# Patient Record
Sex: Female | Born: 1945 | Race: White | Hispanic: No | Smoking: Former smoker
Health system: Southern US, Community
[De-identification: ages and names within clinical notes are randomized; demographics above are authoritative.]

## PROBLEM LIST (undated history)

## (undated) DIAGNOSIS — J449 Chronic obstructive pulmonary disease, unspecified: Secondary | ICD-10-CM

## (undated) DIAGNOSIS — C189 Malignant neoplasm of colon, unspecified: Secondary | ICD-10-CM

## (undated) DIAGNOSIS — Q2112 Patent foramen ovale: Secondary | ICD-10-CM

## (undated) HISTORY — PX: OTHER SURGICAL HISTORY: SHX169

## (undated) HISTORY — PX: HERNIA REPAIR: SHX51

## (undated) HISTORY — DX: Patent foramen ovale: Q21.12

## (undated) HISTORY — PX: TONSILLECTOMY: SUR1361

---

## 2010-06-02 HISTORY — PX: ABDOMINAL HYSTERECTOMY: SHX81

## 2010-06-02 HISTORY — PX: DILATION AND CURETTAGE OF UTERUS: SHX78

## 2010-06-06 ENCOUNTER — Encounter (INDEPENDENT_AMBULATORY_CARE_PROVIDER_SITE_OTHER): Payer: Self-pay | Admitting: Obstetrics and Gynecology

## 2010-06-06 ENCOUNTER — Inpatient Hospital Stay (HOSPITAL_COMMUNITY)
Admission: RE | Admit: 2010-06-06 | Discharge: 2010-06-09 | Payer: Self-pay | Source: Home / Self Care | Attending: Obstetrics and Gynecology | Admitting: Obstetrics and Gynecology

## 2010-06-07 LAB — CBC
HCT: 35.6 % — ABNORMAL LOW (ref 36.0–46.0)
Hemoglobin: 11.8 g/dL — ABNORMAL LOW (ref 12.0–15.0)
MCH: 30 pg (ref 26.0–34.0)
MCHC: 33.1 g/dL (ref 30.0–36.0)
MCV: 90.6 fL (ref 78.0–100.0)
Platelets: 219 10*3/uL (ref 150–400)
RBC: 3.93 MIL/uL (ref 3.87–5.11)
RDW: 12.1 % (ref 11.5–15.5)
WBC: 15 10*3/uL — ABNORMAL HIGH (ref 4.0–10.5)

## 2010-06-08 IMAGING — CT CT ANGIO CHEST
1 series · 19 of 34 positions shown · IV contrast (OMNIPAQUE)
Comparison: Chest radiographs obtained earlier today.

CLINICAL DATA: Shortness of breath and hypoxia.  Status post
surgery for uterine prolapse on [DATE].  Ex-smoker.

CT ANGIOGRAPHY CHEST WITH CONTRAST
TECHNIQUE: Multidetector CT imaging of the chest was performed
using the standard protocol during bolus administration of
intravenous contrast.  Multiplanar CT image reconstructions
including MIPs were obtained to evaluate the vascular anatomy.
Contrast:  200 ml [UB].  100 ml was given twice due to
scanner failure during the first injection.

[Series 6: pe chest · axial · 0.65mm/px · z∈[-200,+100]mm · 19 of 162 slices shown]
[im 6/162  lung]
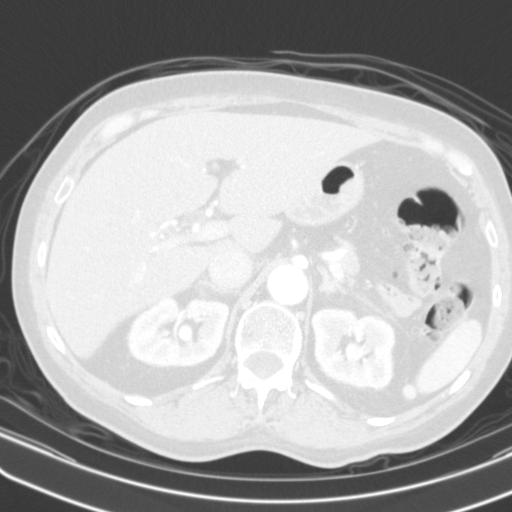
[im 18/162  mediastinal]
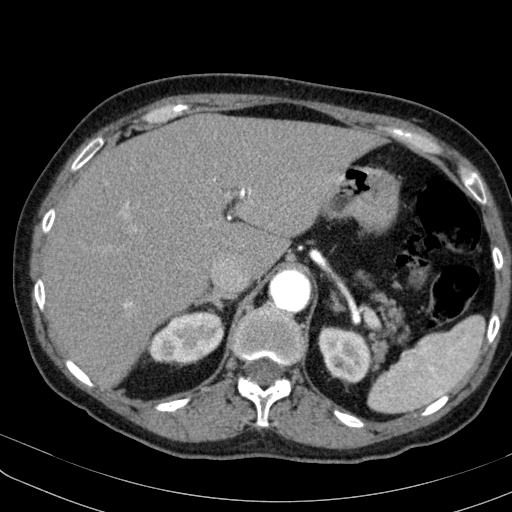
[im 30/162  lung]
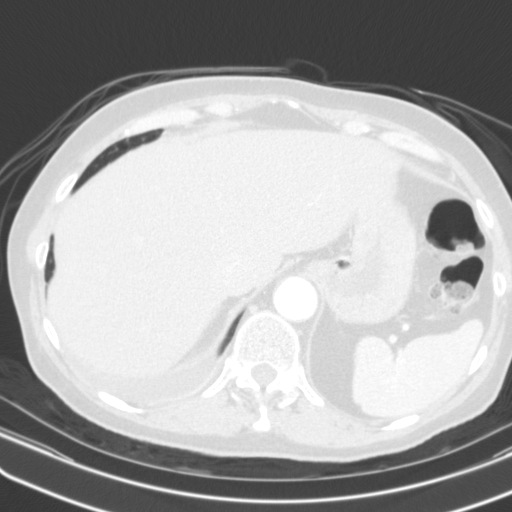
[im 33/162  mediastinal]
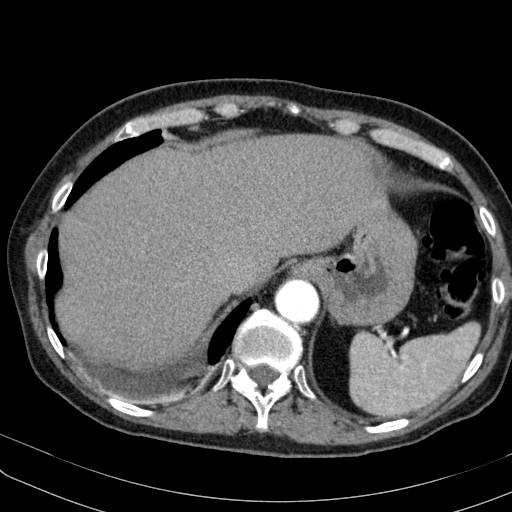
[im 42/162  lung]
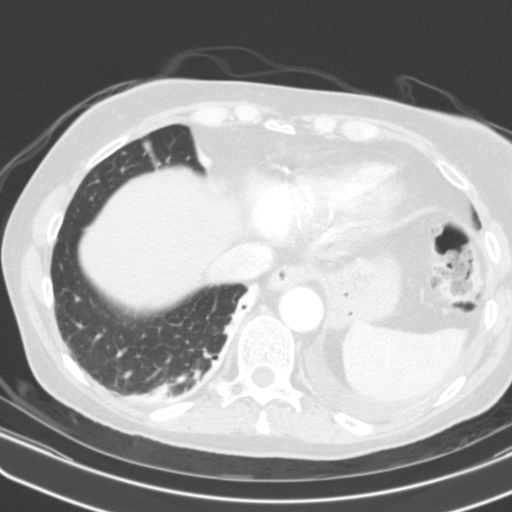
[im 54/162  mediastinal]
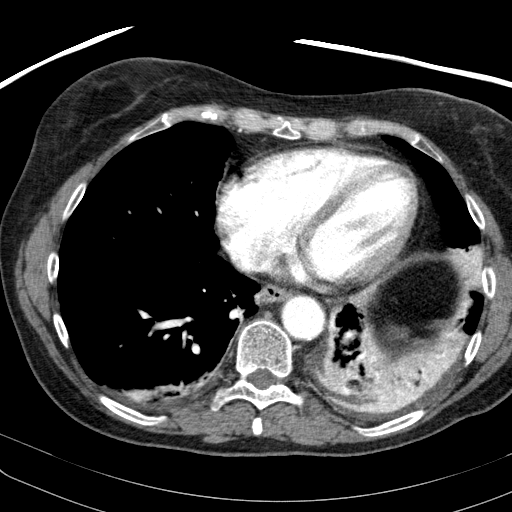
[im 60/162  lung]
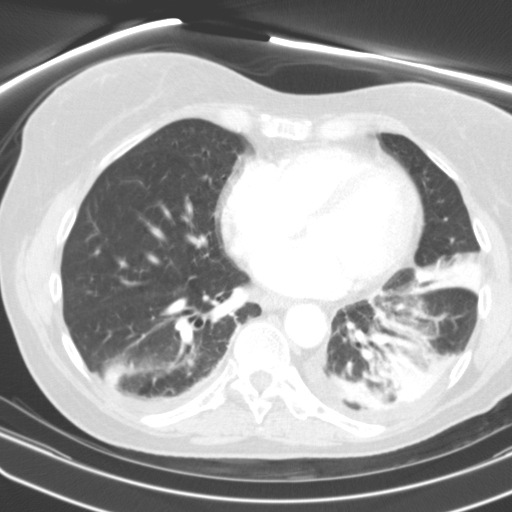
[im 66/162  mediastinal]
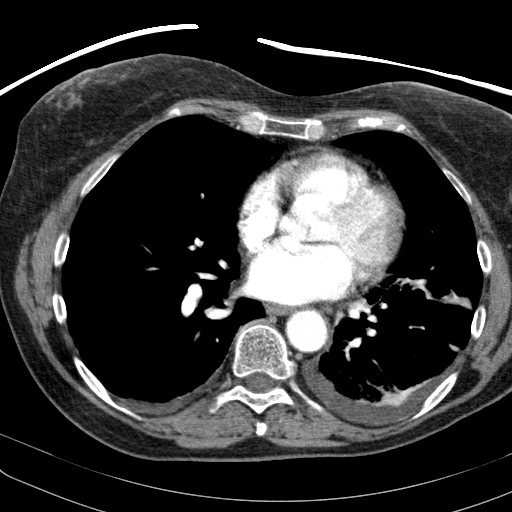
[im 77/162  lung]
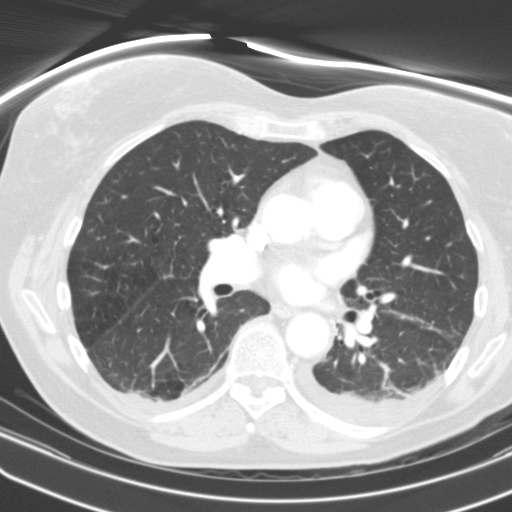
[im 84/162  mediastinal]
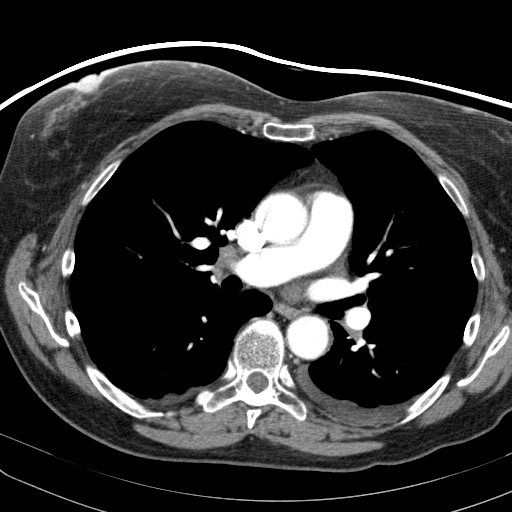
[im 86/162  lung]
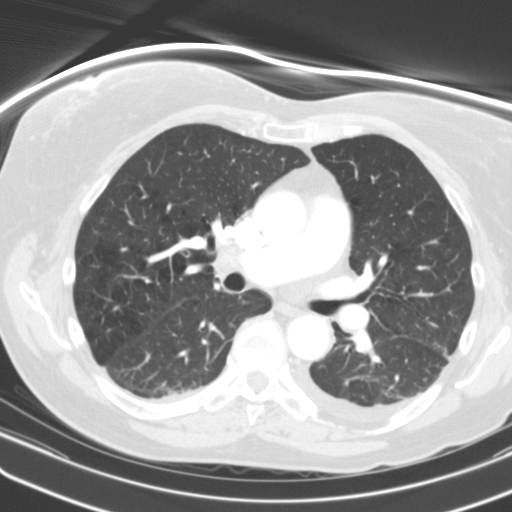
[im 96/162  mediastinal]
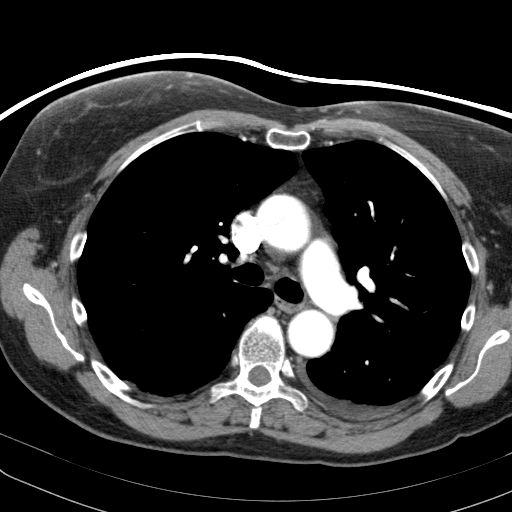
[im 102/162  lung]
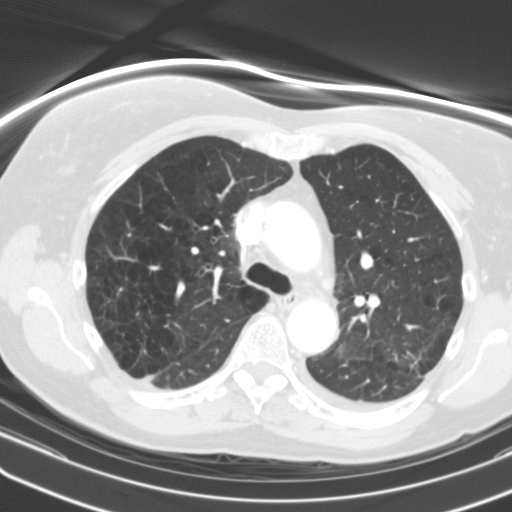
[im 108/162  mediastinal]
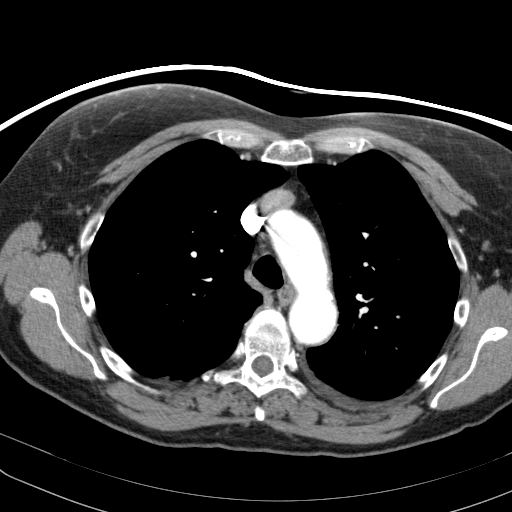
[im 120/162  lung]
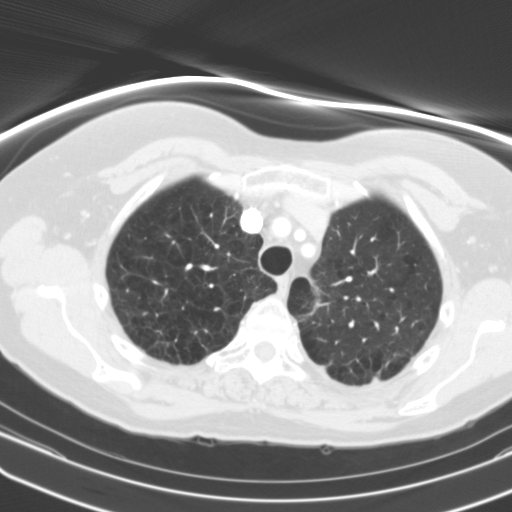
[im 129/162  mediastinal]
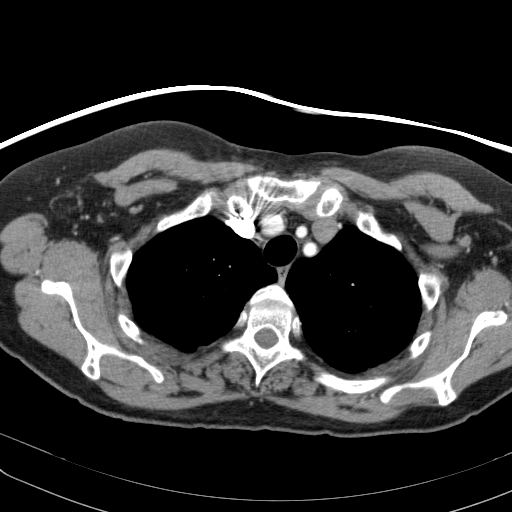
[im 132/162  lung]
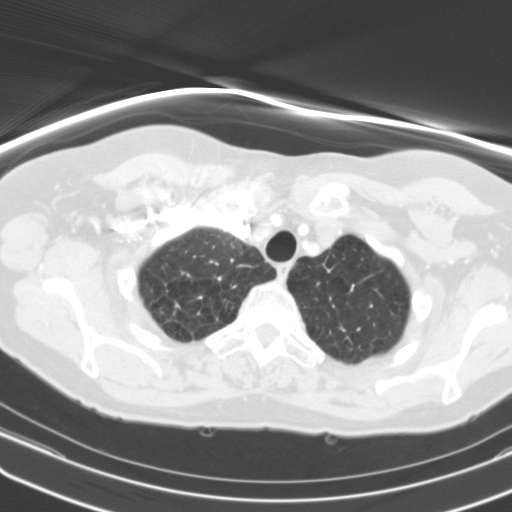
[im 144/162  mediastinal]
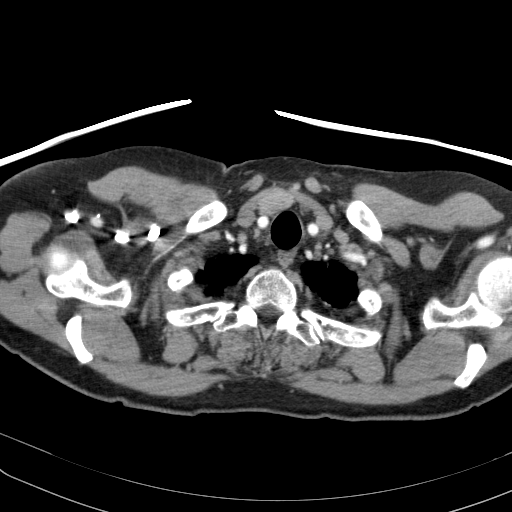
[im 156/162  lung]
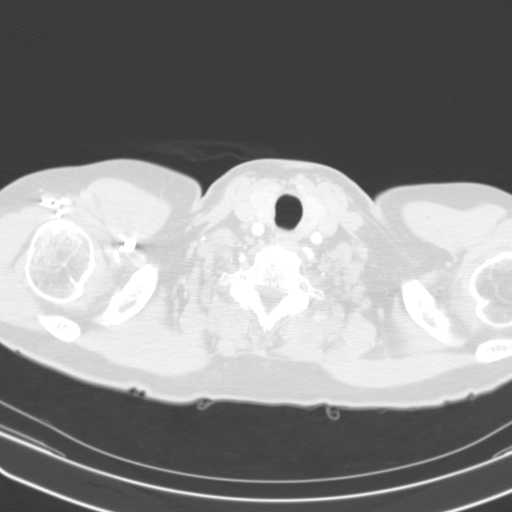

[19 of 34 positions shown; findings below may reference images not displayed]

FINDINGS: Normally opacified pulmonary arteries with no pulmonary
arterial filling defects seen.  Small bilateral pleural effusions,
larger on the left.  Bibasilar atelectasis, greater on the left.
Extensive bullous changes bilaterally, most pronounced in the upper
lobes.  No lung masses or enlarged lymph nodes seen.  Inhomogeneous
thyroid gland containing multiple poorly defined and better defined
nodules.  The largest individual nodule is poorly defined in the
right lobe near the isthmus, measuring 1.6 cm in maximum diameter
on image number 13.  Mild diffuse low density of the liver relative
to the spleen.  Mild thoracic spine degenerative changes.

Review of the MIP images confirms the above findings.
IMPRESSION: 1.  No pulmonary emboli.
2.  Small bilateral pleural effusions, larger on the left.
3.  Bibasilar atelectasis, greater on the left.
4.  COPD.
5.  Multiple thyroid nodules.  These could be better defined with
elective thyroid ultrasound.
6.  Mild hepatic steatosis.

## 2010-06-08 IMAGING — CR DG CHEST 2V
2 series · 2 of 2 positions shown · non-contrast
Comparison: None.

CLINICAL DATA: Uterine prolapse, shallow breathing, previous
tobacco use

CHEST - 2 VIEW

[view not recorded (1 of 2)]
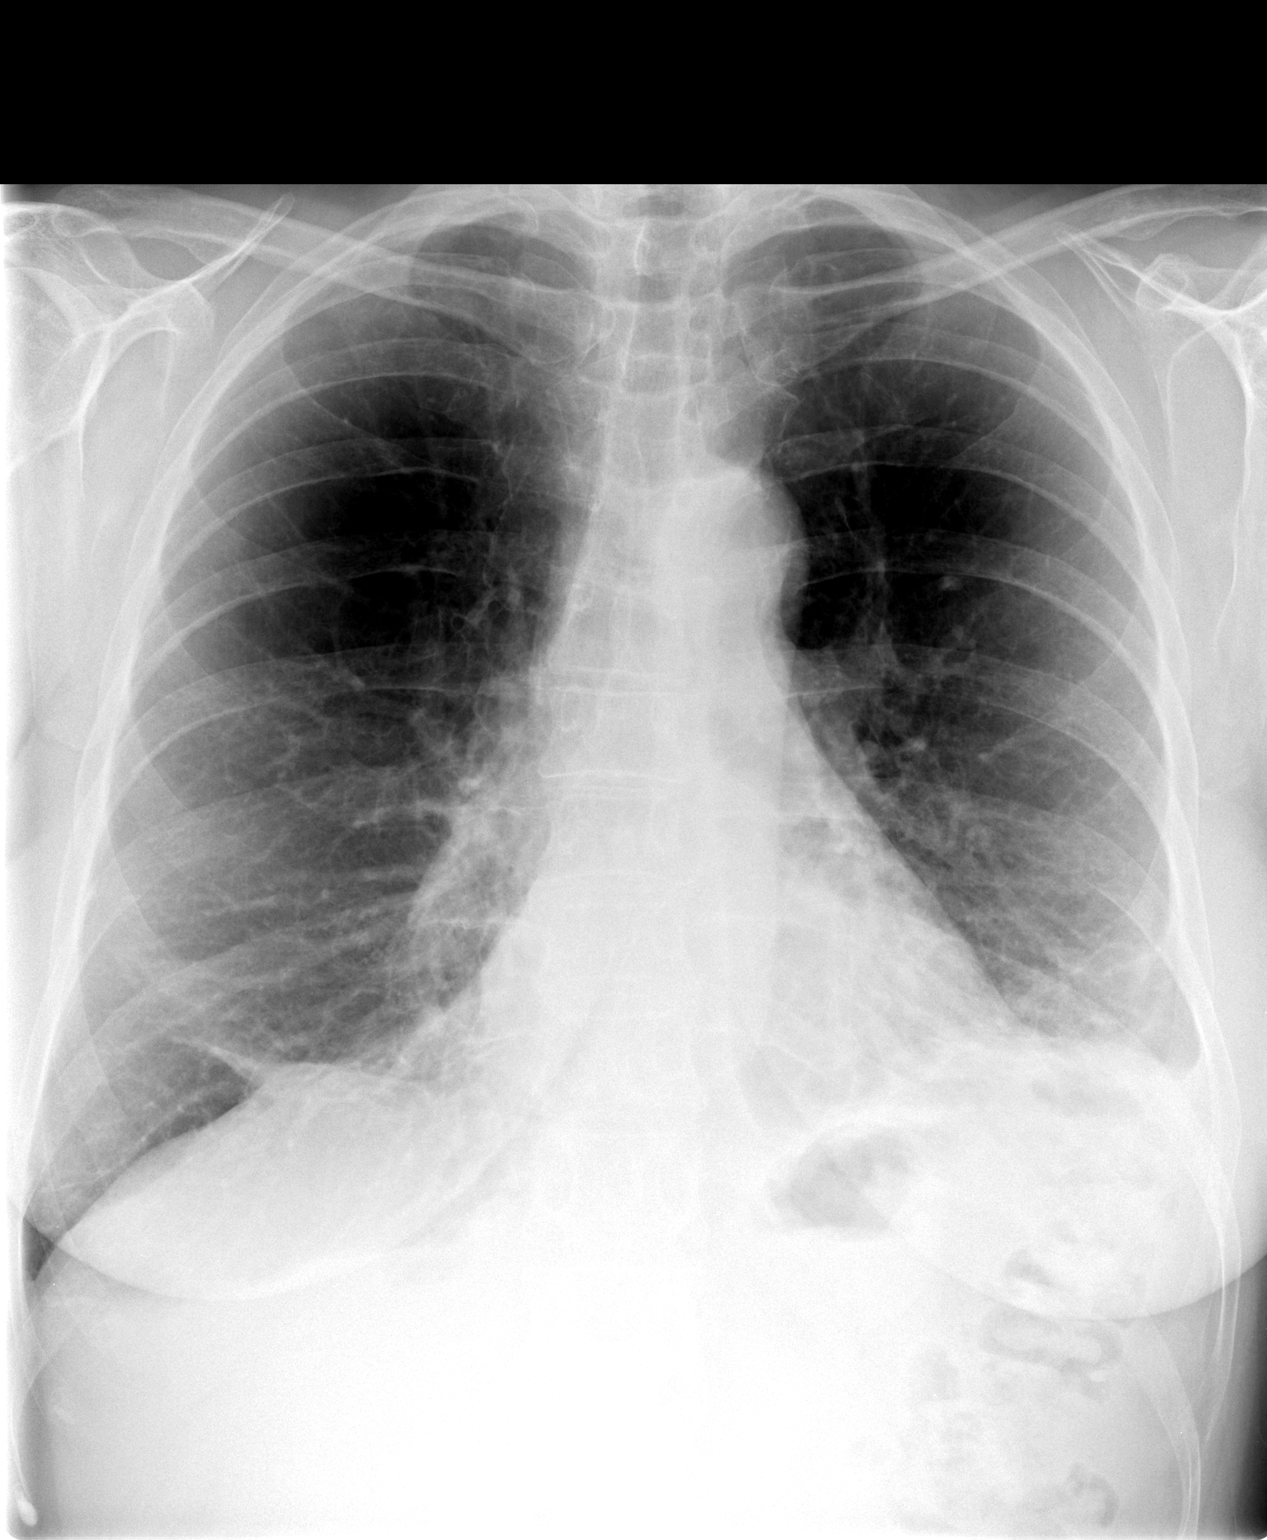

[view not recorded (2 of 2)]
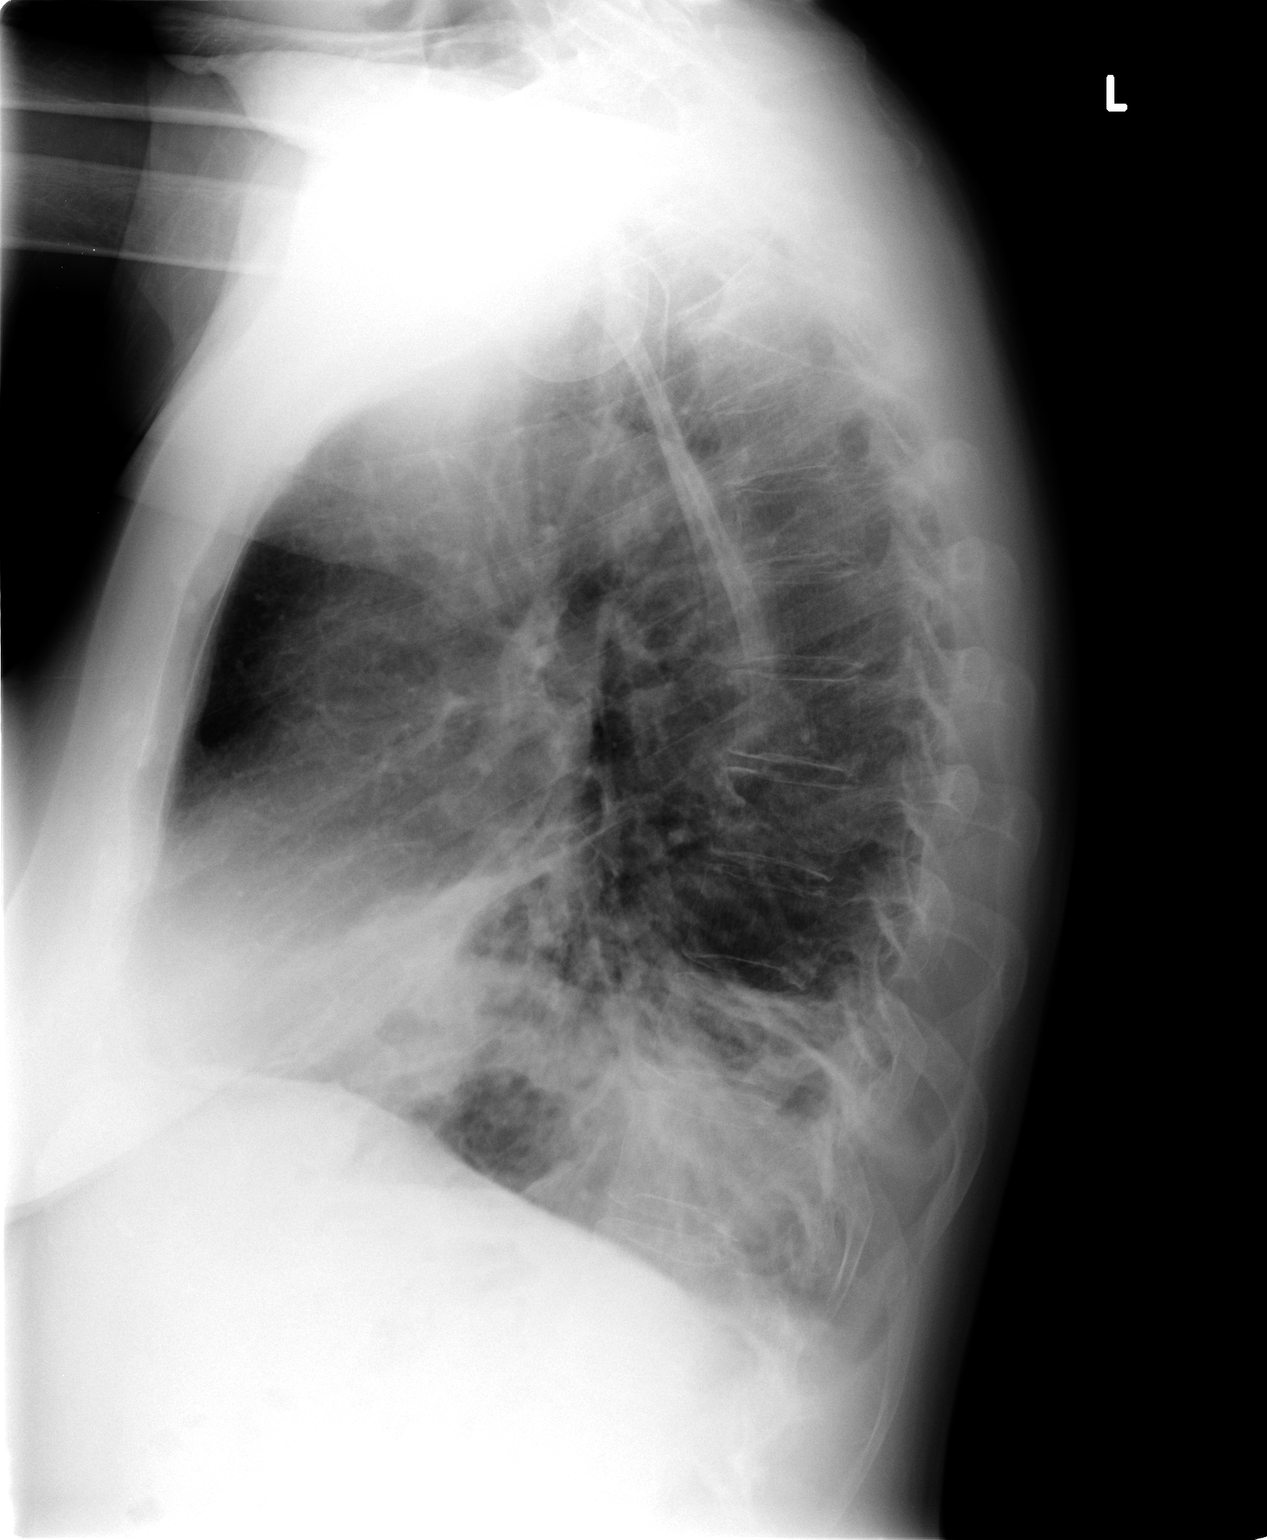

[2 of 2 positions shown; findings below may reference images not displayed]

FINDINGS: Small left pleural effusion.  Patchy consolidation /
atelectasis at the left lung in the left lower lobe.  Linear
subsegmental atelectasis or scarring in the posteromedial right
lower lobe.  Heart size normal.
IMPRESSION: 1.  Small left effusion with adjacent atelectasis or infiltrate in
the left lower lobe basilar segments.
2.  Linear scarring/atelectasis at the right lung base.

## 2010-06-09 IMAGING — CR DG CHEST 2V
2 series · 2 of 2 positions shown · non-contrast
Comparison: [DATE]

CLINICAL DATA: Evaluate pleural effusion

CHEST - 2 VIEW

[view not recorded (1 of 2)]
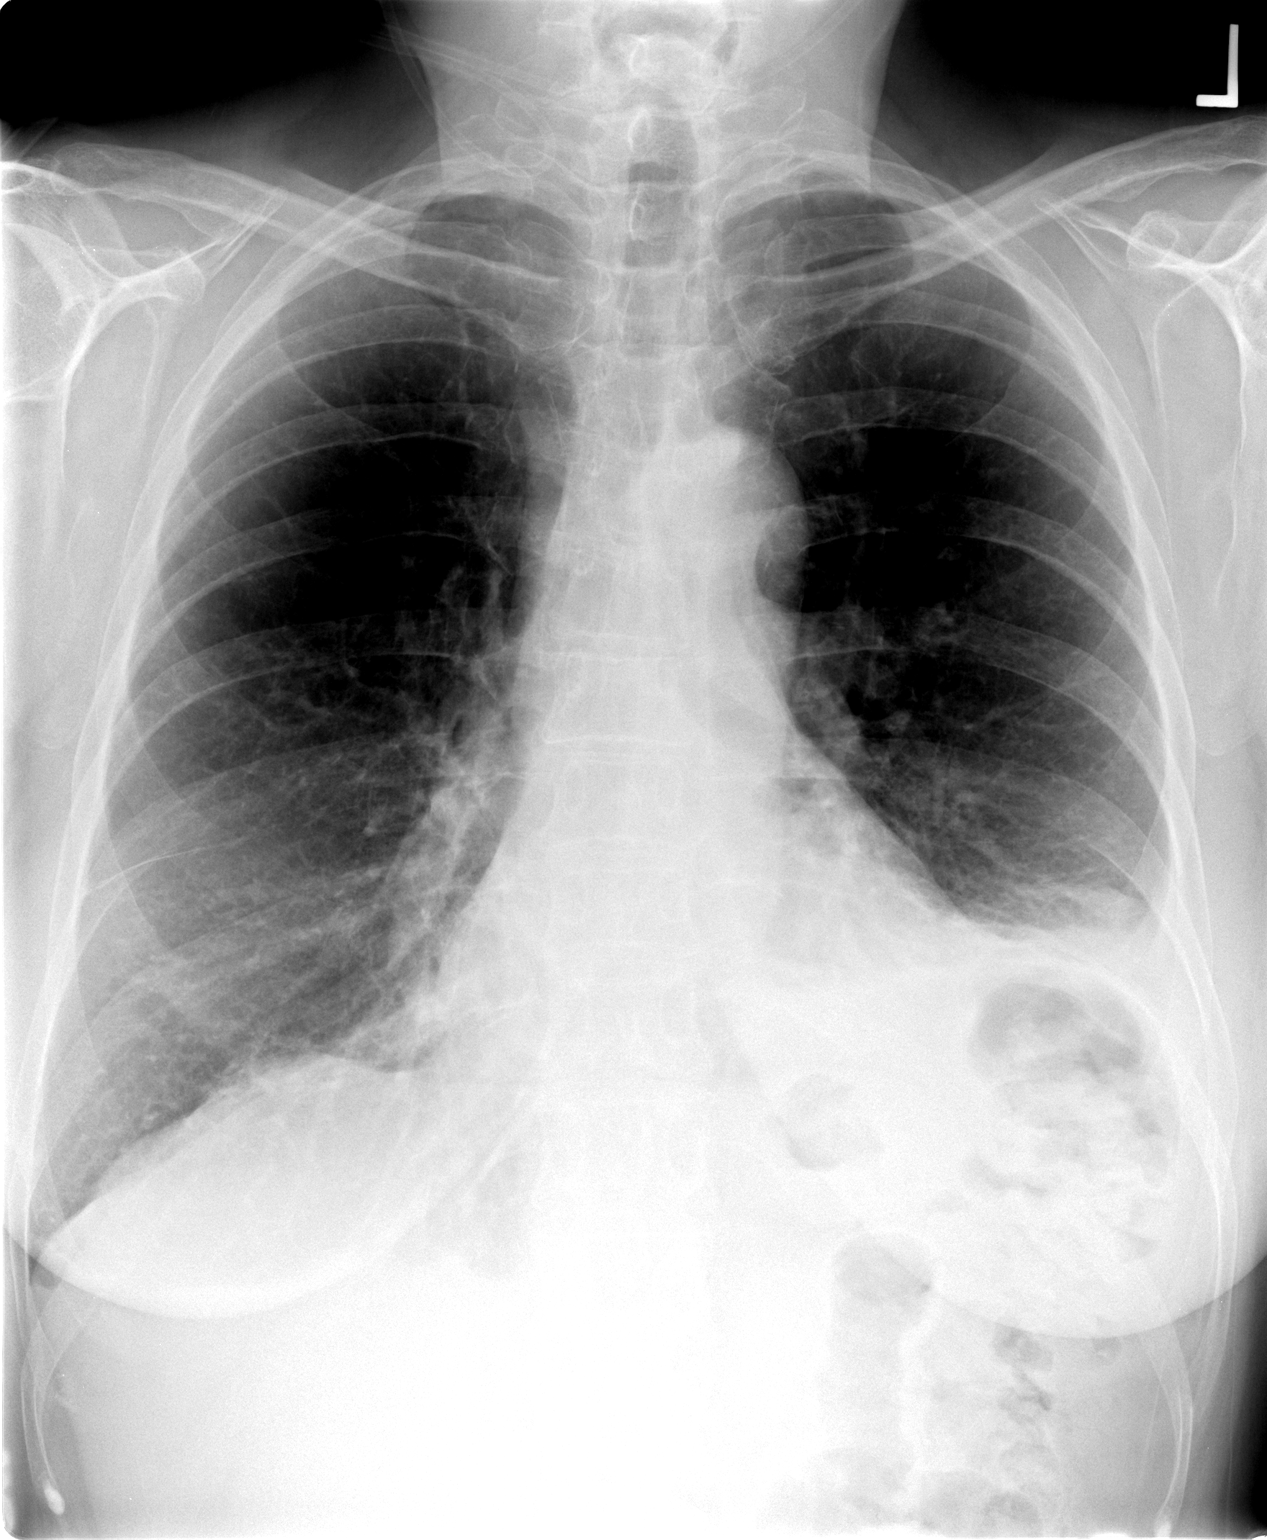

[view not recorded (2 of 2)]
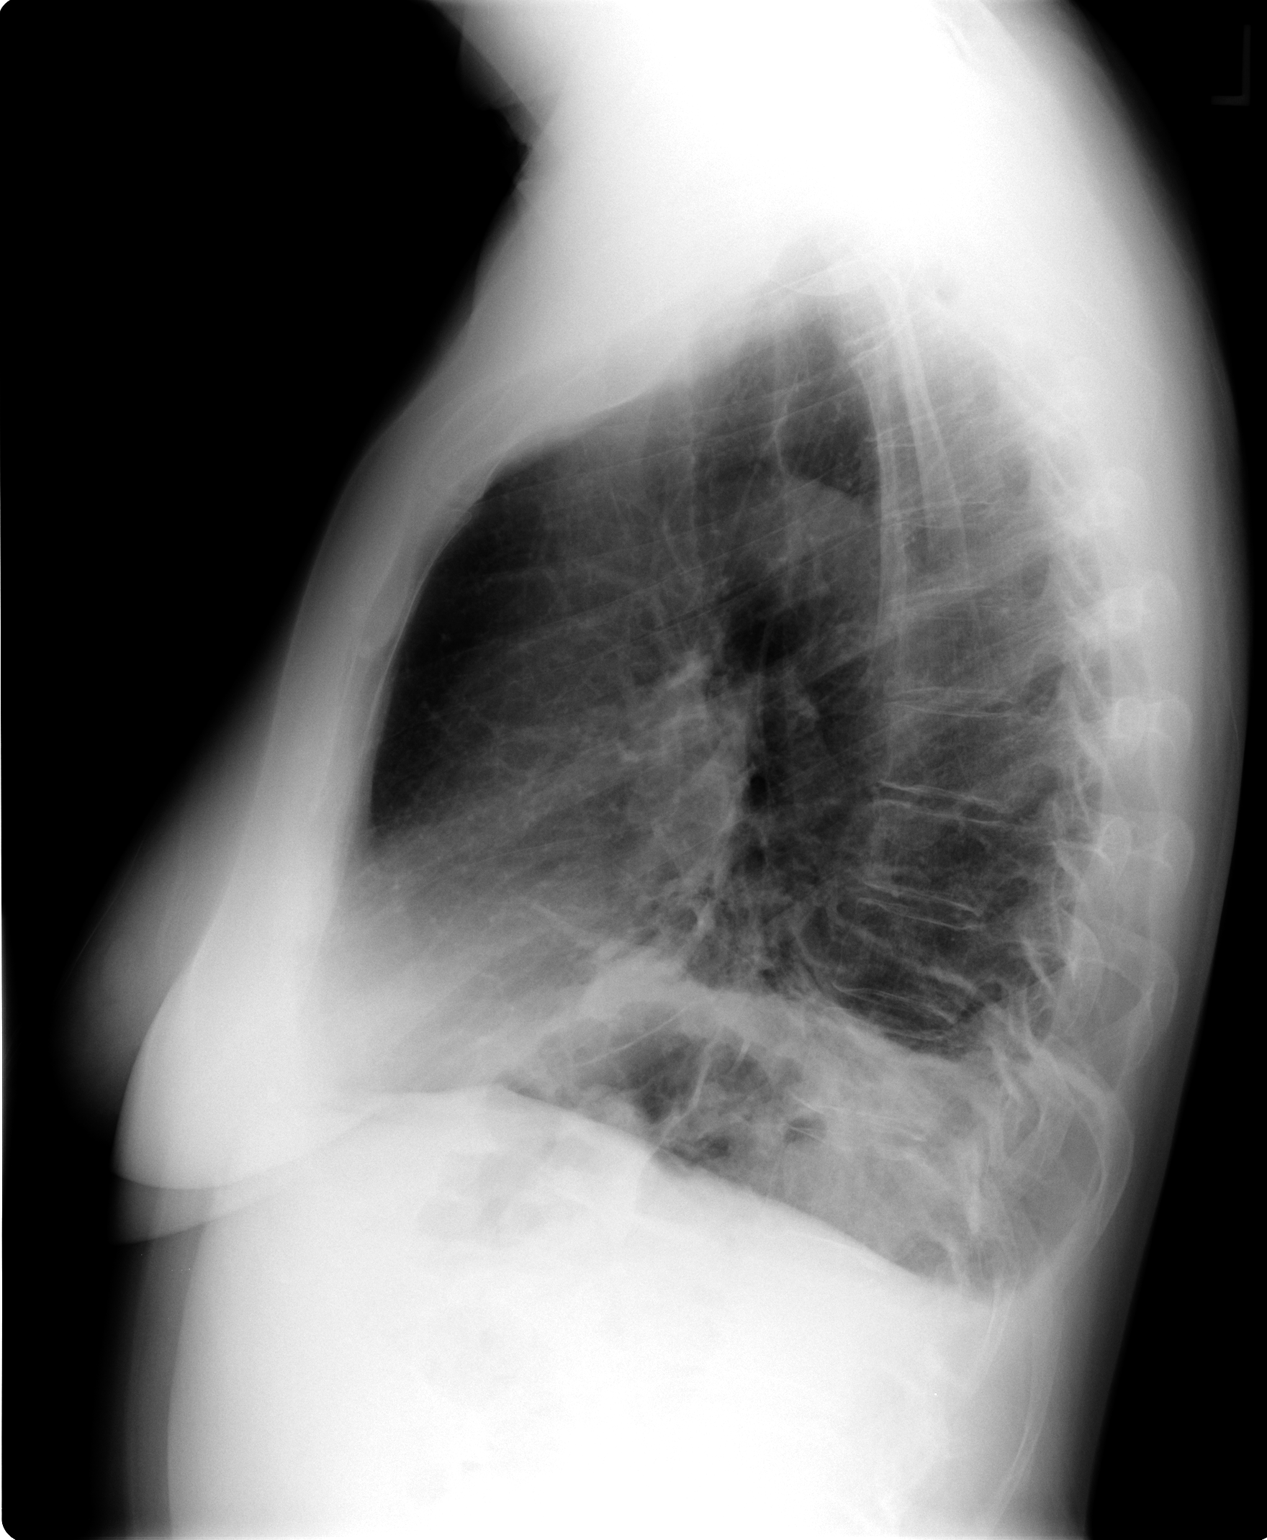

[2 of 2 positions shown; findings below may reference images not displayed]

FINDINGS: Heart size is normal.  Bilateral pleural effusions, left greater
than right, are again noted and appear unchanged from previous
exam.

Airspace consolidation and atelectasis within the left base is
unchanged.  There is mild atelectasis in the right base.  No new
findings identified.
IMPRESSION: 1.  No change in small effusions
2.  Aeration to the lung base stable.

## 2010-06-17 LAB — CREATININE, SERUM
Creatinine, Ser: 0.71 mg/dL (ref 0.4–1.2)
GFR calc Af Amer: >60 mL/min (ref >60)
GFR calc non Af Amer: >60 mL/min (ref >60)

## 2010-06-17 LAB — BUN: BUN: 10 mg/dL (ref 6–23)

## 2010-06-21 NOTE — Consult Note (Signed)
Kelly [name], [name]              ACCOUNT NO.:  [id number]  MEDICAL RECORD NO.:  [id number]          PATIENT TYPE:  INP  LOCATION:  9316                          FACILITY:  [facility]  PHYSICIAN:  Kelly Balm. Sung Amabile, MD   DATE OF BIRTH:  03/27/1946  DATE OF CONSULTATION:  06/09/2010 DATE OF DISCHARGE:  06/09/2010                                CONSULTATION   REQUESTING PHYSICIAN:  Dineen Kid. Kelly Snare, MD  REASON FOR CONSULTATION:  Postoperative hypoxemia.  HISTORY OF PRESENT ILLNESS:  Ms. Kelly [name] is a 65 year old woman who was admitted electively on June 06, 2010, for surgical repair of prolapsed uterus.  She underwent laparoscopic-assisted vaginal hysterectomy, bilateral salpingo-oophorectomy, anterior and posterior colporrhaphy, and sacrospinous vaginal vault suspension.  This was performed by Dr. Rana [name], and the procedure was uncomplicated.  On postoperative day #0, she was ambulating.  On the first day postoperatively, she noted no significant symptoms, but she was noted to require oxygen to maintain saturations greater than 90%.  In particular, while ambulating her oxygen saturations were noted to drop.  On postoperative day #2, she developed some left-sided chest discomfort.  She underwent a CT scan of the chest to rule out pulmonary embolism and the results are discussed below.  She has remained oxygen-dependent since her surgery and consequently Pulmonary Medicine is asked to evaluate her.  At the present time, she has no significant complaints of respiratory distress. She denies further chest pain, pressure tightness, or heaviness.  She has had no productive cough and no significant fevers.  She denies lower extremity edema and calf tenderness.  She does have the expected postoperative pelvic and abdominal pain.  Otherwise, a detailed review of systems is negative.  PAST MEDICAL HISTORY:  She has no chronic medical problems and takes no medications on a regular basis.  She has  undergone inguinal hernia repair and tonsillectomy.  Specifically, there is no history of respiratory disease noted.  She has had no hospitalizations for any pulmonary problems.  SOCIAL HISTORY:  She is a former Engineer, site.  Presently works at [employer]  She lives independently.  She smoked a pack of cigarettes per day for 40 years and quit approximately 2 years ago.  She has no history of significant occupational or environmental exposures.  FAMILY HISTORY:  This has been reviewed as noncontributory.  REVIEW OF SYSTEMS:  As per the history present illness.  PHYSICAL EXAMINATION:  VITAL SIGNS:  Afebrile with normal vital signs, oxygen saturations are 97% on 3 liters by nasal cannula.  When oxygen is removed, her oxygen saturations vary between 85% and 94%.  She can be coached to take several deep breaths which markedly improved her saturations by pulse oximetry. GENERAL:  She is well developed, well nourished in no acute distress. HEENT:  Head and neck exam reveal no acute abnormalities.  There is no jugular venous distention and no lymphadenopathy noted.  Thyroid gland is normal. CHEST:  Reveals a normal percussion note throughout.  Breath sounds are full with a faint, left greater than right, bibasilar crackles.  No wheezes are heard. CARDIAC:  Reveals a regular rate and rhythm with no  murmurs. ABDOMEN:  Soft with lower abdominal tenderness to palpation that is mild.  There is no rebound or tenderness.  Bowel sounds present. EXTREMITIES:  Reveal no clubbing, cyanosis, or edema.  There is no calf tenderness. NEUROLOGIC:  Fully intact.  DATA:  Chest x-ray from June 08, 2010, reveals faint bilateral basilar atelectasis.  Otherwise no acute cardiac or pulmonary disease.  CT scan of the chest performed on June 08, 2010, reveals very small bilateral pleural effusions with bilateral atelectasis and moderate bullous emphysema.  No pulmonary emboli are noted.  IMPRESSION:   Persistent postoperative hypoxemia - this is multifactorial and mostly related to her previously unrecognized emphysema. Superimposed on that is the problem of atelectasis with very, very small pleural effusions.  I also think there is a component of depressed ventilatory drive related to opioid analgesics being used for postoperative pain.  Although, her oxygen saturations are in the mid 80s at times and we would not want her to live with saturations for prolonged period of time, in the short-term that should be of no consequence to her and I think her oxygen saturations will normalize over the next couple of days.  PLAN/RECOMMENDATIONS: 1. She is okay for discharge to home and I do not believe that she     needs oxygen therapy. 2. The best therapy for her is to be ambulating as much as possible     and I discussed this with her. 3. Encourage that she continues to use the incentive spirometer as she     has been previously taught.  She should take this device home and     use it several times throughout the day. 4. I have encouraged that she minimize the use of Percocet or other     opioid analgesics to avoid respiratory depression related induced-     medications. 5. She should follow up her with her primary physician in [facility],     Dr. Alwyn Ren, and upon followup she should receive a repeat chest     x-ray with Dr. Alwyn Ren.  Upon followup, she should receive a     repeat chest x-ray to ensure resolution of the bilateral basilar     atelectasis.  Also recommend a recheck of her oxygen saturations to     ensure that these normalized and to be certain that she does not     require long-term oxygen therapy.  Lastly, given the finding of     emphysema, I recommend full pulmonary function tests.  If she were     to require further pulmonary evaluation or management, my group     would be happy to see her at any time.  If this is desired, please     call [place] [facility] at [phone].     Kelly Balm Sung Amabile, MD     DBS/MEDQ  D:  06/09/2010  T:  06/09/2010  Job:  [id number]  cc:   Doreatha Martin, M.D.  Electronically Signed by Billy Fischer MD on 06/21/2010 11:33:18 AM

## 2010-08-12 LAB — CBC
HCT: 42.6 % (ref 36.0–46.0)
Hemoglobin: 13.7 g/dL (ref 12.0–15.0)
MCH: 29.1 pg (ref 26.0–34.0)
MCHC: 32.2 g/dL (ref 30.0–36.0)
MCV: 90.4 fL (ref 78.0–100.0)
Platelets: 240 10*3/uL (ref 150–400)
RBC: 4.71 MIL/uL (ref 3.87–5.11)
RDW: 12.1 % (ref 11.5–15.5)
WBC: 8.9 10*3/uL (ref 4.0–10.5)

## 2010-08-12 LAB — SURGICAL PCR SCREEN
MRSA, PCR: NEGATIVE
Staphylococcus aureus: POSITIVE — AB

## 2015-10-24 ENCOUNTER — Emergency Department (HOSPITAL_COMMUNITY): Payer: Medicare Other

## 2015-10-24 ENCOUNTER — Encounter (HOSPITAL_COMMUNITY): Payer: Self-pay | Admitting: Emergency Medicine

## 2015-10-24 ENCOUNTER — Inpatient Hospital Stay (HOSPITAL_COMMUNITY)
Admission: EM | Admit: 2015-10-24 | Discharge: 2015-10-27 | DRG: 418 | Disposition: A | Discharge status: Home / Self Care | Payer: Medicare Other | Attending: Internal Medicine | Admitting: Internal Medicine

## 2015-10-24 DIAGNOSIS — J439 Emphysema, unspecified: Secondary | ICD-10-CM

## 2015-10-24 DIAGNOSIS — K838 Other specified diseases of biliary tract: Secondary | ICD-10-CM

## 2015-10-24 DIAGNOSIS — Z419 Encounter for procedure for purposes other than remedying health state, unspecified: Secondary | ICD-10-CM

## 2015-10-24 DIAGNOSIS — Z87891 Personal history of nicotine dependence: Secondary | ICD-10-CM | POA: Diagnosis not present

## 2015-10-24 DIAGNOSIS — K851 Biliary acute pancreatitis without necrosis or infection: Secondary | ICD-10-CM | POA: Diagnosis not present

## 2015-10-24 DIAGNOSIS — Z791 Long term (current) use of non-steroidal anti-inflammatories (NSAID): Secondary | ICD-10-CM | POA: Diagnosis not present

## 2015-10-24 DIAGNOSIS — K8050 Calculus of bile duct without cholangitis or cholecystitis without obstruction: Secondary | ICD-10-CM | POA: Diagnosis not present

## 2015-10-24 DIAGNOSIS — Z882 Allergy status to sulfonamides status: Secondary | ICD-10-CM

## 2015-10-24 DIAGNOSIS — K859 Acute pancreatitis without necrosis or infection, unspecified: Secondary | ICD-10-CM

## 2015-10-24 DIAGNOSIS — Z90710 Acquired absence of both cervix and uterus: Secondary | ICD-10-CM | POA: Diagnosis not present

## 2015-10-24 DIAGNOSIS — J449 Chronic obstructive pulmonary disease, unspecified: Secondary | ICD-10-CM | POA: Diagnosis not present

## 2015-10-24 DIAGNOSIS — K8066 Calculus of gallbladder and bile duct with acute and chronic cholecystitis without obstruction: Secondary | ICD-10-CM | POA: Diagnosis not present

## 2015-10-24 DIAGNOSIS — R1011 Right upper quadrant pain: Secondary | ICD-10-CM | POA: Diagnosis present

## 2015-10-24 DIAGNOSIS — K8020 Calculus of gallbladder without cholecystitis without obstruction: Secondary | ICD-10-CM

## 2015-10-24 DIAGNOSIS — K8051 Calculus of bile duct without cholangitis or cholecystitis with obstruction: Secondary | ICD-10-CM | POA: Diagnosis not present

## 2015-10-24 HISTORY — DX: Calculus of bile duct without cholangitis or cholecystitis without obstruction: K80.50

## 2015-10-24 HISTORY — DX: Biliary acute pancreatitis without necrosis or infection: K85.10

## 2015-10-24 HISTORY — DX: Chronic obstructive pulmonary disease, unspecified: J44.9

## 2015-10-24 LAB — CBC
HCT: 38.7 % (ref 36.0–46.0)
Hemoglobin: 12.9 g/dL (ref 12.0–15.0)
MCH: 29.3 pg (ref 26.0–34.0)
MCHC: 33.3 g/dL (ref 30.0–36.0)
MCV: 87.8 fL (ref 78.0–100.0)
Platelets: 274 10*3/uL (ref 150–400)
RBC: 4.41 MIL/uL (ref 3.87–5.11)
RDW: 12.5 % (ref 11.5–15.5)
WBC: 14.3 10*3/uL — ABNORMAL HIGH (ref 4.0–10.5)

## 2015-10-24 LAB — LIPASE, BLOOD: Lipase: 747 U/L — ABNORMAL HIGH (ref 11–51)

## 2015-10-24 LAB — COMPREHENSIVE METABOLIC PANEL
ALT: 53 U/L (ref 14–54)
AST: 78 U/L — ABNORMAL HIGH (ref 15–41)
Albumin: 3.9 g/dL (ref 3.5–5.0)
Alkaline Phosphatase: 76 U/L (ref 38–126)
Anion gap: 6 (ref 5–15)
BUN: 16 mg/dL (ref 6–20)
CO2: 26 mmol/L (ref 22–32)
Calcium: 8.8 mg/dL — ABNORMAL LOW (ref 8.9–10.3)
Chloride: 108 mmol/L (ref 101–111)
Creatinine, Ser: 1.07 mg/dL — ABNORMAL HIGH (ref 0.44–1.00)
GFR calc Af Amer: >60 mL/min (ref >60)
GFR calc non Af Amer: 52 mL/min — ABNORMAL LOW (ref >60)
Glucose, Bld: 151 mg/dL — ABNORMAL HIGH (ref 65–99)
Potassium: 4 mmol/L (ref 3.5–5.1)
Sodium: 140 mmol/L (ref 135–145)
Total Bilirubin: 1.4 mg/dL — ABNORMAL HIGH (ref 0.3–1.2)
Total Protein: 6.7 g/dL (ref 6.5–8.1)

## 2015-10-24 LAB — URINALYSIS, ROUTINE W REFLEX MICROSCOPIC
Bilirubin Urine: NEGATIVE
Glucose, UA: NEGATIVE mg/dL
Hgb urine dipstick: NEGATIVE
Ketones, ur: NEGATIVE mg/dL
Nitrite: POSITIVE — AB
Protein, ur: NEGATIVE mg/dL
Specific Gravity, Urine: 1.018 (ref 1.005–1.030)
pH: 6.5 (ref 5.0–8.0)

## 2015-10-24 LAB — URINE MICROSCOPIC-ADD ON: RBC / HPF: NONE SEEN RBC/hpf (ref 0–5)

## 2015-10-24 MED ORDER — MORPHINE SULFATE (PF) 4 MG/ML IV SOLN
4.0000 mg | Freq: Once | INTRAVENOUS | Status: AC
  Administered 2015-10-24: 4 mg via INTRAVENOUS
  Filled 2015-10-24: qty 1

## 2015-10-24 MED ORDER — ONDANSETRON HCL 4 MG/2ML IJ SOLN: 4.0000 mg | Freq: Four times a day (QID) | INTRAMUSCULAR | Status: DC | PRN

## 2015-10-24 MED ORDER — PIPERACILLIN-TAZOBACTAM 3.375 G IVPB 30 MIN
3.3750 g | Freq: Once | INTRAVENOUS | Status: AC
  Administered 2015-10-24: 3.375 g via INTRAVENOUS
  Filled 2015-10-24: qty 50

## 2015-10-24 MED ORDER — MORPHINE SULFATE (PF) 2 MG/ML IV SOLN
2.0000 mg | INTRAVENOUS | Status: DC | PRN
Start: 2015-10-24 — End: 2015-10-26

## 2015-10-24 MED ORDER — FENTANYL CITRATE (PF) 100 MCG/2ML IJ SOLN
50.0000 ug | INTRAMUSCULAR | Status: DC | PRN
  Administered 2015-10-24: 50 ug via INTRAVENOUS
  Filled 2015-10-24: qty 2

## 2015-10-24 MED ORDER — ALBUTEROL SULFATE (2.5 MG/3ML) 0.083% IN NEBU: 2.5000 mg | INHALATION_SOLUTION | RESPIRATORY_TRACT | Status: DC | PRN

## 2015-10-24 MED ORDER — SODIUM CHLORIDE 0.9 % IV SOLN
INTRAVENOUS | Status: DC
  Administered 2015-10-24 – 2015-10-26 (×5): via INTRAVENOUS

## 2015-10-24 MED ORDER — OXYCODONE HCL 5 MG PO TABS
5.0000 mg | ORAL_TABLET | ORAL | Status: DC | PRN
  Filled 2015-10-24: qty 1

## 2015-10-24 MED ORDER — ONDANSETRON HCL 4 MG PO TABS: 4.0000 mg | ORAL_TABLET | Freq: Four times a day (QID) | ORAL | Status: DC | PRN

## 2015-10-24 MED ORDER — PIPERACILLIN-TAZOBACTAM 3.375 G IVPB
3.3750 g | Freq: Three times a day (TID) | INTRAVENOUS | Status: DC
  Administered 2015-10-25 – 2015-10-26 (×6): 3.375 g via INTRAVENOUS
  Filled 2015-10-24 (×8): qty 50

## 2015-10-24 NOTE — Consult Note (Signed)
Referring Provider: No ref. provider found Primary Care Physician:  Suzanna Obey, MD Primary Gastroenterologist:  Althia Forts  Reason for Consultation:  Gallstone pancreatitis  HPI: Kelly [name] is a 70 y.o. female with medical history significant of COPD, who presented to the ED today with complaints of persistent but worsening right upper quadrant abdominal pain since she woke up this morning. Pain is described as colicky, [id number] at its worst without any radiation. No particular aggravating or relieving factors. Pain is associated with 2- 3 episodes of nonbilious and nonbloody vomiting. Further workup in the ED revealed choledocholithiasis and gallstone pancreatitis.  Interestingly, for the past 1 year or so, patient has had occasional right upper quadrant abdominal pain that lasts for 10-15 minutes and resolves on its own.  She says that she actually has episodes of this almost weekly.  ED Course: Found to have lipase of 747, leukocytosis of 14,000, abdominal ultrasound showed a dilated CBD of 16.4 mm with evidence of choledocholithiasis.  She is being admitted to the hospitalist service and started on antibiotics.   History reviewed. No pertinent past medical history.  History reviewed. No pertinent past surgical history.  Prior to Admission medications   Medication Sig Start Date End Date Taking? Authorizing Provider  Biotin 5 MG TABS Take 5 mg by mouth daily.   Yes Historical Provider, MD    Current Facility-Administered Medications  Medication Dose Route Frequency Provider Last Rate Last Dose  . fentaNYL (SUBLIMAZE) injection 50 mcg  50 mcg Intravenous Q20 Min PRN Davonna Belling, MD   50 mcg at 10/24/15 1356   Current Outpatient Prescriptions  Medication Sig Dispense Refill  . Biotin 5 MG TABS Take 5 mg by mouth daily.      Allergies as of 10/24/2015 - Review Complete 10/24/2015  Allergen Reaction Noted  . Sulfa antibiotics Rash 10/24/2015    No family history on  file.  Social History   Social History  . Marital Status: Married    Spouse Name: N/A  . Number of Children: N/A  . Years of Education: N/A   Occupational History  . Not on file.   Social History Main Topics  . Smoking status: Former Smoker    Quit date: 06/02/2009  . Smokeless tobacco: Not on file  . Alcohol Use: Not on file  . Drug Use: Not on file  . Sexual Activity: Not on file   Other Topics Concern  . Not on file   Social History Narrative  . No narrative on file    Review of Systems: Ten point ROS is O/W negative except as mentioned in HPI.  Physical Exam: Vital signs in last 24 hours: Temp:  [97.7 F (36.5 C)] 97.7 F (36.5 C) (05/24 1348) Pulse Rate:  [68] 68 (05/24 1348) Resp:  [18] 18 (05/24 1348) BP: (142)/(72) 142/72 mmHg (05/24 1348) SpO2:  [94 %] 94 % (05/24 1348)   General:  Alert, Well-developed, well-nourished, pleasant and cooperative in NAD Head:  Normocephalic and atraumatic. Eyes:  Sclera clear, no icterus.  Conjunctiva pink. Ears:  Normal auditory acuity. Mouth:  No deformity or lesions.   Lungs:  Clear throughout to auscultation.  No wheezes, crackles, or rhonchi.  Heart:  Regular rate and rhythm; no murmurs, clicks, rubs, or gallops. Abdomen:  Soft, non-distended.  BS present.  Mild epigastric TTP.   Rectal:  Deferred.  Msk:  Symmetrical without gross deformities. Pulses:  Normal pulses noted. Extremities:  Without clubbing or edema. Neurologic:  Alert and oriented  x 4;  grossly normal neurologically. Skin:  Intact without significant lesions or rashes. Psych:  Alert and cooperative. Normal mood and affect.  Lab Results:  Recent Labs  10/24/15 1359  WBC 14.3*  HGB 12.9  HCT 38.7  PLT 274   BMET  Recent Labs  10/24/15 1359  NA 140  K 4.0  CL 108  CO2 26  GLUCOSE 151*  BUN 16  CREATININE 1.07*  CALCIUM 8.8*   LFT  Recent Labs  10/24/15 1359  PROT 6.7  ALBUMIN 3.9  AST 78*  ALT 53  ALKPHOS 76  BILITOT  1.4*   Studies/Results: US Abdomen Complete  10/24/2015  CLINICAL DATA:  Right upper quadrant pain for 1 day EXAM: ABDOMEN ULTRASOUND COMPLETE COMPARISON:  None. FINDINGS: Gallbladder: No cholelithiasis. Distended gallbladder. Trace amount of pericholecystic fluid. Normal gallbladder wall thickness measuring 1.5 mm. Common bile duct: Diameter: Dilated common bile duct measuring 16.4 mm with an 8 mm echogenic focus within the common bile duct concerning for choledocholithiasis. Liver: No focal lesion identified. Increased hepatic parenchymal echogenicity. IVC: No abnormality visualized. Pancreas: Visualized portion unremarkable. Spleen: Size and appearance within normal limits. Right Kidney: Length: 10.4 cm. Mild renal cortical thinning. Echogenicity within normal limits. No mass or hydronephrosis visualized. Left Kidney: Length: 10.9 cm. Echogenicity within normal limits. 1.9 x 1.1 x 2.4 cm and anechoic left renal mass most consistent with a cyst. No hydronephrosis visualized. Abdominal aorta: No aneurysm visualized. Other findings: None. IMPRESSION: 1. Dilated common bile duct measuring 16.4 mm with an 8 mm echogenic focus within the common bile duct concerning for choledocholithiasis. 2. Distended gallbladder with a trace amount of pericholecystic fluid. No cholelithiasis. Electronically Signed   By: Kathreen Devoid   On: 10/24/2015 15:53   IMPRESSION:  -70 year old female with what appears to be gallstone pancreatitis with a lipase of 747 and ultrasound showing common bile duct measuring 16.4 mm with suspected choledocholithiasis. -Leukocytosis:  Secondary to above.  PLAN: -Patient being admitted to the hospitalist service to receive IV fluids, bowel rest, pain control, antiemetics. She is being started on antibiotics. Would trend LFTs. She will undergo ERCP with stone removal on 5/25.  ZEHR, JESSICA D.  10/24/2015, 4:18 PM  Pager number  [phone]     ________________________________________________________________________  Velora Heckler GI MD note:  I personally examined the patient, reviewed the data and agree with the assessment and plan described above.  Pretty clear CBD stones, dilated duct on today's [place].  Planning for ERCP tomorrow. She will need surgical consult for eventual GB resection as well.   Owens Loffler, MD [facility] Pager [phone]

## 2015-10-24 NOTE — ED Notes (Signed)
Patient here via EMS with complaints of right upper quadrant abd pain after eating. 1 episode of vomiting. Pain 10/10.

## 2015-10-24 NOTE — ED Provider Notes (Signed)
CSN: [id number]     Arrival date & time 10/24/15  1339 History   First MD Initiated Contact with Patient 10/24/15 1358     Chief Complaint  Patient presents with  . Abdominal Pain     (Consider location/radiation/quality/duration/timing/severity/associated sxs/prior Treatment) HPI   Patient is a 70 year old female with COPD who presents to the ED with right upper quadrant abdominal pain since roughly 10 AM this morning. Patient said she woke this morning with mild RUQ pain that gradually got worse today. She ate a [employer] burger and the pain elevated to 10/10, constant, squeezing sensation, nonradiating with associated 1 episode of vomiting. Patient states she's had intermittent right upper quadrant pain for the past year. Pain occurs roughly once a week and vomiting makes it feel better. She states it is never gotten this bad. She denies history of gastric ulcers, NSAID use, or alcohol abuse. She denies fever, chills, back pain, chest pain, shortness of breath, hematuria, dysuria, changes in bowel habits.  History reviewed. No pertinent past medical history. History reviewed. No pertinent past surgical history. No family history on file. Social History  Substance Use Topics  . Smoking status: Former Smoker    Quit date: 06/02/2009  . Smokeless tobacco: None  . Alcohol Use: None   OB History    No data available     Review of Systems  Constitutional: Negative for fever and chills.  HENT: Negative for trouble swallowing.   Eyes: Negative for visual disturbance.  Respiratory: Negative for chest tightness and shortness of breath.   Gastrointestinal: Positive for vomiting and abdominal pain. Negative for diarrhea, constipation, blood in stool and abdominal distention.  Genitourinary: Negative for dysuria and hematuria.  Musculoskeletal: Negative for back pain and neck pain.  Skin: Negative for rash.  Allergic/Immunologic: Negative for immunocompromised state.  Neurological: Negative  for dizziness, syncope, weakness, light-headedness, numbness and headaches.      Allergies  Sulfa antibiotics  Home Medications   Prior to Admission medications   Medication Sig Start Date End Date Taking? Authorizing Provider  Biotin 5 MG TABS Take 5 mg by mouth daily.   Yes Historical Provider, MD   BP 142/72 mmHg  Pulse 68  Temp(Src) 97.7 F (36.5 C) (Oral)  Resp 18  SpO2 94% Physical Exam  Constitutional: She appears well-developed and well-nourished. No distress.  Patient is restless but in no acute distress.  HENT:  Head: Normocephalic and atraumatic.  Eyes: Conjunctivae are normal.  Cardiovascular: Normal rate and normal heart sounds.  An irregular rhythm present.  Pulses:      Dorsalis pedis pulses are 2+ on the right side, and 2+ on the left side.  Pulmonary/Chest: Effort normal and breath sounds normal. No accessory muscle usage. No respiratory distress. She has no wheezes. She has no rales.  Abdominal: Soft. Normal appearance. She exhibits no distension. Bowel sounds are decreased. There is tenderness in the right upper quadrant. There is guarding and positive Murphy's sign. There is no rigidity.  Musculoskeletal: Normal range of motion. She exhibits no edema.  Neurological: She is alert. Coordination normal.  Skin: Skin is warm and dry. She is not diaphoretic.  Psychiatric: She has a normal mood and affect. Her behavior is normal.    ED Course  Procedures (including critical care time) Labs Review Labs Reviewed  LIPASE, BLOOD - Abnormal; Notable for the following:    Lipase 747 (*)    All other components within normal limits  COMPREHENSIVE METABOLIC PANEL - Abnormal; Notable for  the following:    Glucose, Bld 151 (*)    Creatinine, Ser 1.07 (*)    Calcium 8.8 (*)    AST 78 (*)    Total Bilirubin 1.4 (*)    GFR calc non Af Amer 52 (*)    All other components within normal limits  CBC - Abnormal; Notable for the following:    WBC 14.3 (*)    All other  components within normal limits  URINALYSIS, ROUTINE W REFLEX MICROSCOPIC (NOT AT [facility])    Imaging Review US Abdomen Complete  10/24/2015  CLINICAL DATA:  Right upper quadrant pain for 1 day EXAM: ABDOMEN ULTRASOUND COMPLETE COMPARISON:  None. FINDINGS: Gallbladder: No cholelithiasis. Distended gallbladder. Trace amount of pericholecystic fluid. Normal gallbladder wall thickness measuring 1.5 mm. Common bile duct: Diameter: Dilated common bile duct measuring 16.4 mm with an 8 mm echogenic focus within the common bile duct concerning for choledocholithiasis. Liver: No focal lesion identified. Increased hepatic parenchymal echogenicity. IVC: No abnormality visualized. Pancreas: Visualized portion unremarkable. Spleen: Size and appearance within normal limits. Right Kidney: Length: 10.4 cm. Mild renal cortical thinning. Echogenicity within normal limits. No mass or hydronephrosis visualized. Left Kidney: Length: 10.9 cm. Echogenicity within normal limits. 1.9 x 1.1 x 2.4 cm and anechoic left renal mass most consistent with a cyst. No hydronephrosis visualized. Abdominal aorta: No aneurysm visualized. Other findings: None. IMPRESSION: 1. Dilated common bile duct measuring 16.4 mm with an 8 mm echogenic focus within the common bile duct concerning for choledocholithiasis. 2. Distended gallbladder with a trace amount of pericholecystic fluid. No cholelithiasis. Electronically Signed   By: Kathreen Devoid   On: 10/24/2015 15:53   I have personally reviewed and evaluated these images and lab results as part of my medical decision-making.   EKG Interpretation None      MDM   Final diagnoses:  None    Patient with right upper quadrant abdominal pain and vomiting concerning for cholecystitis. Ultrasound revealed dilated common bile duct concerning for choledocholithiasis without cholelithiasis. Labs reveal elevated lipase suggesting gallstone pancreatitis and mild leukocytosis and elevated total bilirubin.    Will consult GI and hospitalist team for admission.  Patient's oxygen saturations were in the upper 80s upon arrival to the ED. Placed patient on 2 L of O2 nasal cannula. Likely 2/2 patient's history of COPD. She states she does not take any medication for her COPD.  Dr. Alvino Chapel consulted the hospitalist team and GI who will admit the pt for further evaluation and treatment.    Kalman Drape, PA 10/24/15 [place], MD 10/24/15 [phone]

## 2015-10-24 NOTE — Progress Notes (Signed)
Kelly [name] is a 70 y.o. female patient admitted from ED awake, alert - oriented  X 4 - no acute distress noted.  VSS - Blood pressure 123/60, pulse 67, temperature 97.9 F (36.6 C), temperature source Oral, resp. rate 20, height 5\' 9"  (1.753 m), weight 77.111 kg (170 lb), SpO2 91 %.    IV in place, occlusive dsg intact without redness.  Orientation to room, and floor completed with information packet given to patient/family. Admission INP armband ID verified with patient/family, and in place.   SR up x 2, fall assessment complete, with patient able to verbalize understanding of risk associated with falls, and verbalized understanding to call nsg.  Call light within reach, patient able to voice, and demonstrate understanding.  Skin, clean-dry- intact without evidence of bruising, or skin tears.   No evidence of skin break down noted on exam.     Will cont to eval and treat per MD orders.  Marcy Salvo, RN 10/24/2015 6:44 PM

## 2015-10-24 NOTE — Anesthesia Preprocedure Evaluation (Addendum)
Anesthesia Evaluation  Patient identified by MRN, date of birth, ID band Patient awake    Reviewed: Allergy & Precautions, NPO status , Patient's Chart, lab work & pertinent test results  Airway Mallampati: II   Neck ROM: Full    Dental  (+) Teeth Intact   Pulmonary neg pulmonary ROS, COPD, former smoker (quit 2011),  COPD not treated   breath sounds clear to auscultation       Cardiovascular negative cardio ROS   Rhythm:Regular     Neuro/Psych negative neurological ROS  negative psych ROS   GI/Hepatic Neg liver ROS, Common duct stone, biliary hepatitis Nausea and vomiting     Endo/Other  negative endocrine ROS  Renal/GU negative Renal ROS  negative genitourinary   Musculoskeletal negative musculoskeletal ROS (+)   Abdominal (+) + obese,   Peds negative pediatric ROS (+)  Hematology negative hematology ROS (+) 13/38   Anesthesia Other Findings   Reproductive/Obstetrics negative OB ROS                           Anesthesia Physical Anesthesia Plan  ASA: III  Anesthesia Plan: General   Post-op Pain Management:    Induction: Intravenous  Airway Management Planned: Oral ETT  Additional Equipment:   Intra-op Plan:   Post-operative Plan: Extubation in OR  Informed Consent: I have reviewed the patients History and Physical, chart, labs and discussed the procedure including the risks, benefits and alternatives for the proposed anesthesia with the patient or authorized representative who has indicated his/her understanding and acceptance.     Plan Discussed with:   Anesthesia Plan Comments: (Significant COPD)        Anesthesia Quick Evaluation

## 2015-10-24 NOTE — H&P (Signed)
HISTORY AND PHYSICAL       PATIENT DETAILS Name: Kelly [name] Age: 70 y.o. Sex: female Date of Birth: 1945/08/11 Admit Date: 10/24/2015 [id number], KIM, MD Referring MD: Dr. Alvino Chapel  CHIEF COMPLAINT:  Right upper quadrant abdominal pain and vomiting since this morning  HPI: Kelly [name] is a 70 y.o. female with medical history significant of COPD, who presented to the ED today with complaints of persistent but worsening right upper quadrant abdominal pain since he woke up this morning. Pain is described as colicky, [id number] at its worst without any radiation. No particular aggravating or relieving factors. Pain is associated with 2- 3 episodes of nonbilious and nonbloody vomiting. Further workup in the ED revealed choledocholithiasis and gallstone pancreatitis. I was asked to admit this patient for further evaluation and treatment.  Interestingly, for the past 1 year or so, patient has had occasional right upper quadrant abdominal pain that lasts for 10-15 minutes and resolves on its own.  ED Course: Found to have lipase of 747, leukocytosis of 14,000, abdominal ultrasound showed a dilated CBD of 16.4 mm with evidence of choledocholithiasis.  Lives at: Home Mobility:  [place] Chronic Indwelling Foley:no   REVIEW OF SYSTEMS:  Constitutional:   No  weight loss, night sweats,  Fevers, chills, fatigue.  HEENT:    No headaches, Dysphagia,Tooth/dental problems,Sore throat,  No sneezing, itching, ear ache, nasal congestion, post nasal drip  Cardio-vascular: No chest pain,Orthopnea, PND,lower extremity edema, anasarca, palpitations  GI:  No heartburn, indigestion,  diarrhea, melena or hematochezia  Resp: No shortness of breath, cough, hemoptysis,plueritic chest pain.   Skin:  No rash or lesions.  GU:  No dysuria, change in color of urine, no urgency or frequency.  No flank pain.  Musculoskeletal: No joint pain or swelling.  No decreased range of  motion.  No back pain.  Endocrine: No heat intolerance, no cold intolerance, no polyuria, no polydipsia  Psych: No change in mood or affect. No depression or anxiety.  No memory loss.   ALLERGIES:   Allergies  Allergen Reactions  . Sulfa Antibiotics Rash    PAST MEDICAL HISTORY: History reviewed. No pertinent past medical history.  PAST SURGICAL HISTORY: Past Surgical History  Procedure Laterality Date  . Abdominal hysterectomy    . Tonsillectomy    . Bladder tack    . Dilation and curettage of uterus      MEDICATIONS AT HOME: Prior to Admission medications   Medication Sig Start Date End Date Taking? Authorizing Provider  Biotin 5 MG TABS Take 5 mg by mouth daily.   Yes Historical Provider, MD    FAMILY HISTORY: No family history of CAD  SOCIAL HISTORY:  reports that she quit smoking about 6 years ago. She has never used smokeless tobacco. Her alcohol and drug histories are not on file.  PHYSICAL EXAM: Blood pressure 118/74, pulse 62, temperature 97.7 F (36.5 C), temperature source Oral, resp. rate 18, SpO2 94 %.  General appearance :Awake, alert, not in any distress. Speech Clear. Not toxic Looking HEENT: Atraumatic and Normocephalic, pupils equally reactive to light and accomodation Neck: supple, no JVD. No cervical lymphadenopathy.  Chest:Good air entry bilaterally, no added sounds  CVS: S1 S2 regular, no murmurs.  Abdomen: Bowel sounds present, mildly tender right upper quadrant area without significant guarding or rigidity. No rebound. Rest of the abdomen is soft and nontender.  Extremities: B/L Lower Ext shows no edema, both legs are warm to touch  Neurology:  Non focal Psychiatric: Normal judgment and insight. Alert and oriented x 3. Normal mood. Skin:No Rash Wounds:N/A  LABS ON ADMISSION:  I have personally reviewed following labs and imaging studies  CBC:  Recent Labs Lab 10/24/15 1359  WBC 14.3*  HGB 12.9  HCT 38.7  MCV 87.8  PLT 274     Basic Metabolic Panel:  Recent Labs Lab 10/24/15 1359  NA 140  K 4.0  CL 108  CO2 26  GLUCOSE 151*  BUN 16  CREATININE 1.07*  CALCIUM 8.8*    GFR: CrCl cannot be calculated (Unknown ideal weight.).  Liver Function Tests:  Recent Labs Lab 10/24/15 1359  AST 78*  ALT 53  ALKPHOS 76  BILITOT 1.4*  PROT 6.7  ALBUMIN 3.9    Recent Labs Lab 10/24/15 1359  LIPASE 747*   No results for input(s): AMMONIA in the last 168 hours.  Coagulation Profile: No results for input(s): INR, PROTIME in the last 168 hours.  Cardiac Enzymes: No results for input(s): CKTOTAL, CKMB, CKMBINDEX, TROPONINI in the last 168 hours.  BNP (last 3 results) No results for input(s): PROBNP in the last 8760 hours.  HbA1C: No results for input(s): HGBA1C in the last 72 hours.  CBG: No results for input(s): GLUCAP in the last 168 hours.  Lipid Profile: No results for input(s): CHOL, HDL, LDLCALC, TRIG, CHOLHDL, LDLDIRECT in the last 72 hours.  Thyroid Function Tests: No results for input(s): TSH, T4TOTAL, FREET4, T3FREE, THYROIDAB in the last 72 hours.  Anemia Panel: No results for input(s): VITAMINB12, FOLATE, FERRITIN, TIBC, IRON, RETICCTPCT in the last 72 hours.  Urine analysis: No results found for: COLORURINE, APPEARANCEUR, LABSPEC, PHURINE, GLUCOSEU, HGBUR, BILIRUBINUR, KETONESUR, PROTEINUR, UROBILINOGEN, NITRITE, LEUKOCYTESUR  Sepsis Labs: Lactic Acid, Venous No results found for: [place]   Microbiology: No results found for this or any previous visit (from the past 240 hour(s)).    RADIOLOGIC STUDIES ON ADMISSION: US Abdomen Complete  10/24/2015  CLINICAL DATA:  Right upper quadrant pain for 1 day EXAM: ABDOMEN ULTRASOUND COMPLETE COMPARISON:  None. FINDINGS: Gallbladder: No cholelithiasis. Distended gallbladder. Trace amount of pericholecystic fluid. Normal gallbladder wall thickness measuring 1.5 mm. Common bile duct: Diameter: Dilated common bile duct  measuring 16.4 mm with an 8 mm echogenic focus within the common bile duct concerning for choledocholithiasis. Liver: No focal lesion identified. Increased hepatic parenchymal echogenicity. IVC: No abnormality visualized. Pancreas: Visualized portion unremarkable. Spleen: Size and appearance within normal limits. Right Kidney: Length: 10.4 cm. Mild renal cortical thinning. Echogenicity within normal limits. No mass or hydronephrosis visualized. Left Kidney: Length: 10.9 cm. Echogenicity within normal limits. 1.9 x 1.1 x 2.4 cm and anechoic left renal mass most consistent with a cyst. No hydronephrosis visualized. Abdominal aorta: No aneurysm visualized. Other findings: None. IMPRESSION: 1. Dilated common bile duct measuring 16.4 mm with an 8 mm echogenic focus within the common bile duct concerning for choledocholithiasis. 2. Distended gallbladder with a trace amount of pericholecystic fluid. No cholelithiasis. Electronically Signed   By: Kathreen Devoid   On: 10/24/2015 15:53    EKG:  Not done  ASSESSMENT AND PLAN: Present on Admission:  .Acute gallstone pancreatitis with Choledocholithiasis: Admit to medical surgical unit, keep nothing by mouth, IV fluids and other supportive measures. Start intravenous Zosyn. GI consulted, plans are for ERCP tomorrow. Once pancreatitis is better and ERCP is completed, will need general surgical consultation for laparoscopic cholecystectomy prior to discharge.   Marland Kitchen COPD (chronic obstructive pulmonary disease): Lungs clear-appears stable,  as needed bronchodilators.  Further plan will depend as patient's clinical course evolves and further radiologic and laboratory data become available. Patient will be monitored closely.  Above noted plan was discussed with patient/family face to face at bedside, they were in agreement.   CONSULTS: GI  DVT Prophylaxis: SCD's  Code Status: Full Code  Disposition Plan:  Discharge back home in 2-3 days  Admission status:  Inpatient going to medical floor  Total time spent  45 minutes.Greater than 50% of this time was spent in counseling, explanation of diagnosis, planning of further management, and coordination of care.  [facility] Pager [phone]  If 7PM-7AM, please contact night-coverage [email or web address] Password [email or web address] 10/24/2015, 4:39 PM

## 2015-10-24 NOTE — ED Notes (Signed)
Bed: [id number] Expected date:  Expected time:  Means of arrival:  Comments: EMS 69y F abdom. Pain 1 episode of vomiting.

## 2015-10-24 NOTE — Progress Notes (Signed)
Pharmacy Antibiotic Follow-up Note  Kelly [name] is a 70 y.o. year-old female admitted on 10/24/2015.  The patient is currently on day 1 of Zosyn for intra-abd infection.  Assessment/Plan: Zosyn 3.375g IV q8h (4 hour infusion).  Temp (24hrs), Avg:97.7 F (36.5 C), Min:97.7 F (36.5 C), Max:97.7 F (36.5 C)   Recent Labs Lab 10/24/15 1359  WBC 14.3*    Recent Labs Lab 10/24/15 1359  CREATININE 1.07*   CrCl cannot be calculated (Unknown ideal weight.).    Allergies  Allergen Reactions  . Sulfa Antibiotics Rash   Antimicrobials this admission: 5/24 Zosyn >>   Levels/dose changes this admission:  Microbiology results: None ordered at this time  Thank you for allowing pharmacy to be a part of this patient's care.  Minda Ditto PharmD 10/24/2015 4:48 PM

## 2015-10-24 NOTE — ED Notes (Signed)
Aware we need urine, unable at this time.

## 2015-10-25 ENCOUNTER — Inpatient Hospital Stay (HOSPITAL_COMMUNITY): Payer: Medicare Other | Admitting: Anesthesiology

## 2015-10-25 ENCOUNTER — Encounter (HOSPITAL_COMMUNITY): Payer: Self-pay

## 2015-10-25 ENCOUNTER — Inpatient Hospital Stay (HOSPITAL_COMMUNITY): Payer: Medicare Other

## 2015-10-25 ENCOUNTER — Encounter (HOSPITAL_COMMUNITY)
Admission: EM | Disposition: A | Discharge status: Home / Self Care | Payer: Self-pay | Source: Home / Self Care | Attending: Internal Medicine

## 2015-10-25 HISTORY — PX: ERCP: SHX5425

## 2015-10-25 LAB — COMPREHENSIVE METABOLIC PANEL
ALT: 206 U/L — ABNORMAL HIGH (ref 14–54)
AST: 199 U/L — ABNORMAL HIGH (ref 15–41)
Albumin: 3.1 g/dL — ABNORMAL LOW (ref 3.5–5.0)
Alkaline Phosphatase: 85 U/L (ref 38–126)
Anion gap: 5 (ref 5–15)
BUN: 17 mg/dL (ref 6–20)
CO2: 25 mmol/L (ref 22–32)
Calcium: 7.9 mg/dL — ABNORMAL LOW (ref 8.9–10.3)
Chloride: 111 mmol/L (ref 101–111)
Creatinine, Ser: 1.01 mg/dL — ABNORMAL HIGH (ref 0.44–1.00)
GFR calc Af Amer: >60 mL/min (ref >60)
GFR calc non Af Amer: 56 mL/min — ABNORMAL LOW (ref >60)
Glucose, Bld: 129 mg/dL — ABNORMAL HIGH (ref 65–99)
Potassium: 4.1 mmol/L (ref 3.5–5.1)
Sodium: 141 mmol/L (ref 135–145)
Total Bilirubin: 2 mg/dL — ABNORMAL HIGH (ref 0.3–1.2)
Total Protein: 5.6 g/dL — ABNORMAL LOW (ref 6.5–8.1)

## 2015-10-25 LAB — CBC
HCT: 35.9 % — ABNORMAL LOW (ref 36.0–46.0)
Hemoglobin: 11.8 g/dL — ABNORMAL LOW (ref 12.0–15.0)
MCH: 29.9 pg (ref 26.0–34.0)
MCHC: 32.9 g/dL (ref 30.0–36.0)
MCV: 91.1 fL (ref 78.0–100.0)
Platelets: 246 10*3/uL (ref 150–400)
RBC: 3.94 MIL/uL (ref 3.87–5.11)
RDW: 12.9 % (ref 11.5–15.5)
WBC: 11.1 10*3/uL — ABNORMAL HIGH (ref 4.0–10.5)

## 2015-10-25 LAB — PROTIME-INR
INR: 1.14 (ref 0.00–1.49)
Prothrombin Time: 14.8 seconds (ref 11.6–15.2)

## 2015-10-25 IMAGING — RF DG ERCP WO/W SPHINCTEROTOMY
1 series · 15 of 17 positions shown · non-contrast
Comparison: Ultrasound [DATE]

CLINICAL DATA: 69-year-old female with a history of known common
biliary duct stones.

EXAM:
ERCP
TECHNIQUE: Multiple spot images obtained with the fluoroscopic device and
submitted for interpretation post-procedure.
FLUOROSCOPY TIME:  Fluoroscopy Time:  2 minutes 23 seconds
Number of Acquired Images:

[Series 1: run · 5 acquisitions, 15 frames shown]
[im 1/5]
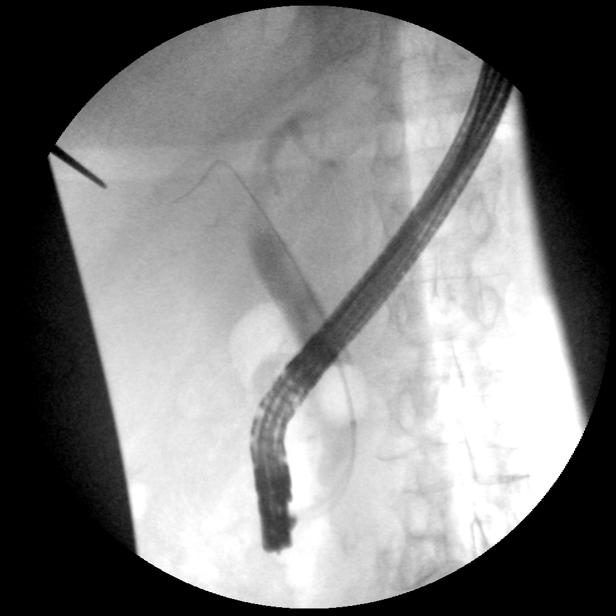
[im 1/5]
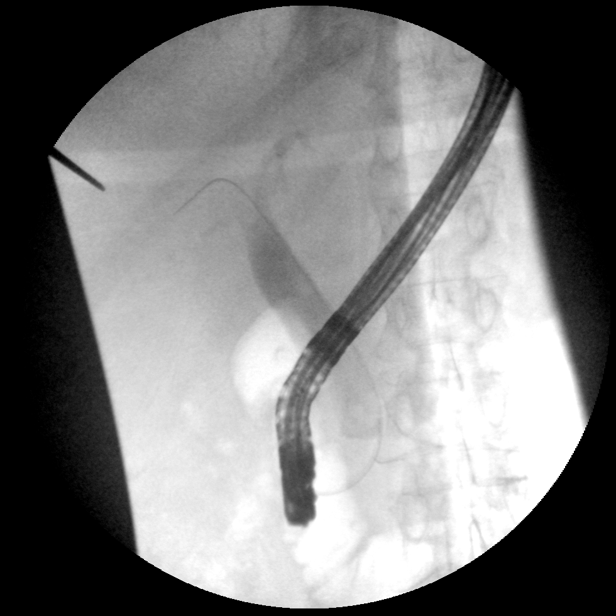
[im 1/5]
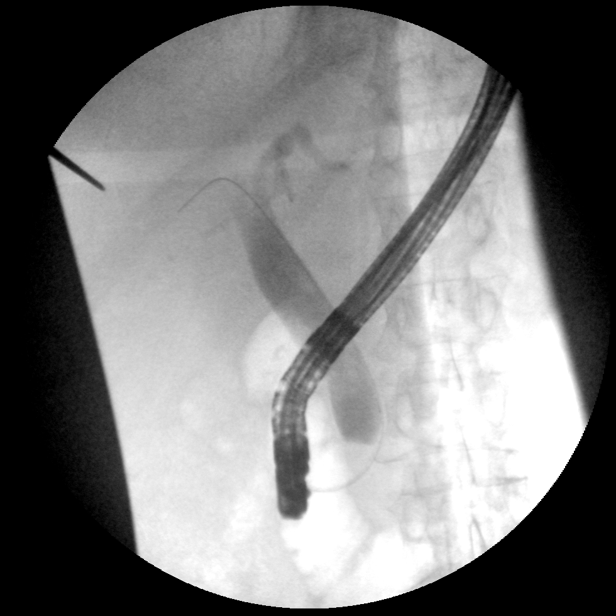
[im 1/5]
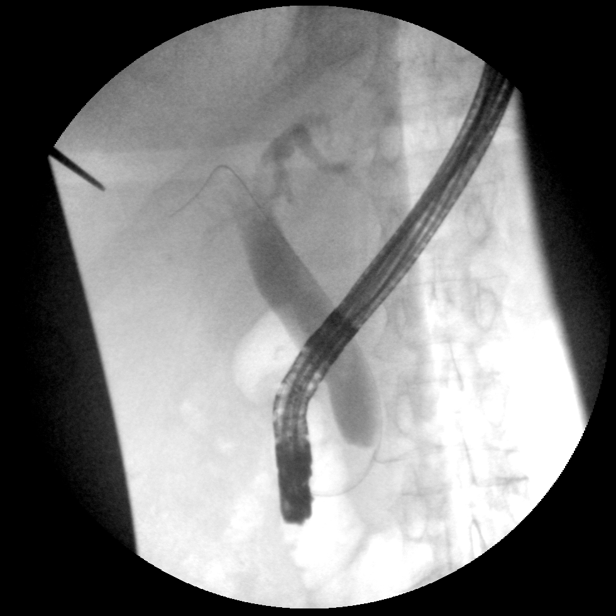
[im 3/5]
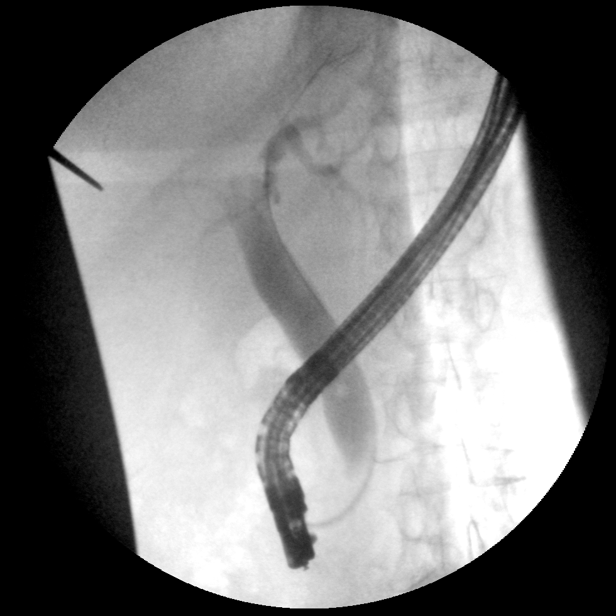
[im 3/5]
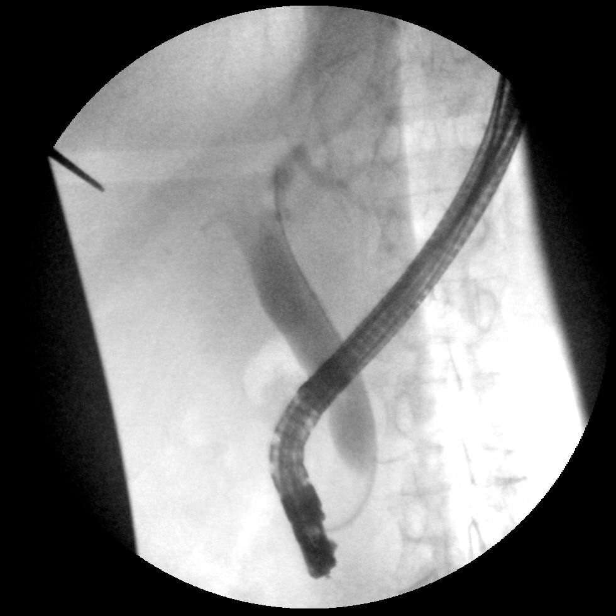
[im 3/5]
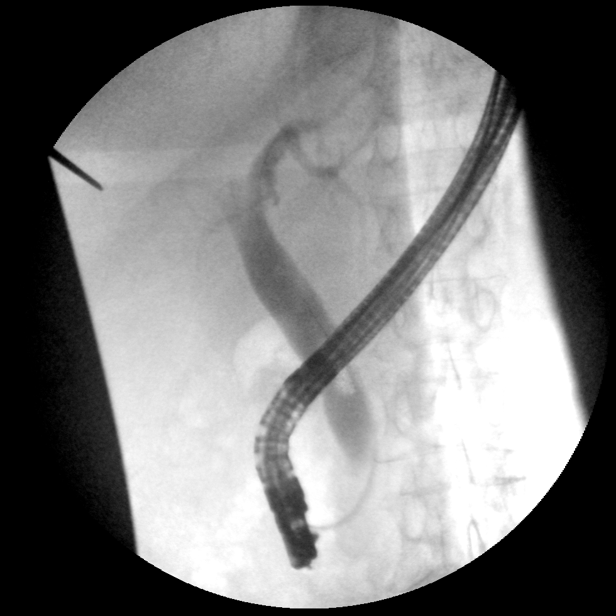
[im 3/5]
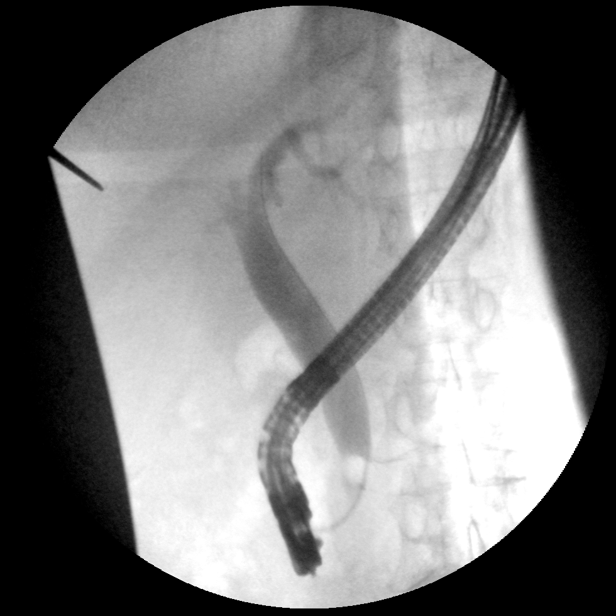
[im 4/5]
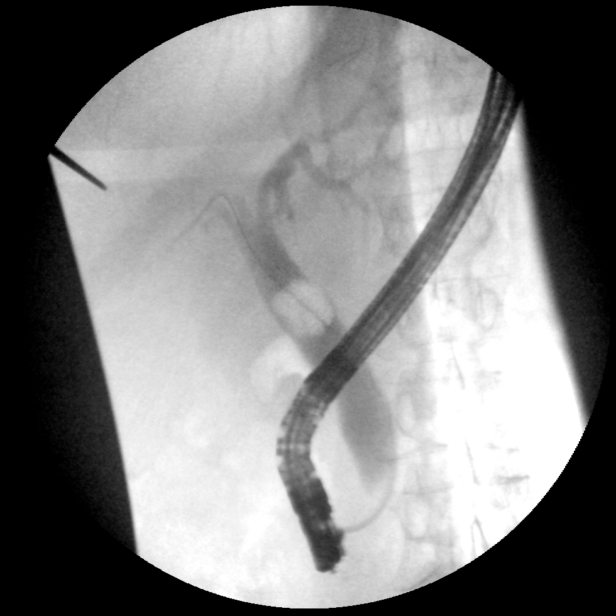
[im 4/5]
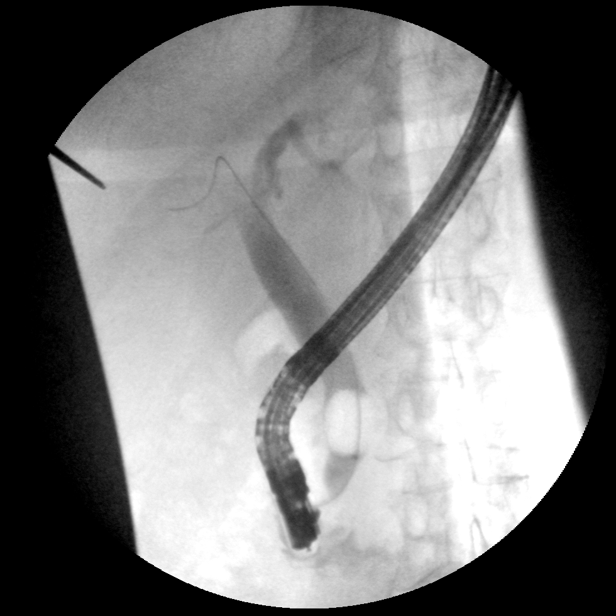
[im 4/5]
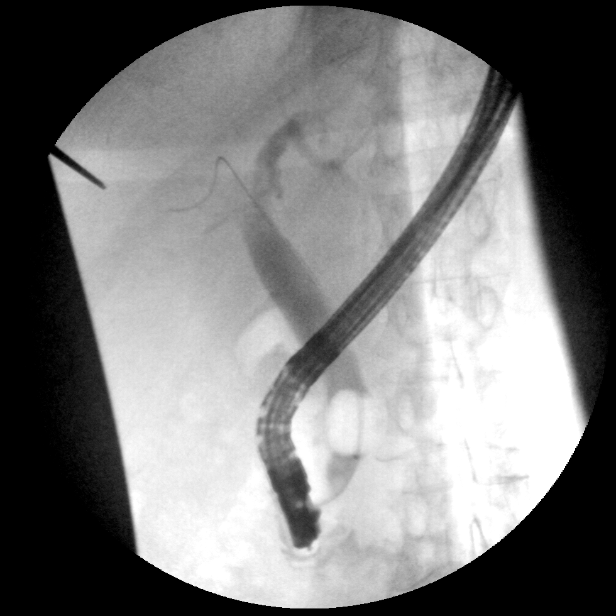
[im 5/5]
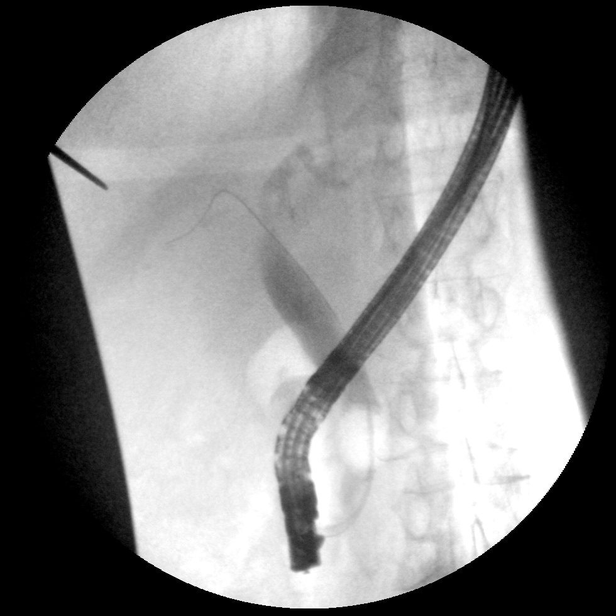
[im 5/5]
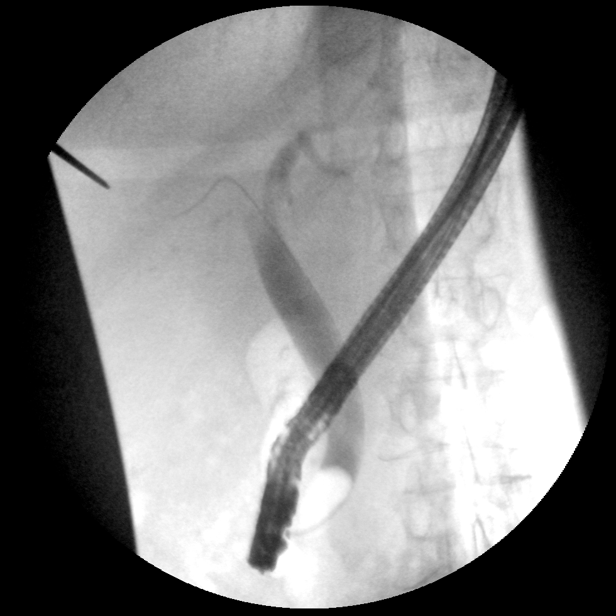
[im 5/5]
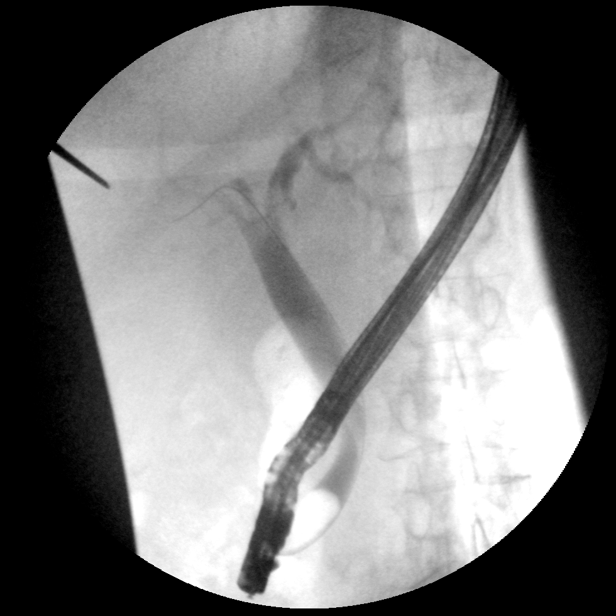
[im 5/5]
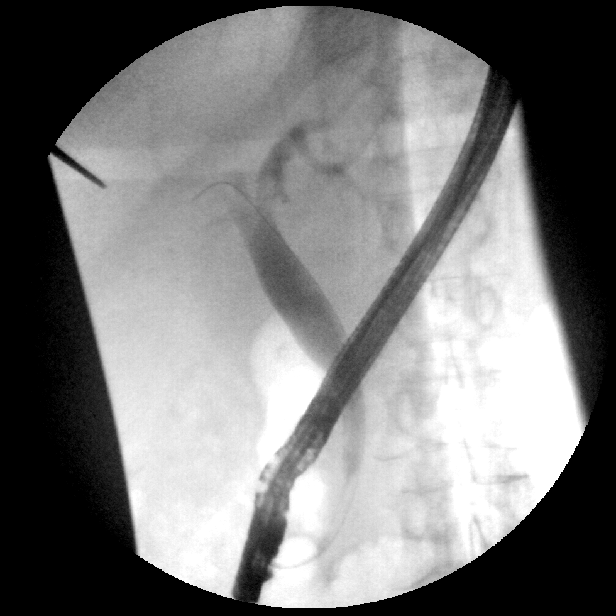

[15 of 17 positions shown; findings below may reference images not displayed]

FINDINGS: Limited intraoperative fluoroscopic spot images during ERCP.

Initial images demonstrate endoscope projecting over the upper
abdomen with cannulation of the ampulla.

Incomplete opacification of the extrahepatic biliary system. Cine
clip demonstrates balloon basket within the distal common bile duct.

Final image demonstrates no large filling defect of the common bile
duct.
IMPRESSION: Limited intraoperative images during ERCP with balloon basket
present within the common bile duct.

Please refer to the dictated operative report for full details of
intraoperative findings and procedure.

## 2015-10-25 SURGERY — ERCP, WITH INTERVENTION IF INDICATED
Anesthesia: General

## 2015-10-25 MED ORDER — LIDOCAINE HCL (CARDIAC) 20 MG/ML IV SOLN
INTRAVENOUS | Status: AC
  Filled 2015-10-25: qty 5

## 2015-10-25 MED ORDER — MEPERIDINE HCL 100 MG/ML IJ SOLN: 6.2500 mg | INTRAMUSCULAR | Status: DC | PRN

## 2015-10-25 MED ORDER — FENTANYL CITRATE (PF) 100 MCG/2ML IJ SOLN
INTRAMUSCULAR | Status: DC | PRN
  Administered 2015-10-25: 25 ug via INTRAVENOUS
  Administered 2015-10-25: 50 ug via INTRAVENOUS

## 2015-10-25 MED ORDER — ONDANSETRON HCL 4 MG/2ML IJ SOLN
INTRAMUSCULAR | Status: DC | PRN
  Administered 2015-10-25: 4 mg via INTRAVENOUS

## 2015-10-25 MED ORDER — GLUCAGON HCL RDNA (DIAGNOSTIC) 1 MG IJ SOLR
INTRAMUSCULAR | Status: DC | PRN
  Administered 2015-10-25: .5 mg via INTRAVENOUS

## 2015-10-25 MED ORDER — SODIUM CHLORIDE 0.9 % IV SOLN
INTRAVENOUS | Status: DC | PRN
  Administered 2015-10-25: 40 mL

## 2015-10-25 MED ORDER — INDOMETHACIN 50 MG RE SUPP
100.0000 mg | Freq: Once | RECTAL | Status: AC
  Administered 2015-10-25: 100 mg via RECTAL

## 2015-10-25 MED ORDER — SUCCINYLCHOLINE CHLORIDE 20 MG/ML IJ SOLN
INTRAMUSCULAR | Status: DC | PRN
  Administered 2015-10-25: 100 mg via INTRAVENOUS

## 2015-10-25 MED ORDER — INDOMETHACIN 50 MG RE SUPP
RECTAL | Status: AC
  Filled 2015-10-25: qty 2

## 2015-10-25 MED ORDER — GLUCAGON HCL RDNA (DIAGNOSTIC) 1 MG IJ SOLR
INTRAMUSCULAR | Status: AC
  Filled 2015-10-25: qty 1

## 2015-10-25 MED ORDER — ONDANSETRON HCL 4 MG/2ML IJ SOLN: 4.0000 mg | Freq: Once | INTRAMUSCULAR | Status: DC

## 2015-10-25 MED ORDER — FENTANYL CITRATE (PF) 100 MCG/2ML IJ SOLN
INTRAMUSCULAR | Status: AC
  Filled 2015-10-25: qty 2

## 2015-10-25 MED ORDER — EPHEDRINE SULFATE 50 MG/ML IJ SOLN
INTRAMUSCULAR | Status: DC | PRN
  Administered 2015-10-25: 5 mg via INTRAVENOUS

## 2015-10-25 MED ORDER — FENTANYL CITRATE (PF) 100 MCG/2ML IJ SOLN: 25.0000 ug | INTRAMUSCULAR | Status: DC | PRN

## 2015-10-25 MED ORDER — LIDOCAINE HCL (CARDIAC) 20 MG/ML IV SOLN
INTRAVENOUS | Status: DC | PRN
  Administered 2015-10-25: 50 mg via INTRAVENOUS

## 2015-10-25 MED ORDER — PROPOFOL 10 MG/ML IV BOLUS
INTRAVENOUS | Status: AC
  Filled 2015-10-25: qty 20

## 2015-10-25 MED ORDER — PROPOFOL 10 MG/ML IV BOLUS
INTRAVENOUS | Status: DC | PRN
  Administered 2015-10-25: 120 mg via INTRAVENOUS

## 2015-10-25 MED ORDER — SODIUM CHLORIDE 0.9 % IV SOLN
INTRAVENOUS | Status: DC
  Administered 2015-10-25: 11:00:00 via INTRAVENOUS

## 2015-10-25 NOTE — Anesthesia Procedure Notes (Signed)
Procedure Name: Intubation Date/Time: 10/25/2015 11:01 AM Performed by: Glory Buff Pre-anesthesia Checklist: Patient identified, Emergency Drugs available, Suction available and Patient being monitored Patient Re-evaluated:Patient Re-evaluated prior to inductionOxygen Delivery Method: Circle System Utilized Preoxygenation: Pre-oxygenation with 100% oxygen Intubation Type: IV induction Ventilation: Mask ventilation without difficulty Laryngoscope Size: Miller and 3 Grade View: Grade II Tube type: Oral Tube size: 7.0 mm Number of attempts: 1 Airway Equipment and Method: Stylet and Oral airway Placement Confirmation: ETT inserted through vocal cords under direct vision,  positive ETCO2 and breath sounds checked- equal and bilateral Secured at: 21 cm Tube secured with: Tape Dental Injury: Teeth and Oropharynx as per pre-operative assessment

## 2015-10-25 NOTE — Progress Notes (Addendum)
PROGRESS NOTE        PATIENT DETAILS Name: Kelly [name] Age: 70 y.o. Sex: female Date of Birth: [birth year] Admit Date: 10/24/2015 Admitting Physician Evalee Mutton Kristeen Mans, MD [id number], Maudie Mercury, MD  Brief Narrative: Patient is a 70 y.o. female presented with right upper quadrant abdominal pain, found to have gallstone pancreatitis with choledocholithiasis. Underwent ERCP on 5/25  Subjective: Minimal right upper quadrant pain. Normal vomiting.  Assessment/Plan: Acute gallstone pancreatitis with Choledocholithiasis: Improved with significantly less abdominal pain today, underwent ERCP with sphincterotomy and stone extraction. Gen. surgery consulted for potential cholecystectomy prior to discharge. Continue supportive care.Given any significant cardiac comorbidities and good functional METs, should be ok to proceed with lap cholecystectomy.  COPD: Stable-lungs clear with no rhonchi.  DVT Prophylaxis: SCD's  Code Status: Full code   Family Communication: None at bedside  Disposition Plan: Remain inpatient-home in 2 days  Antimicrobial agents: IV Zosyn 5/24>>  Procedures: ERCP 5/25  CONSULTS:  GI and general surgery  Time spent: 25 minutes-Greater than 50% of this time was spent in counseling, explanation of diagnosis, planning of further management, and coordination of care.  MEDICATIONS: Anti-infectives    Start     Dose/Rate Route Frequency Ordered Stop   10/25/15 0100  piperacillin-tazobactam (ZOSYN) IVPB 3.375 g     3.375 g 12.5 mL/hr over 240 Minutes Intravenous Every 8 hours 10/24/15 1659     10/24/15 1645  piperacillin-tazobactam (ZOSYN) IVPB 3.375 g     3.375 g 100 mL/hr over 30 Minutes Intravenous  Once 10/24/15 1640 10/24/15 1725      Scheduled Meds: . piperacillin-tazobactam (ZOSYN)  IV  3.375 g Intravenous Q8H   Continuous Infusions: . sodium chloride 150 mL/hr at 10/25/15 0855   PRN Meds:.albuterol, morphine injection,  ondansetron **OR** ondansetron (ZOFRAN) IV, oxyCODONE   PHYSICAL EXAM: Vital signs: Filed Vitals:   10/25/15 1200 10/25/15 1205 10/25/15 1210 10/25/15 1220  BP: 113/58  119/71 125/59  Pulse: 61 58 55 55  Temp:      TempSrc:      Resp: 10 10 11 15   Height:      Weight:      SpO2: 88% 90% 94% 95%   Filed Weights   10/24/15 1802 10/25/15 1017  Weight: 77.111 kg (170 lb) 77.111 kg (170 lb)   Body mass index is 25.09 kg/(m^2).   Gen Exam: Awake and alert with clear speech. Not in any distress Neck: Supple, No JVD.   Chest: B/L Clear.   CVS: S1 S2 Regular, no murmurs.  Abdomen: soft, BS +,minimally tender RUQ, non distended.  Extremities: no edema, lower extremities warm to touch. Neurologic: Non Focal.   Skin: No Rash or lesions   Wounds: N/A.   LABORATORY DATA: CBC:  Recent Labs Lab 10/24/15 1359 10/25/15 0417  WBC 14.3* 11.1*  HGB 12.9 11.8*  HCT 38.7 35.9*  MCV 87.8 91.1  PLT 274 0000000    Basic Metabolic Panel:  Recent Labs Lab 10/24/15 1359 10/25/15 0417  NA 140 141  K 4.0 4.1  CL 108 111  CO2 26 25  GLUCOSE 151* 129*  BUN 16 17  CREATININE 1.07* 1.01*  CALCIUM 8.8* 7.9*    GFR: Estimated Creatinine Clearance: 56.5 mL/min (by C-G formula based on Cr of 1.01).  Liver Function Tests:  Recent Labs Lab 10/24/15 1359 10/25/15 0417  AST 78*  199*  ALT 53 206*  ALKPHOS 76 85  BILITOT 1.4* 2.0*  PROT 6.7 5.6*  ALBUMIN 3.9 3.1*    Recent Labs Lab 10/24/15 1359  LIPASE 747*   No results for input(s): AMMONIA in the last 168 hours.  Coagulation Profile:  Recent Labs Lab 10/25/15 0417  INR 1.14    Cardiac Enzymes: No results for input(s): CKTOTAL, CKMB, CKMBINDEX, TROPONINI in the last 168 hours.  BNP (last 3 results) No results for input(s): PROBNP in the last 8760 hours.  HbA1C: No results for input(s): HGBA1C in the last 72 hours.  CBG: No results for input(s): GLUCAP in the last 168 hours.  Lipid Profile: No results for  input(s): CHOL, HDL, LDLCALC, TRIG, CHOLHDL, LDLDIRECT in the last 72 hours.  Thyroid Function Tests: No results for input(s): TSH, T4TOTAL, FREET4, T3FREE, THYROIDAB in the last 72 hours.  Anemia Panel: No results for input(s): VITAMINB12, FOLATE, FERRITIN, TIBC, IRON, RETICCTPCT in the last 72 hours.  Urine analysis:    Component Value Date/Time   COLORURINE AMBER* 10/24/2015 1615   APPEARANCEUR TURBID* 10/24/2015 1615   LABSPEC 1.018 10/24/2015 1615   PHURINE 6.5 10/24/2015 1615   GLUCOSEU NEGATIVE 10/24/2015 1615   HGBUR NEGATIVE 10/24/2015 1615   [place] 10/24/2015 1615   KETONESUR NEGATIVE 10/24/2015 1615   PROTEINUR NEGATIVE 10/24/2015 1615   NITRITE POSITIVE* 10/24/2015 1615   LEUKOCYTESUR MODERATE* 10/24/2015 1615    Sepsis Labs: Lactic Acid, Venous No results found for: LATICACIDVEN  MICROBIOLOGY: No results found for this or any previous visit (from the past 240 hour(s)).  RADIOLOGY STUDIES/RESULTS: US Abdomen Complete  10/24/2015  CLINICAL DATA:  Right upper quadrant pain for 1 day EXAM: ABDOMEN ULTRASOUND COMPLETE COMPARISON:  None. FINDINGS: Gallbladder: No cholelithiasis. Distended gallbladder. Trace amount of pericholecystic fluid. Normal gallbladder wall thickness measuring 1.5 mm. Common bile duct: Diameter: Dilated common bile duct measuring 16.4 mm with an 8 mm echogenic focus within the common bile duct concerning for choledocholithiasis. Liver: No focal lesion identified. Increased hepatic parenchymal echogenicity. IVC: No abnormality visualized. Pancreas: Visualized portion unremarkable. Spleen: Size and appearance within normal limits. Right Kidney: Length: 10.4 cm. Mild renal cortical thinning. Echogenicity within normal limits. No mass or hydronephrosis visualized. Left Kidney: Length: 10.9 cm. Echogenicity within normal limits. 1.9 x 1.1 x 2.4 cm and anechoic left renal mass most consistent with a cyst. No hydronephrosis visualized. Abdominal  aorta: No aneurysm visualized. Other findings: None. IMPRESSION: 1. Dilated common bile duct measuring 16.4 mm with an 8 mm echogenic focus within the common bile duct concerning for choledocholithiasis. 2. Distended gallbladder with a trace amount of pericholecystic fluid. No cholelithiasis. Electronically Signed   By: Kathreen Devoid   On: 10/24/2015 15:53     LOS: 1 day   Oren Binet, MD  [facility] Pager:[phone]  If 7PM-7AM, please contact night-coverage [email or web address] Password [email or web address] 10/25/2015, 1:26 PM

## 2015-10-25 NOTE — Interval H&P Note (Signed)
History and Physical Interval Note:  10/25/2015 10:45 AM  Kelly [name]  has presented today for surgery, with the diagnosis of CBD stone  The various methods of treatment have been discussed with the patient and family. After consideration of risks, benefits and other options for treatment, the patient has consented to  Procedure(s): ENDOSCOPIC RETROGRADE CHOLANGIOPANCREATOGRAPHY (ERCP) (N/A) as a surgical intervention .  The patient's history has been reviewed, patient examined, no change in status, stable for surgery.  I have reviewed the patient's chart and labs.  Questions were answered to the patient's satisfaction.     Milus Banister

## 2015-10-25 NOTE — Anesthesia Postprocedure Evaluation (Signed)
Anesthesia Post Note  Patient: Kelly [name]  Procedure(s) Performed: Procedure(s) (LRB): ENDOSCOPIC RETROGRADE CHOLANGIOPANCREATOGRAPHY (ERCP) (N/A)  Patient location during evaluation: Endoscopy Anesthesia Type: General Level of consciousness: awake and alert Pain management: pain level controlled Vital Signs Assessment: post-procedure vital signs reviewed and stable Respiratory status: spontaneous breathing, nonlabored ventilation, respiratory function stable and patient connected to nasal cannula oxygen Cardiovascular status: blood pressure returned to baseline and stable Postop Assessment: no signs of nausea or vomiting Anesthetic complications: no    Last Vitals:  Filed Vitals:   10/25/15 1150 10/25/15 1155  BP: 116/54   Pulse: 67 64  Temp:    Resp: 15 21    Last Pain:  Filed Vitals:   10/25/15 1155  PainSc: 0-No pain                 Alexis Frock

## 2015-10-25 NOTE — Consult Note (Signed)
[facility] Surgery Consult Note  Kelly [name] 1945/07/21  [id number].    Requesting MD: Dr. Sloan Leiter Chief Complaint/Reason for Consult: Gallstone pancreatitis and choledocholithiasis  HPI:  70 y/o female former smoker with PMH significant of COPD, who presented to [facility] today with complaints of persistent but worsening RUQ abdominal pain since the morning of 10/24/15.  Pain is described as colicky, 53/29 at its worst without any radiation.  No particular aggravating or relieving factors.  Pain is associated with 2-3 episodes of NB/NB vomiting. Workup in the ED revealed choledocholithiasis and gallstone pancreatitis.  The patient notes the pain has been going on for 1 year or so, patient has had occasional RUQ abdomianl pain that lasts for 10-15 minutes and resolves on its own. She says that she actually has episodes of this almost weekly after eating high fiber, or salads.  It may have been caused by coffee creamer, salad dressing etc.  H/o abdominal hysterectomy, bladder tack in January 2012, and inguinal hernia repair at 70 y/o.  During her hysterectomy/bladder tack surgery she experienced post-operative hypoxemia and Dr. Alva Garnet from [facility] was consulted for post-op respiratory management.  She was found to have choledocholithiasis and gallstone pancreatitis.  She had a successful ERCP with stone extraction today by Dr. Ardis Hughs.  We were consulted regarding removal of her gallbladder.  The pain is now nearly resolved.  No N/V, no CP/SOB, fever/chills, changes in bowel/bladder habits, acholic stools, dark urine, or jaundice.    Of note the patient is trying to get to her mother's 90th birthday party which is at 2pm on Sunday, 1 hr outside of [place].  ROS: All systems reviewed and otherwise negative except for as above  History reviewed. No pertinent family history.  Past Medical History  Diagnosis Date  . COPD (chronic obstructive pulmonary disease) [facility])     Past Surgical History   Procedure Laterality Date  . Abdominal hysterectomy    . Tonsillectomy    . Bladder tack    . Dilation and curettage of uterus    . Hernia repair      inguinal hernia repair    Social History:  reports that she quit smoking about 6 years ago. She has never used smokeless tobacco. She reports that she does not drink alcohol or use illicit drugs.  Allergies:  Allergies  Allergen Reactions  . Sulfa Antibiotics Rash    Medications Prior to Admission  Medication Sig Dispense Refill  . Biotin 5 MG TABS Take 5 mg by mouth daily.      Blood pressure 125/59, pulse 55, temperature 97.5 F (36.4 C), temperature source Oral, resp. rate 15, height _0  (1.753 m), weight 170 lb (77.111 kg), SpO2 95 %. Physical Exam: General: pleasant, WD/WN white female who is laying in bed in NAD HEENT: head is normocephalic, atraumatic.  Sclera are noninjected.  PERRL.  Ears and nose without any masses or lesions.  Mouth is pink and moist Heart: regular, rate, and rhythm.  No obvious murmurs, gallops, or rubs noted.  Palpable pedal pulses bilaterally Lungs: CTAB, no wheezes, rhonchi, or rales noted.  Respiratory effort nonlabored, great effort. Abd: soft, NT/ND, +BS, no masses, hernias, or organomegaly, no scars really visualized  MS: all 4 extremities are symmetrical with no cyanosis, clubbing, or edema. Skin: warm and dry with no masses, lesions, or rashes Psych: A&Ox3 with an appropriate affect.   Results for orders placed or performed during the hospital encounter of 10/24/15 (from the past 48 hour(s))  Lipase, blood     Status: Abnormal   Collection Time: 10/24/15  1:59 PM  Result Value Ref Range   Lipase 747 (H) 11 - 51 U/L    Comment: RESULTS CONFIRMED BY MANUAL DILUTION  Comprehensive metabolic panel     Status: Abnormal   Collection Time: 10/24/15  1:59 PM  Result Value Ref Range   Sodium 140 135 - 145 mmol/L   Potassium 4.0 3.5 - 5.1 mmol/L   Chloride 108 101 - 111 mmol/L   CO2 26 22 -  32 mmol/L   Glucose, Bld 151 (H) 65 - 99 mg/dL   BUN 16 6 - 20 mg/dL   Creatinine, Ser 1.07 (H) 0.44 - 1.00 mg/dL   Calcium 8.8 (L) 8.9 - 10.3 mg/dL   Total Protein 6.7 6.5 - 8.1 g/dL   Albumin 3.9 3.5 - 5.0 g/dL   AST 78 (H) 15 - 41 U/L   ALT 53 14 - 54 U/L   Alkaline Phosphatase 76 38 - 126 U/L   Total Bilirubin 1.4 (H) 0.3 - 1.2 mg/dL   GFR calc non Af Amer 52 (L) >60 mL/min   GFR calc Af Amer >60 >60 mL/min    Comment: (NOTE) The eGFR has been calculated using the CKD EPI equation. This calculation has not been validated in all clinical situations. eGFR's persistently <60 mL/min signify possible Chronic Kidney Disease.    Anion gap 6 5 - 15  CBC     Status: Abnormal   Collection Time: 10/24/15  1:59 PM  Result Value Ref Range   WBC 14.3 (H) 4.0 - 10.5 K/uL   RBC 4.41 3.87 - 5.11 MIL/uL   Hemoglobin 12.9 12.0 - 15.0 g/dL   HCT 38.7 36.0 - 46.0 %   MCV 87.8 78.0 - 100.0 fL   MCH 29.3 26.0 - 34.0 pg   MCHC 33.3 30.0 - 36.0 g/dL   RDW 12.5 11.5 - 15.5 %   Platelets 274 150 - 400 K/uL  Urinalysis, Routine w reflex microscopic     Status: Abnormal   Collection Time: 10/24/15  4:15 PM  Result Value Ref Range   Color, Urine AMBER (A) YELLOW    Comment: BIOCHEMICALS MAY BE AFFECTED BY COLOR   APPearance TURBID (A) CLEAR   Specific Gravity, Urine 1.018 1.005 - 1.030   pH 6.5 5.0 - 8.0   Glucose, UA NEGATIVE NEGATIVE mg/dL   Hgb urine dipstick NEGATIVE NEGATIVE   Bilirubin Urine NEGATIVE NEGATIVE   Ketones, ur NEGATIVE NEGATIVE mg/dL   Protein, ur NEGATIVE NEGATIVE mg/dL   Nitrite POSITIVE (A) NEGATIVE   Leukocytes, UA MODERATE (A) NEGATIVE  Urine microscopic-add on     Status: Abnormal   Collection Time: 10/24/15  4:15 PM  Result Value Ref Range   Squamous Epithelial / LPF 6-30 (A) NONE SEEN   WBC, UA TOO NUMEROUS TO COUNT 0 - 5 WBC/hpf   RBC / HPF NONE SEEN 0 - 5 RBC/hpf   Bacteria, UA MANY (A) NONE SEEN  CBC     Status: Abnormal   Collection Time: 10/25/15  4:17 AM   Result Value Ref Range   WBC 11.1 (H) 4.0 - 10.5 K/uL   RBC 3.94 3.87 - 5.11 MIL/uL   Hemoglobin 11.8 (L) 12.0 - 15.0 g/dL   HCT 35.9 (L) 36.0 - 46.0 %   MCV 91.1 78.0 - 100.0 fL   MCH 29.9 26.0 - 34.0 pg   MCHC 32.9 30.0 - 36.0 g/dL  RDW 12.9 11.5 - 15.5 %   Platelets 246 150 - 400 K/uL  Comprehensive metabolic panel     Status: Abnormal   Collection Time: 10/25/15  4:17 AM  Result Value Ref Range   Sodium 141 135 - 145 mmol/L   Potassium 4.1 3.5 - 5.1 mmol/L   Chloride 111 101 - 111 mmol/L   CO2 25 22 - 32 mmol/L   Glucose, Bld 129 (H) 65 - 99 mg/dL   BUN 17 6 - 20 mg/dL   Creatinine, Ser 1.01 (H) 0.44 - 1.00 mg/dL   Calcium 7.9 (L) 8.9 - 10.3 mg/dL   Total Protein 5.6 (L) 6.5 - 8.1 g/dL   Albumin 3.1 (L) 3.5 - 5.0 g/dL   AST 199 (H) 15 - 41 U/L   ALT 206 (H) 14 - 54 U/L   Alkaline Phosphatase 85 38 - 126 U/L   Total Bilirubin 2.0 (H) 0.3 - 1.2 mg/dL   GFR calc non Af Amer 56 (L) >60 mL/min   GFR calc Af Amer >60 >60 mL/min    Comment: (NOTE) The eGFR has been calculated using the CKD EPI equation. This calculation has not been validated in all clinical situations. eGFR's persistently <60 mL/min signify possible Chronic Kidney Disease.    Anion gap 5 5 - 15  Protime-INR     Status: None   Collection Time: 10/25/15  4:17 AM  Result Value Ref Range   Prothrombin Time 14.8 11.6 - 15.2 seconds   INR 1.14 0.00 - 1.49   US Abdomen Complete  10/24/2015  CLINICAL DATA:  Right upper quadrant pain for 1 day EXAM: ABDOMEN ULTRASOUND COMPLETE COMPARISON:  None. FINDINGS: Gallbladder: No cholelithiasis. Distended gallbladder. Trace amount of pericholecystic fluid. Normal gallbladder wall thickness measuring 1.5 mm. Common bile duct: Diameter: Dilated common bile duct measuring 16.4 mm with an 8 mm echogenic focus within the common bile duct concerning for choledocholithiasis. Liver: No focal lesion identified. Increased hepatic parenchymal echogenicity. IVC: No abnormality  visualized. Pancreas: Visualized portion unremarkable. Spleen: Size and appearance within normal limits. Right Kidney: Length: 10.4 cm. Mild renal cortical thinning. Echogenicity within normal limits. No mass or hydronephrosis visualized. Left Kidney: Length: 10.9 cm. Echogenicity within normal limits. 1.9 x 1.1 x 2.4 cm and anechoic left renal mass most consistent with a cyst. No hydronephrosis visualized. Abdominal aorta: No aneurysm visualized. Other findings: None. IMPRESSION: 1. Dilated common bile duct measuring 16.4 mm with an 8 mm echogenic focus within the common bile duct concerning for choledocholithiasis. 2. Distended gallbladder with a trace amount of pericholecystic fluid. No cholelithiasis. Electronically Signed   By: Kathreen Devoid   On: 10/24/2015 15:53   Dg Ercp Biliary & Pancreatic Ducts  10/25/2015  CLINICAL DATA:  70 year old female with a history of known common biliary duct stones. EXAM: ERCP TECHNIQUE: Multiple spot images obtained with the fluoroscopic device and submitted for interpretation post-procedure. FLUOROSCOPY TIME:  Fluoroscopy Time:  2 minutes 23 seconds Number of Acquired Images: COMPARISON:  Ultrasound 10/24/2015 FINDINGS: Limited intraoperative fluoroscopic spot images during ERCP. Initial images demonstrate endoscope projecting over the upper abdomen with cannulation of the ampulla. Incomplete opacification of the extrahepatic biliary system. Cine clip demonstrates balloon basket within the distal common bile duct. Final image demonstrates no large filling defect of the common bile duct. IMPRESSION: Limited intraoperative images during ERCP with balloon basket present within the common bile duct. Please refer to the dictated operative report for full details of intraoperative findings and procedure. Signed, York Cerise  Pasty Arch, DO [facility] Radiology Electronically Signed   By: Corrie Mckusick D.O.   On: 10/25/2015 14:03      Assessment/Plan Choledocholithiasis s/p successful ERCP - Dr. Dalene Carrow GI Gallstone pancreatitis Leukocytosis - 11.1 -Plan for lap chole in the next 1-2 days depending on clinical improvement and pending no post-ercp complications -NPO after MN, bowel rest, IVF, pain control, antiemetics, antibiotics (Zosyn Day #2) -Ambulate and IS -Repeat labs in AM -Doubt she needs cardiac clearance, medicine team agrees.  She works out at [employer] often and walks on the treadmill or uses the elliptical. -Discussed risks and benefits with the patient, but Dr. Johney Maine will review with her since she was very sleepy during my explanation.  Her daughter was at bedside and asked very good questions.    COPD? - don't see where this is substantiated in her chart H/o blader tack & hysterectomy with post-operative hypoxemia - due to atelectasis per notes, NOT on home o2 or medications DVT Proph - SCD's, no lovenox after MN (not currently ordered) FEN - on regular diet per GI, but if pain returns back off to NPO Disp - OR in the next few days - earliest tomorrow   Nat Christen, [facility] Surgery 10/25/2015, 3:10 PM Pager: [phone] (7am - 4:30pm M-F; 7am - 11:30am Sa/Su)

## 2015-10-25 NOTE — Transfer of Care (Signed)
Immediate Anesthesia Transfer of Care Note  Patient: Kelly [name]  Procedure(s) Performed: Procedure(s): ENDOSCOPIC RETROGRADE CHOLANGIOPANCREATOGRAPHY (ERCP) (N/A)  Patient Location: PACU  Anesthesia Type:General  Level of Consciousness: awake, alert  and oriented  Airway & Oxygen Therapy: Patient Spontanous Breathing and Patient connected to face mask oxygen  Post-op Assessment: Report given to RN and Post -op Vital signs reviewed and stable  Post vital signs: Reviewed and stable  Last Vitals:  Filed Vitals:   10/25/15 0623 10/25/15 1017  BP: 101/62 118/55  Pulse: 89 64  Temp: 37.1 C 37.1 C  Resp: 20 17    Last Pain:  Filed Vitals:   10/25/15 1034  PainSc: 0-No pain         Complications: No apparent anesthesia complications

## 2015-10-25 NOTE — H&P (View-Only) (Signed)
Referring Provider: No ref. provider found Primary Care Physician:  Suzanna Obey, MD Primary Gastroenterologist:  Althia Forts  Reason for Consultation:  Gallstone pancreatitis  HPI: Kelly [name] is a 70 y.o. female with medical history significant of COPD, who presented to the ED today with complaints of persistent but worsening right upper quadrant abdominal pain since she woke up this morning. Pain is described as colicky, [id number] at its worst without any radiation. No particular aggravating or relieving factors. Pain is associated with 2- 3 episodes of nonbilious and nonbloody vomiting. Further workup in the ED revealed choledocholithiasis and gallstone pancreatitis.  Interestingly, for the past 1 year or so, patient has had occasional right upper quadrant abdominal pain that lasts for 10-15 minutes and resolves on its own.  She says that she actually has episodes of this almost weekly.  ED Course: Found to have lipase of 747, leukocytosis of 14,000, abdominal ultrasound showed a dilated CBD of 16.4 mm with evidence of choledocholithiasis.  She is being admitted to the hospitalist service and started on antibiotics.   History reviewed. No pertinent past medical history.  History reviewed. No pertinent past surgical history.  Prior to Admission medications   Medication Sig Start Date End Date Taking? Authorizing Provider  Biotin 5 MG TABS Take 5 mg by mouth daily.   Yes Historical Provider, MD    Current Facility-Administered Medications  Medication Dose Route Frequency Provider Last Rate Last Dose  . fentaNYL (SUBLIMAZE) injection 50 mcg  50 mcg Intravenous Q20 Min PRN Davonna Belling, MD   50 mcg at 10/24/15 1356   Current Outpatient Prescriptions  Medication Sig Dispense Refill  . Biotin 5 MG TABS Take 5 mg by mouth daily.      Allergies as of 10/24/2015 - Review Complete 10/24/2015  Allergen Reaction Noted  . Sulfa antibiotics Rash 10/24/2015    No family history on  file.  Social History   Social History  . Marital Status: Married    Spouse Name: N/A  . Number of Children: N/A  . Years of Education: N/A   Occupational History  . Not on file.   Social History Main Topics  . Smoking status: Former Smoker    Quit date: 06/02/2009  . Smokeless tobacco: Not on file  . Alcohol Use: Not on file  . Drug Use: Not on file  . Sexual Activity: Not on file   Other Topics Concern  . Not on file   Social History Narrative  . No narrative on file    Review of Systems: Ten point ROS is O/W negative except as mentioned in HPI.  Physical Exam: Vital signs in last 24 hours: Temp:  [97.7 F (36.5 C)] 97.7 F (36.5 C) (05/24 1348) Pulse Rate:  [68] 68 (05/24 1348) Resp:  [18] 18 (05/24 1348) BP: (142)/(72) 142/72 mmHg (05/24 1348) SpO2:  [94 %] 94 % (05/24 1348)   General:  Alert, Well-developed, well-nourished, pleasant and cooperative in NAD Head:  Normocephalic and atraumatic. Eyes:  Sclera clear, no icterus.  Conjunctiva pink. Ears:  Normal auditory acuity. Mouth:  No deformity or lesions.   Lungs:  Clear throughout to auscultation.  No wheezes, crackles, or rhonchi.  Heart:  Regular rate and rhythm; no murmurs, clicks, rubs, or gallops. Abdomen:  Soft, non-distended.  BS present.  Mild epigastric TTP.   Rectal:  Deferred.  Msk:  Symmetrical without gross deformities. Pulses:  Normal pulses noted. Extremities:  Without clubbing or edema. Neurologic:  Alert and oriented  x 4;  grossly normal neurologically. Skin:  Intact without significant lesions or rashes. Psych:  Alert and cooperative. Normal mood and affect.  Lab Results:  Recent Labs  10/24/15 1359  WBC 14.3*  HGB 12.9  HCT 38.7  PLT 274   BMET  Recent Labs  10/24/15 1359  NA 140  K 4.0  CL 108  CO2 26  GLUCOSE 151*  BUN 16  CREATININE 1.07*  CALCIUM 8.8*   LFT  Recent Labs  10/24/15 1359  PROT 6.7  ALBUMIN 3.9  AST 78*  ALT 53  ALKPHOS 76  BILITOT  1.4*   Studies/Results: US Abdomen Complete  10/24/2015  CLINICAL DATA:  Right upper quadrant pain for 1 day EXAM: ABDOMEN ULTRASOUND COMPLETE COMPARISON:  None. FINDINGS: Gallbladder: No cholelithiasis. Distended gallbladder. Trace amount of pericholecystic fluid. Normal gallbladder wall thickness measuring 1.5 mm. Common bile duct: Diameter: Dilated common bile duct measuring 16.4 mm with an 8 mm echogenic focus within the common bile duct concerning for choledocholithiasis. Liver: No focal lesion identified. Increased hepatic parenchymal echogenicity. IVC: No abnormality visualized. Pancreas: Visualized portion unremarkable. Spleen: Size and appearance within normal limits. Right Kidney: Length: 10.4 cm. Mild renal cortical thinning. Echogenicity within normal limits. No mass or hydronephrosis visualized. Left Kidney: Length: 10.9 cm. Echogenicity within normal limits. 1.9 x 1.1 x 2.4 cm and anechoic left renal mass most consistent with a cyst. No hydronephrosis visualized. Abdominal aorta: No aneurysm visualized. Other findings: None. IMPRESSION: 1. Dilated common bile duct measuring 16.4 mm with an 8 mm echogenic focus within the common bile duct concerning for choledocholithiasis. 2. Distended gallbladder with a trace amount of pericholecystic fluid. No cholelithiasis. Electronically Signed   By: Kathreen Devoid   On: 10/24/2015 15:53   IMPRESSION:  -70 year old female with what appears to be gallstone pancreatitis with a lipase of 747 and ultrasound showing common bile duct measuring 16.4 mm with suspected choledocholithiasis. -Leukocytosis:  Secondary to above.  PLAN: -Patient being admitted to the hospitalist service to receive IV fluids, bowel rest, pain control, antiemetics. She is being started on antibiotics. Would trend LFTs. She will undergo ERCP with stone removal on 5/25.  ZEHR, JESSICA D.  10/24/2015, 4:18 PM  Pager number  [phone]     ________________________________________________________________________  Velora Heckler GI MD note:  I personally examined the patient, reviewed the data and agree with the assessment and plan described above.  Pretty clear CBD stones, dilated duct on today's [place].  Planning for ERCP tomorrow. She will need surgical consult for eventual GB resection as well.   Owens Loffler, MD [facility] Gastroenterology Pager [phone]

## 2015-10-25 NOTE — Op Note (Signed)
[facility] Patient Name: Kelly [name] Procedure Date: 10/25/2015 MRN: [id number] Attending MD: Milus Banister , MD Date of Birth: 1946-05-23 CSN: [id number] Age: 70 Admit Type: Inpatient Procedure:                ERCP Indications:              Bile duct stone on Ultrasound Providers:                Milus Banister, MD, Tory Emerald, RN, Vista Lawman, RN, Elspeth Cho, Technician Referring MD:              Medicines:                General Anesthesia Complications:            No immediate complications. Estimated blood loss:                            None Estimated Blood Loss:     Estimated blood loss: none. Procedure:                Pre-Anesthesia Assessment:                           - Prior to the procedure, a History and Physical                            was performed, and patient medications and                            allergies were reviewed. The patient's tolerance of                            previous anesthesia was also reviewed. The risks                            and benefits of the procedure and the sedation                            options and risks were discussed with the patient.                            All questions were answered, and informed consent                            was obtained. Prior Anticoagulants: The patient has                            taken no previous anticoagulant or antiplatelet                            agents. ASA Grade Assessment: II - A patient with  mild systemic disease. After reviewing the risks                            and benefits, the patient was deemed in                            satisfactory condition to undergo the procedure.                           After obtaining informed consent, the scope was                            passed under direct vision. Throughout the                            procedure, the patient's blood pressure, pulse,  and                            oxygen saturations were monitored continuously. The                            [id number]) scope was introduced through                            the mouth, and used to inject contrast into and                            used to inject contrast into the bile duct. The                            ERCP was accomplished without difficulty. The                            patient tolerated the procedure well. Scope In: Scope Out: Findings:      The scout film was normal. The esophagus was successfully intubated       under direct vision. The scope was advanced to a normal major papilla in       the descending duodenum without detailed examination of the pharynx,       larynx and associated structures, and upper GI tract. The upper GI tract       was grossly normal. The bile duct was deeply cannulated with 44 [employer]       over .035 hydrawire and contrast was injected. The main bile duct was       diffusely dilated, uncertain significance. The largest diameter was 15       mm. There were no obvious mobile filling defects but the distal bile       duct had a smooth stenosis, about 1cm in length. I felt this may       represent mild pancreatic edema and perhaps stones in the distal duct.       Given clear CBD stones on [place] a biliary sphincterotomy was made with a       monofilament traction (standard) sphincterotome. There was no       post-sphincterotomy bleeding. The biliary tree was swept  with first a       9-93mm balloon and then a 15-18 mm balloon starting at the bifurcation.       With the larger balloon 3 brown, multifacets round (4-16mm) stones were       delivered into the duodenum. The balloon did not have to be deflated to       pass through the sphincterotomy. Completion, occlusion cholangiogram       showed persisent biliary dilation, the contrast was slow to empty but       balloon pulled through the sphincterotomy easily. I do not think there        is underlying fixed stricture but rather that there is edematous       swelling around the distal bile duct from her mild gallstone related       pancreatitis. Impression:               Choledocholithiasis, treated with biliary                            sphincterotomy and balloon sweeping.                           Slow to empty bile duct, mild stenosis at distal                            duct which I feel is related to mild surrounding                            pancreatic edema (the 15-42mm balloon rather easily                            pulled through the stenosis, sphincterotomy). Moderate Sedation:      N/A- Per Anesthesia Care, zosyn IV, indomethacin 100mg  PR Recommendation:           - Return patient to hospital ward for ongoing care.                           - Resume previous diet.                           - Continue present medications.                           - Will consult general surgery to discuss time of                            cholecystectomy. Procedure Code(s):        --- Professional ---                           [phone], Endoscopic retrograde                            cholangiopancreatography (ERCP); with removal of                            calculi/debris from biliary/pancreatic duct(s)  43262, Endoscopic retrograde                            cholangiopancreatography (ERCP); with                            sphincterotomy/papillotomy Diagnosis Code(s):        --- Professional ---                           K80.51, Calculus of bile duct without cholangitis                            or cholecystitis with obstruction                           K83.8, Other specified diseases of biliary tract CPT copyright 2016 [employer]. All rights reserved. The codes documented in this report are preliminary and upon coder review may  be revised to meet current compliance requirements. Milus Banister, MD 10/25/2015 11:50:07  AM This report has been signed electronically. Number of Addenda: 0

## 2015-10-26 ENCOUNTER — Inpatient Hospital Stay (HOSPITAL_COMMUNITY): Payer: Medicare Other | Admitting: Anesthesiology

## 2015-10-26 ENCOUNTER — Encounter (HOSPITAL_COMMUNITY)
Admission: EM | Disposition: A | Discharge status: Home / Self Care | Payer: Self-pay | Source: Home / Self Care | Attending: Internal Medicine

## 2015-10-26 ENCOUNTER — Inpatient Hospital Stay (HOSPITAL_COMMUNITY): Payer: Medicare Other

## 2015-10-26 HISTORY — PX: CHOLECYSTECTOMY: SHX55

## 2015-10-26 LAB — COMPREHENSIVE METABOLIC PANEL
ALT: 138 U/L — ABNORMAL HIGH (ref 14–54)
AST: 76 U/L — ABNORMAL HIGH (ref 15–41)
Albumin: 2.8 g/dL — ABNORMAL LOW (ref 3.5–5.0)
Alkaline Phosphatase: 77 U/L (ref 38–126)
Anion gap: 4 — ABNORMAL LOW (ref 5–15)
BUN: 19 mg/dL (ref 6–20)
CO2: 24 mmol/L (ref 22–32)
Calcium: 7.7 mg/dL — ABNORMAL LOW (ref 8.9–10.3)
Chloride: 111 mmol/L (ref 101–111)
Creatinine, Ser: 0.83 mg/dL (ref 0.44–1.00)
GFR calc Af Amer: >60 mL/min (ref >60)
GFR calc non Af Amer: >60 mL/min (ref >60)
Glucose, Bld: 119 mg/dL — ABNORMAL HIGH (ref 65–99)
Potassium: 3.8 mmol/L (ref 3.5–5.1)
Sodium: 139 mmol/L (ref 135–145)
Total Bilirubin: 1.3 mg/dL — ABNORMAL HIGH (ref 0.3–1.2)
Total Protein: 5.4 g/dL — ABNORMAL LOW (ref 6.5–8.1)

## 2015-10-26 LAB — LIPASE, BLOOD: Lipase: 20 U/L (ref 11–51)

## 2015-10-26 LAB — CBC
HCT: 33.2 % — ABNORMAL LOW (ref 36.0–46.0)
Hemoglobin: 10.9 g/dL — ABNORMAL LOW (ref 12.0–15.0)
MCH: 30.1 pg (ref 26.0–34.0)
MCHC: 32.8 g/dL (ref 30.0–36.0)
MCV: 91.7 fL (ref 78.0–100.0)
Platelets: 190 10*3/uL (ref 150–400)
RBC: 3.62 MIL/uL — ABNORMAL LOW (ref 3.87–5.11)
RDW: 12.8 % (ref 11.5–15.5)
WBC: 8 10*3/uL (ref 4.0–10.5)

## 2015-10-26 LAB — MRSA PCR SCREENING: MRSA by PCR: NEGATIVE

## 2015-10-26 IMAGING — RF DG CHOLANGIOGRAM OPERATIVE
1 series · 12 of 12 positions shown · non-contrast
Comparison: [DATE]

CLINICAL DATA: Intraoperative cholangiogram

EXAM:
INTRAOPERATIVE CHOLANGIOGRAM
TECHNIQUE: Cholangiographic images from the C-arm fluoroscopic device were
submitted for interpretation post-operatively. Please see the
procedural report for the amount of contrast and the fluoroscopy
time utilized.

[Series 1: run · 4 acquisitions, 12 frames shown]
[im 1/4]
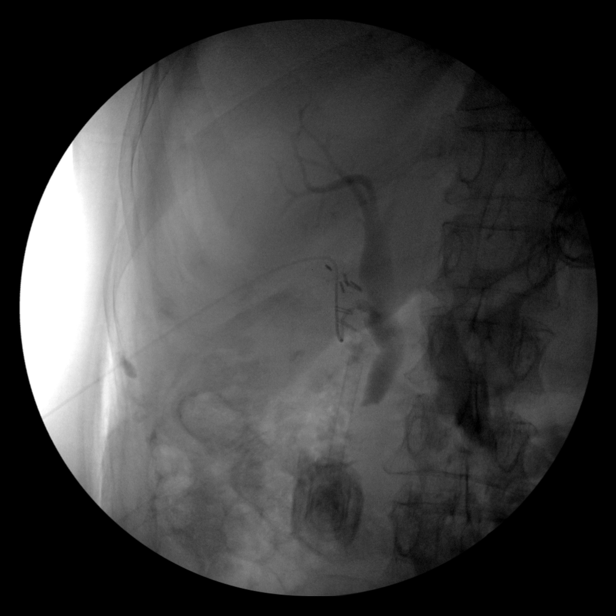
[im 1/4]
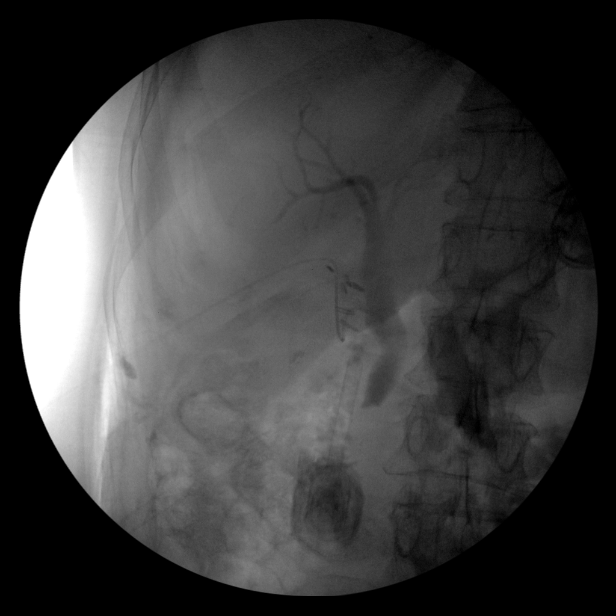
[im 1/4]
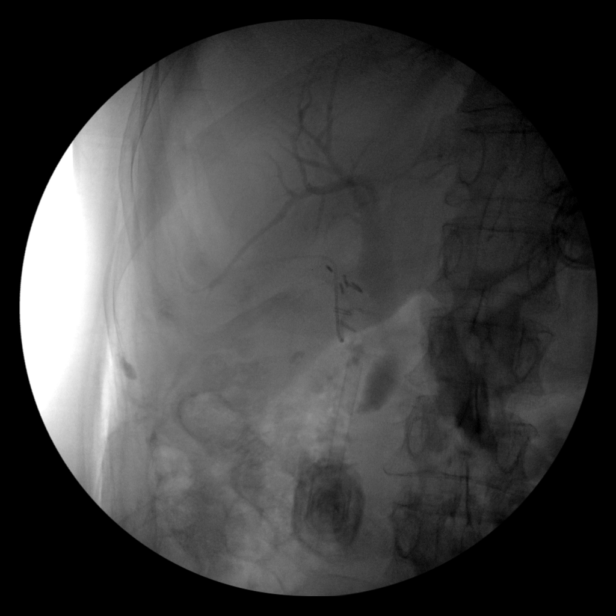
[im 1/4]
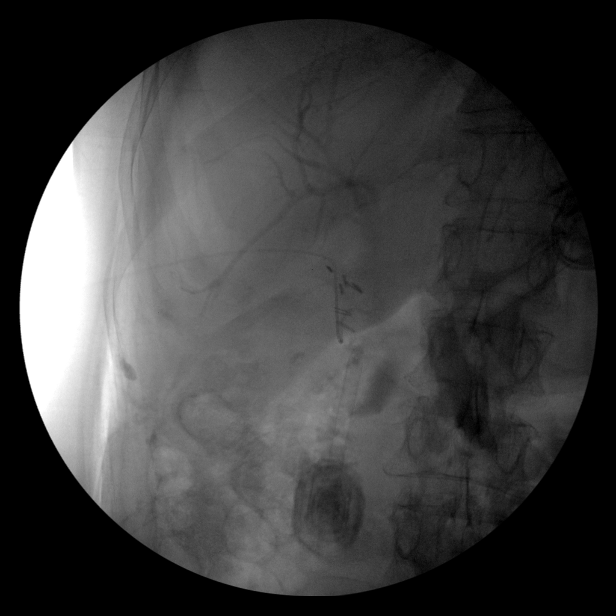
[im 2/4]
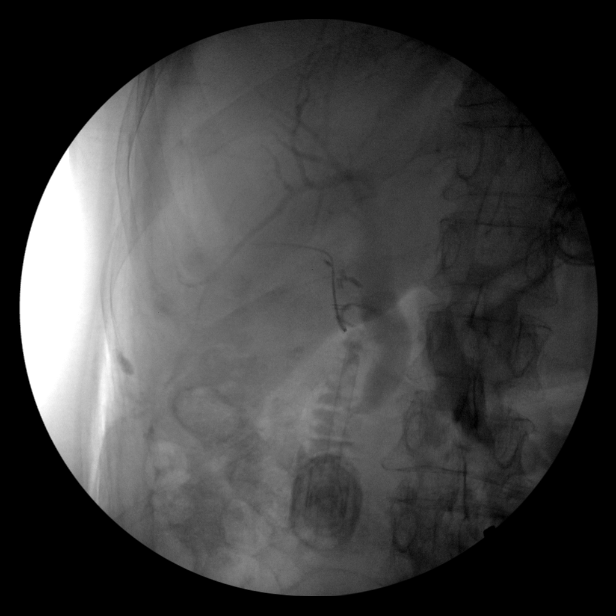
[im 2/4]
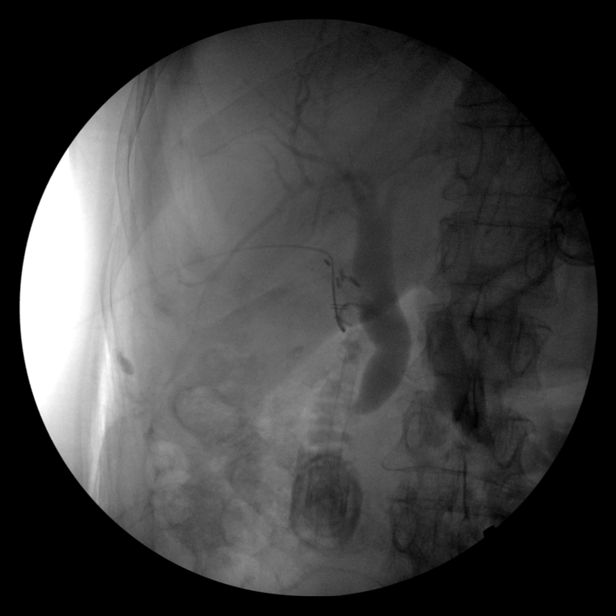
[im 2/4]
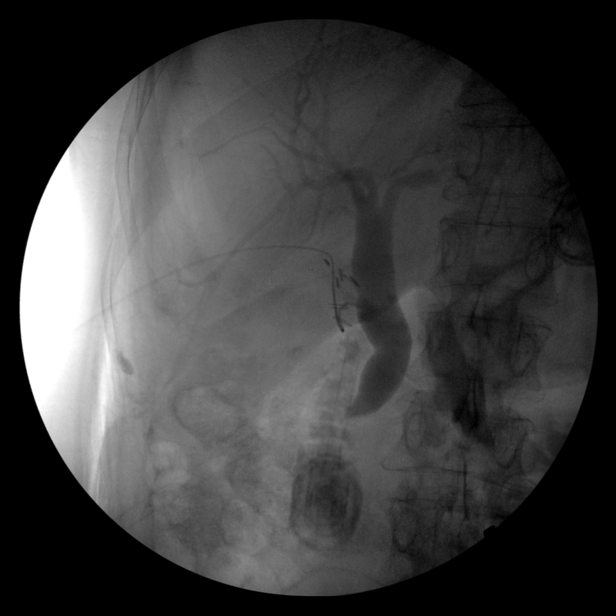
[im 3/4]
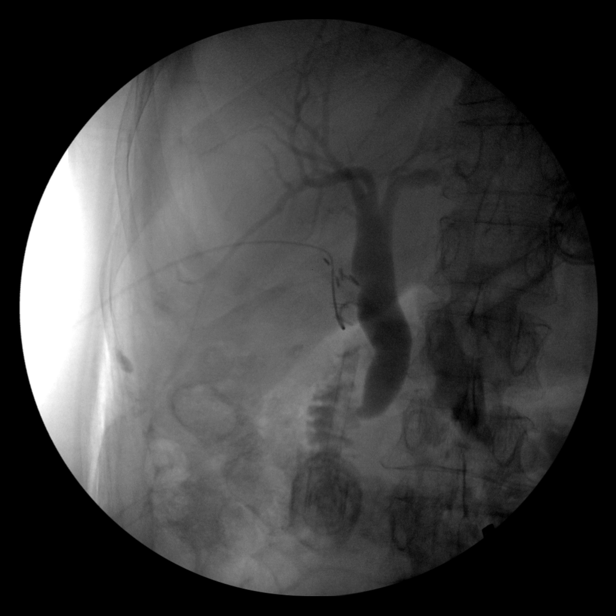
[im 3/4]
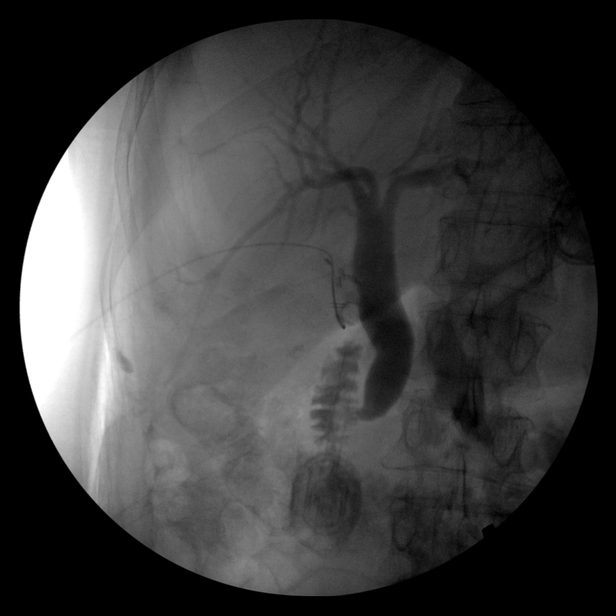
[im 3/4]
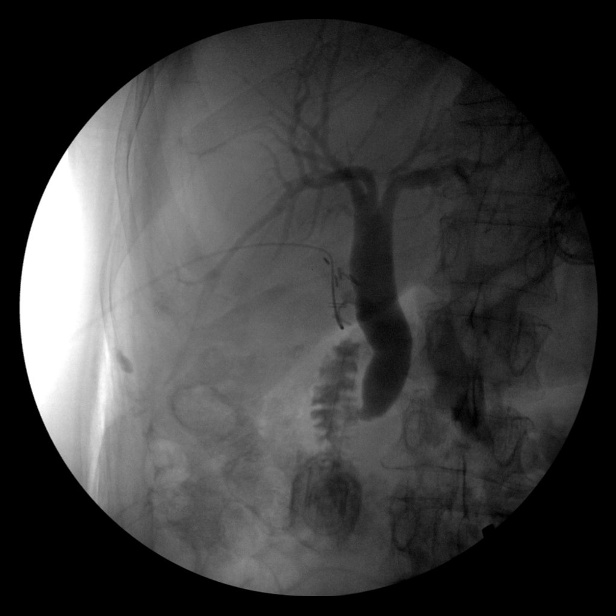
[im 3/4]
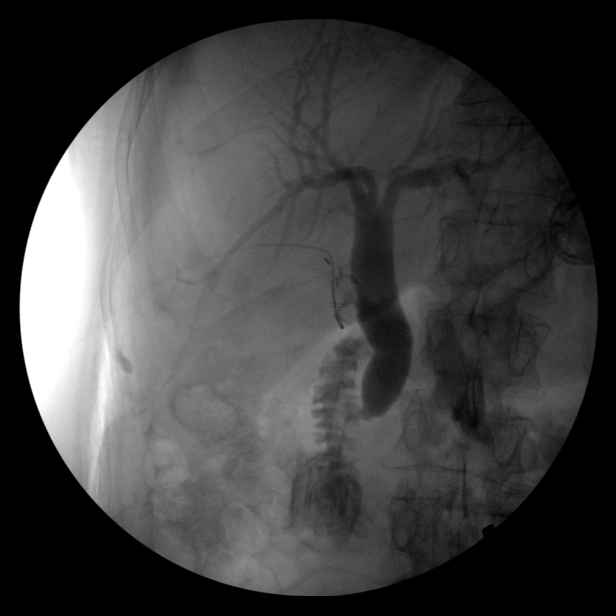
[im 4/4]
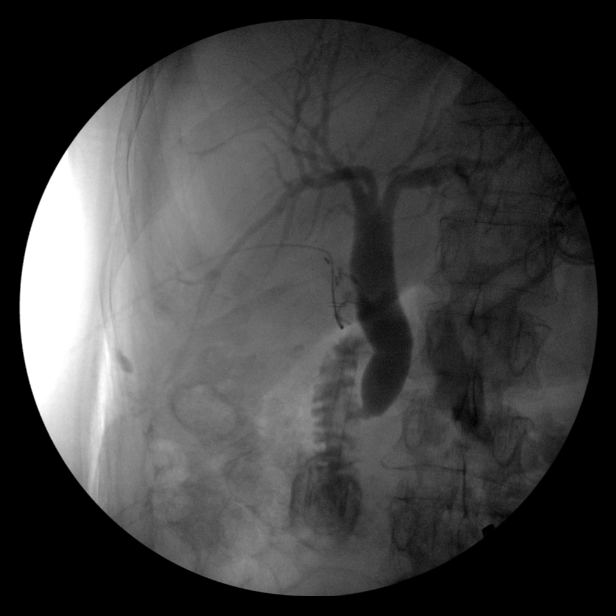

[12 of 12 positions shown; findings below may reference images not displayed]

FINDINGS: Common bile duct is well visualized as are the intrahepatic
radicles. No filling defects are seen. Free flow contrast into the
duodenum is seen.
IMPRESSION: No retained filling defects identified.

## 2015-10-26 SURGERY — LAPAROSCOPIC CHOLECYSTECTOMY WITH INTRAOPERATIVE CHOLANGIOGRAM
Anesthesia: General | Site: Abdomen

## 2015-10-26 MED ORDER — ONDANSETRON HCL 4 MG/2ML IJ SOLN
INTRAMUSCULAR | Status: AC
  Filled 2015-10-26: qty 2

## 2015-10-26 MED ORDER — 0.9 % SODIUM CHLORIDE (POUR BTL) OPTIME
TOPICAL | Status: DC | PRN
  Administered 2015-10-26: 1000 mL

## 2015-10-26 MED ORDER — PHENOL 1.4 % MT LIQD
2.0000 | OROMUCOSAL | Status: DC | PRN
  Filled 2015-10-26: qty 177

## 2015-10-26 MED ORDER — MEPERIDINE HCL 50 MG/ML IJ SOLN: 6.2500 mg | INTRAMUSCULAR | Status: DC | PRN

## 2015-10-26 MED ORDER — FENTANYL CITRATE (PF) 100 MCG/2ML IJ SOLN
INTRAMUSCULAR | Status: AC
  Filled 2015-10-26: qty 2

## 2015-10-26 MED ORDER — BUPIVACAINE-EPINEPHRINE 0.25% -1:200000 IJ SOLN
INTRAMUSCULAR | Status: AC
  Filled 2015-10-26: qty 1

## 2015-10-26 MED ORDER — PROPOFOL 10 MG/ML IV BOLUS
INTRAVENOUS | Status: AC
  Filled 2015-10-26: qty 20

## 2015-10-26 MED ORDER — FENTANYL CITRATE (PF) 100 MCG/2ML IJ SOLN
25.0000 ug | INTRAMUSCULAR | Status: DC | PRN
  Administered 2015-10-26: 50 ug via INTRAVENOUS

## 2015-10-26 MED ORDER — PROPOFOL 10 MG/ML IV BOLUS
INTRAVENOUS | Status: DC | PRN
  Administered 2015-10-26: 160 mg via INTRAVENOUS

## 2015-10-26 MED ORDER — NAPROXEN 500 MG PO TABS
500.0000 mg | ORAL_TABLET | Freq: Two times a day (BID) | ORAL | Status: DC | PRN
  Filled 2015-10-26: qty 1

## 2015-10-26 MED ORDER — SUGAMMADEX SODIUM 200 MG/2ML IV SOLN
INTRAVENOUS | Status: AC
  Filled 2015-10-26: qty 2

## 2015-10-26 MED ORDER — BISMUTH SUBSALICYLATE 262 MG/15ML PO SUSP
30.0000 mL | Freq: Three times a day (TID) | ORAL | Status: DC | PRN
  Filled 2015-10-26: qty 118

## 2015-10-26 MED ORDER — LACTATED RINGERS IV SOLN
INTRAVENOUS | Status: DC | PRN
  Administered 2015-10-26: 20:00:00 via INTRAVENOUS

## 2015-10-26 MED ORDER — PSYLLIUM 95 % PO PACK
1.0000 | PACK | Freq: Every day | ORAL | Status: DC
  Administered 2015-10-27: 1 via ORAL
  Filled 2015-10-26: qty 1

## 2015-10-26 MED ORDER — OXYCODONE HCL 5 MG PO TABS
5.0000 mg | ORAL_TABLET | ORAL | Status: DC | PRN
  Administered 2015-10-27 (×2): 10 mg via ORAL
  Filled 2015-10-26 (×2): qty 2

## 2015-10-26 MED ORDER — BUPIVACAINE LIPOSOME 1.3 % IJ SUSP
20.0000 mL | INTRAMUSCULAR | Status: AC
  Administered 2015-10-26: 20 mL
  Filled 2015-10-26: qty 20

## 2015-10-26 MED ORDER — ONDANSETRON HCL 4 MG/2ML IJ SOLN
INTRAMUSCULAR | Status: DC | PRN
  Administered 2015-10-26 (×2): 4 mg via INTRAVENOUS

## 2015-10-26 MED ORDER — LIDOCAINE HCL (CARDIAC) 20 MG/ML IV SOLN
INTRAVENOUS | Status: AC
  Filled 2015-10-26: qty 5

## 2015-10-26 MED ORDER — MIDAZOLAM HCL 2 MG/2ML IJ SOLN
INTRAMUSCULAR | Status: AC
  Filled 2015-10-26: qty 2

## 2015-10-26 MED ORDER — ROCURONIUM BROMIDE 100 MG/10ML IV SOLN
INTRAVENOUS | Status: AC
  Filled 2015-10-26: qty 2

## 2015-10-26 MED ORDER — ONDANSETRON HCL 4 MG/2ML IJ SOLN: 4.0000 mg | Freq: Once | INTRAMUSCULAR | Status: DC

## 2015-10-26 MED ORDER — PHENYLEPHRINE HCL 10 MG/ML IJ SOLN
INTRAMUSCULAR | Status: DC | PRN
  Administered 2015-10-26: 80 ug via INTRAVENOUS

## 2015-10-26 MED ORDER — MAGIC MOUTHWASH
15.0000 mL | Freq: Four times a day (QID) | ORAL | Status: DC | PRN
  Filled 2015-10-26: qty 15

## 2015-10-26 MED ORDER — BUPIVACAINE-EPINEPHRINE 0.25% -1:200000 IJ SOLN
INTRAMUSCULAR | Status: DC | PRN
  Administered 2015-10-26: 25 mL

## 2015-10-26 MED ORDER — SODIUM CHLORIDE 0.9 % IV SOLN
INTRAVENOUS | Status: DC
  Administered 2015-10-26: 02:00:00 via INTRAVENOUS

## 2015-10-26 MED ORDER — LACTATED RINGERS IV BOLUS (SEPSIS): 1000.0000 mL | Freq: Three times a day (TID) | INTRAVENOUS | Status: DC | PRN

## 2015-10-26 MED ORDER — VITAMIN C 500 MG PO TABS
500.0000 mg | ORAL_TABLET | Freq: Two times a day (BID) | ORAL | Status: DC
  Administered 2015-10-27: 500 mg via ORAL
  Filled 2015-10-26 (×3): qty 1

## 2015-10-26 MED ORDER — STERILE WATER FOR IRRIGATION IR SOLN
Status: DC | PRN
  Administered 2015-10-26: 3000 mL

## 2015-10-26 MED ORDER — DIPHENHYDRAMINE HCL 50 MG/ML IJ SOLN: 12.5000 mg | Freq: Four times a day (QID) | INTRAMUSCULAR | Status: DC | PRN

## 2015-10-26 MED ORDER — IOPAMIDOL (ISOVUE-300) INJECTION 61%
INTRAVENOUS | Status: DC | PRN
  Administered 2015-10-26: 20 mL

## 2015-10-26 MED ORDER — FENTANYL CITRATE (PF) 100 MCG/2ML IJ SOLN
INTRAMUSCULAR | Status: DC | PRN
  Administered 2015-10-26 (×2): 50 ug via INTRAVENOUS

## 2015-10-26 MED ORDER — ROCURONIUM BROMIDE 100 MG/10ML IV SOLN
INTRAVENOUS | Status: DC | PRN
  Administered 2015-10-26: 50 mg via INTRAVENOUS

## 2015-10-26 MED ORDER — DEXAMETHASONE SODIUM PHOSPHATE 10 MG/ML IJ SOLN
INTRAMUSCULAR | Status: DC | PRN
  Administered 2015-10-26: 5 mg via INTRAVENOUS

## 2015-10-26 MED ORDER — DEXAMETHASONE SODIUM PHOSPHATE 10 MG/ML IJ SOLN
INTRAMUSCULAR | Status: AC
  Filled 2015-10-26: qty 1

## 2015-10-26 MED ORDER — IOPAMIDOL (ISOVUE-300) INJECTION 61%
INTRAVENOUS | Status: AC
  Filled 2015-10-26: qty 50

## 2015-10-26 MED ORDER — SODIUM CHLORIDE 0.9% FLUSH: 3.0000 mL | Freq: Two times a day (BID) | INTRAVENOUS | Status: DC

## 2015-10-26 MED ORDER — METOPROLOL TARTRATE 12.5 MG HALF TABLET
12.5000 mg | ORAL_TABLET | Freq: Two times a day (BID) | ORAL | Status: DC | PRN
  Filled 2015-10-26: qty 1

## 2015-10-26 MED ORDER — SACCHAROMYCES BOULARDII 250 MG PO CAPS
250.0000 mg | ORAL_CAPSULE | Freq: Two times a day (BID) | ORAL | Status: DC
  Administered 2015-10-27: 250 mg via ORAL
  Filled 2015-10-26 (×2): qty 1

## 2015-10-26 MED ORDER — LIP MEDEX EX OINT
1.0000 "application " | TOPICAL_OINTMENT | Freq: Two times a day (BID) | CUTANEOUS | Status: DC
  Administered 2015-10-27: 1 via TOPICAL
  Filled 2015-10-26: qty 7

## 2015-10-26 MED ORDER — SUGAMMADEX SODIUM 200 MG/2ML IV SOLN
INTRAVENOUS | Status: DC | PRN
  Administered 2015-10-26: 200 mg via INTRAVENOUS

## 2015-10-26 MED ORDER — PHENYLEPHRINE 40 MCG/ML (10ML) SYRINGE FOR IV PUSH (FOR BLOOD PRESSURE SUPPORT)
PREFILLED_SYRINGE | INTRAVENOUS | Status: AC
  Filled 2015-10-26: qty 10

## 2015-10-26 MED ORDER — LACTATED RINGERS IR SOLN
Status: DC | PRN
  Administered 2015-10-26: 1000 mL

## 2015-10-26 MED ORDER — LIDOCAINE HCL (CARDIAC) 20 MG/ML IV SOLN
INTRAVENOUS | Status: DC | PRN
  Administered 2015-10-26: 80 mg via INTRAVENOUS

## 2015-10-26 MED ORDER — HYDROMORPHONE HCL 1 MG/ML IJ SOLN
0.5000 mg | INTRAMUSCULAR | Status: DC | PRN
  Administered 2015-10-26: 1 mg via INTRAVENOUS
  Filled 2015-10-26: qty 1

## 2015-10-26 MED ORDER — BIOTIN 5 MG PO TABS: 5.0000 mg | ORAL_TABLET | Freq: Every day | ORAL | Status: DC

## 2015-10-26 MED ORDER — METHOCARBAMOL 1000 MG/10ML IJ SOLN
1000.0000 mg | Freq: Four times a day (QID) | INTRAVENOUS | Status: DC | PRN
  Filled 2015-10-26: qty 10

## 2015-10-26 MED ORDER — BISACODYL 10 MG RE SUPP: 10.0000 mg | Freq: Two times a day (BID) | RECTAL | Status: DC | PRN

## 2015-10-26 MED ORDER — SODIUM CHLORIDE 0.9 % IV SOLN: 250.0000 mL | INTRAVENOUS | Status: DC | PRN

## 2015-10-26 MED ORDER — ALUM & MAG HYDROXIDE-SIMETH 200-200-20 MG/5ML PO SUSP: 30.0000 mL | Freq: Four times a day (QID) | ORAL | Status: DC | PRN

## 2015-10-26 MED ORDER — OXYCODONE HCL 5 MG PO TABS: 5.0000 mg | ORAL_TABLET | Freq: Four times a day (QID) | ORAL | Status: DC | PRN

## 2015-10-26 MED ORDER — SODIUM CHLORIDE 0.9% FLUSH: 3.0000 mL | INTRAVENOUS | Status: DC | PRN

## 2015-10-26 MED ORDER — SODIUM CHLORIDE 0.9 % IV SOLN
25.0000 mg | Freq: Four times a day (QID) | INTRAVENOUS | Status: DC | PRN
  Filled 2015-10-26: qty 1

## 2015-10-26 MED ORDER — MENTHOL 3 MG MT LOZG: 1.0000 | LOZENGE | OROMUCOSAL | Status: DC | PRN

## 2015-10-26 SURGICAL SUPPLY — 37 items
APPLIER CLIP 5 13 M/L LIGAMAX5 (MISCELLANEOUS)
APPLIER CLIP ROT 10 11.4 M/L (STAPLE)
CABLE HIGH FREQUENCY MONO STRZ (ELECTRODE) IMPLANT
CLIP APPLIE 5 13 M/L LIGAMAX5 (MISCELLANEOUS) IMPLANT
CLIP APPLIE ROT 10 11.4 M/L (STAPLE) IMPLANT
COVER MAYO STAND STRL (DRAPES) ×2 IMPLANT
COVER SURGICAL LIGHT HANDLE (MISCELLANEOUS) ×2 IMPLANT
DECANTER SPIKE VIAL GLASS SM (MISCELLANEOUS) ×2 IMPLANT
DEVICE TROCAR PUNCTURE CLOSURE (ENDOMECHANICALS) ×2 IMPLANT
DRAPE C-ARM 42X120 X-RAY (DRAPES) ×2 IMPLANT
DRAPE LAPAROSCOPIC ABDOMINAL (DRAPES) ×2 IMPLANT
DRAPE UTILITY XL STRL (DRAPES) ×2 IMPLANT
DRAPE WARM FLUID 44X44 (DRAPE) ×2 IMPLANT
DRSG TEGADERM 2-3/8X2-3/4 SM (GAUZE/BANDAGES/DRESSINGS) ×6 IMPLANT
DRSG TEGADERM 4X4.75 (GAUZE/BANDAGES/DRESSINGS) ×2 IMPLANT
ELECT REM PT RETURN 9FT ADLT (ELECTROSURGICAL) ×2
ELECTRODE REM PT RTRN 9FT ADLT (ELECTROSURGICAL) ×1 IMPLANT
ENDOLOOP SUT PDS II  0 18 (SUTURE) ×1
ENDOLOOP SUT PDS II 0 18 (SUTURE) ×1 IMPLANT
GLOVE ECLIPSE 8.0 STRL XLNG CF (GLOVE) ×2 IMPLANT
GLOVE INDICATOR 8.0 STRL GRN (GLOVE) ×2 IMPLANT
GOWN STRL REUS W/TWL XL LVL3 (GOWN DISPOSABLE) ×4 IMPLANT
KIT BASIN OR (CUSTOM PROCEDURE TRAY) ×2 IMPLANT
POSITIONER SURGICAL ARM (MISCELLANEOUS) IMPLANT
POUCH SPECIMEN RETRIEVAL 10MM (ENDOMECHANICALS) IMPLANT
SCISSORS LAP 5X35 DISP (ENDOMECHANICALS) ×2 IMPLANT
SET CHOLANGIOGRAPH MIX (MISCELLANEOUS) ×2 IMPLANT
SET IRRIG TUBING LAPAROSCOPIC (IRRIGATION / IRRIGATOR) ×2 IMPLANT
SLEEVE XCEL OPT CAN 5 100 (ENDOMECHANICALS) IMPLANT
SUT MNCRL AB 4-0 PS2 18 (SUTURE) ×2 IMPLANT
SUT PDS AB 1 CTX 36 (SUTURE) ×4 IMPLANT
SYR 20CC LL (SYRINGE) ×2 IMPLANT
TOWEL OR 17X26 10 PK STRL BLUE (TOWEL DISPOSABLE) ×2 IMPLANT
TOWEL OR NON WOVEN STRL DISP B (DISPOSABLE) ×2 IMPLANT
TRAY LAPAROSCOPIC (CUSTOM PROCEDURE TRAY) ×2 IMPLANT
TROCAR BLADELESS OPT 5 100 (ENDOMECHANICALS) ×2 IMPLANT
TROCAR XCEL NON-BLD 11X100MML (ENDOMECHANICALS) ×2 IMPLANT

## 2015-10-26 NOTE — Progress Notes (Signed)
[facility] Progress Note  1 Day Post-Op   Subjective: Pt in bed POD#1 from successful ERCP with stone removal from CBD by Dr. Ardis Hughs. Scheduled for lap chole today. Denies pain, N/V, fevers, chills. Urinating without hesitancy. Passing gas. No BM today. Attempted to discuss risks and benefits of surgery with patient again this morning - patient understand risks of bleeding, infection, conversion to open. She did not let me explain the rest - CVA, respiratory compromise, death, but she did talk with Jinny Blossom baird about risks and benefits yesterday and she states she is ready to proceed with the surgery.  Pt again stating "I WILL be leaving tomorrow, and I WILL be at my mother's 90[age] birthday".   Objective: Vital signs in last 24 hours: Temp:  [97.5 F (36.4 C)-98.8 F (37.1 C)] 98.6 F (37 C) (05/26 [id number]) Pulse Rate:  [50-69] 65 (05/26 0613) Resp:  [10-21] 16 (05/26 0613) BP: (108-125)/(50-71) 108/68 mmHg (05/26 0613) SpO2:  [88 %-95 %] 93 % (05/26 [id number]) Weight:  [77.111 kg (170 lb)] 77.111 kg (170 lb) (05/25 1017) Last BM Date: 10/25/15  Intake/Output from previous day: 05/25 0701 - 05/26 0700 In: 2181 [P.O.:480; I.V.:1588.5; IV Piggyback:112.5] Out: 1300 [Urine:1300] Intake/Output this shift: Total I/O In: 0  Out: 250 [Urine:250]  PE: Gen:  Alert, NAD, pleasant Card:  RRR, no M/G/R heard Pulm:  CTA, no W/R/R Abd: Soft, mild TTP of RUQ, ND, +BS Ext:  No erythema, edema, or tenderness  Lab Results:   Recent Labs  10/25/15 0417 10/26/15 0421  WBC 11.1* 8.0  HGB 11.8* 10.9*  HCT 35.9* 33.2*  PLT 246 190   BMET  Recent Labs  10/25/15 0417 10/26/15 0421  NA 141 139  K 4.1 3.8  CL 111 111  CO2 25 24  GLUCOSE 129* 119*  BUN 17 19  CREATININE 1.01* 0.83  CALCIUM 7.9* 7.7*   PT/INR  Recent Labs  10/25/15 0417  LABPROT 14.8  INR 1.14   CMP     Component Value Date/Time   NA 139 10/26/2015 0421   K 3.8 10/26/2015 0421   CL 111 10/26/2015  0421   CO2 24 10/26/2015 0421   GLUCOSE 119* 10/26/2015 0421   BUN 19 10/26/2015 0421   CREATININE 0.83 10/26/2015 0421   CALCIUM 7.7* 10/26/2015 0421   PROT 5.4* 10/26/2015 0421   ALBUMIN 2.8* 10/26/2015 0421   AST 76* 10/26/2015 0421   ALT 138* 10/26/2015 0421   ALKPHOS 77 10/26/2015 0421   BILITOT 1.3* 10/26/2015 0421   GFRNONAA >60 10/26/2015 0421   GFRAA >60 10/26/2015 0421   Lipase     Component Value Date/Time   LIPASE 20 10/26/2015 0421    Studies/Results: US Abdomen Complete  10/24/2015  CLINICAL DATA:  Right upper quadrant pain for 1 day EXAM: ABDOMEN ULTRASOUND COMPLETE COMPARISON:  None. FINDINGS: Gallbladder: No cholelithiasis. Distended gallbladder. Trace amount of pericholecystic fluid. Normal gallbladder wall thickness measuring 1.5 mm. Common bile duct: Diameter: Dilated common bile duct measuring 16.4 mm with an 8 mm echogenic focus within the common bile duct concerning for choledocholithiasis. Liver: No focal lesion identified. Increased hepatic parenchymal echogenicity. IVC: No abnormality visualized. Pancreas: Visualized portion unremarkable. Spleen: Size and appearance within normal limits. Right Kidney: Length: 10.4 cm. Mild renal cortical thinning. Echogenicity within normal limits. No mass or hydronephrosis visualized. Left Kidney: Length: 10.9 cm. Echogenicity within normal limits. 1.9 x 1.1 x 2.4 cm and anechoic left renal mass most consistent with a cyst. No  hydronephrosis visualized. Abdominal aorta: No aneurysm visualized. Other findings: None. IMPRESSION: 1. Dilated common bile duct measuring 16.4 mm with an 8 mm echogenic focus within the common bile duct concerning for choledocholithiasis. 2. Distended gallbladder with a trace amount of pericholecystic fluid. No cholelithiasis. Electronically Signed   By: Kathreen Devoid   On: 10/24/2015 15:53   Dg Ercp Biliary & Pancreatic Ducts  10/25/2015  CLINICAL DATA:  70 year old female with a history of known common  biliary duct stones. EXAM: ERCP TECHNIQUE: Multiple spot images obtained with the fluoroscopic device and submitted for interpretation post-procedure. FLUOROSCOPY TIME:  Fluoroscopy Time:  2 minutes 23 seconds Number of Acquired Images: COMPARISON:  Ultrasound 10/24/2015 FINDINGS: Limited intraoperative fluoroscopic spot images during ERCP. Initial images demonstrate endoscope projecting over the upper abdomen with cannulation of the ampulla. Incomplete opacification of the extrahepatic biliary system. Cine clip demonstrates balloon basket within the distal common bile duct. Final image demonstrates no large filling defect of the common bile duct. IMPRESSION: Limited intraoperative images during ERCP with balloon basket present within the common bile duct. Please refer to the dictated operative report for full details of intraoperative findings and procedure. Signed, Dulcy Fanny. Earleen Newport, DO [facility] Radiology Electronically Signed   By: Corrie Mckusick D.O.   On: 10/25/2015 14:03    Anti-infectives: Anti-infectives    Start     Dose/Rate Route Frequency Ordered Stop   10/25/15 0100  piperacillin-tazobactam (ZOSYN) IVPB 3.375 g     3.375 g 12.5 mL/hr over 240 Minutes Intravenous Every 8 hours 10/24/15 1659     10/24/15 1645  piperacillin-tazobactam (ZOSYN) IVPB 3.375 g     3.375 g 100 mL/hr over 30 Minutes Intravenous  Once 10/24/15 1640 10/24/15 1725       Assessment/Plan Choledocholithiasis s/p successful ERCP - Dr. Dalene Carrow GI Gallstone pancreatitis - Lipase WNL today - 20 U/L Leukocytosis - resolved; 8.0 -Lap Chole today with Dr. Johney Maine. -NPO, bowel rest, IVF, pain control, antiemetics, antibiotics (Zosyn Day #3) -Ambulate and IS  -Discussed risks and benefits with the patient.   COPD? - don't see where this is substantiated in her chart H/o blader tack & hysterectomy with post-operative hypoxemia - due to atelectasis per notes, NOT on  home o2 or medications DVT Proph - SCD's, lovenox held FEN - NPO, IVF Disp - OR today     LOS: 2 days    Jill Alexanders , [facility] Surgery 10/26/2015, 9:39 AM Pager: [phone] Mon-Fri 7:00 am-4:30 pm Sat-Sun 7:00 am-11:30 am

## 2015-10-26 NOTE — Progress Notes (Signed)
PROGRESS NOTE        PATIENT DETAILS Name: Kelly [name] Age: 70 y.o. Sex: female Date of Birth: 04-12-1946 Admit Date: 10/24/2015 Admitting Physician Evalee Mutton Kristeen Mans, MD [id number], Maudie Mercury, MD  Brief Narrative: Patient is a 70 y.o. female presented with right upper quadrant abdominal pain, found to have gallstone pancreatitis with choledocholithiasis. Underwent ERCP on 5/25, for laparoscopic cholecystectomy today.  Subjective: No further abdominal pain, was able to tolerate regular diet yesterday.   Assessment/Plan: Acute gallstone pancreatitis with Choledocholithiasis: Improved with no further abdominal pain. Unerwent ERCP with sphincterotomy and stone extraction 5/25.General Surgery planning laparoscopic cholecystectomy on 5/26. Patient has no prior cardiac history, she works out in a gym 3 days a week-is easily able to walk a mile or so without any distress. She is stable to proceed with surgery.  COPD: Stable-lungs clear with no rhonchi.  DVT Prophylaxis: SCD's  Code Status: Full code   Family Communication: None at bedside  Disposition Plan: Remain inpatient-home tomorrow  Antimicrobial agents: IV Zosyn 5/24>>  Procedures: ERCP 5/25  CONSULTS:  GI and general surgery  Time spent: 25 minutes-Greater than 50% of this time was spent in counseling, explanation of diagnosis, planning of further management, and coordination of care.  MEDICATIONS: Anti-infectives    Start     Dose/Rate Route Frequency Ordered Stop   10/25/15 0100  piperacillin-tazobactam (ZOSYN) IVPB 3.375 g     3.375 g 12.5 mL/hr over 240 Minutes Intravenous Every 8 hours 10/24/15 1659     10/24/15 1645  piperacillin-tazobactam (ZOSYN) IVPB 3.375 g     3.375 g 100 mL/hr over 30 Minutes Intravenous  Once 10/24/15 1640 10/24/15 1725      Scheduled Meds: . piperacillin-tazobactam (ZOSYN)  IV  3.375 g Intravenous Q8H   Continuous Infusions: . sodium chloride 50  mL/hr at 10/25/15 1153  . sodium chloride 10 mL/hr at 10/26/15 0626   PRN Meds:.albuterol, morphine injection, ondansetron **OR** ondansetron (ZOFRAN) IV, oxyCODONE   PHYSICAL EXAM: Vital signs: Filed Vitals:   10/25/15 1220 10/25/15 1400 10/25/15 2137 10/26/15 0613  BP: 125/59 121/62 112/50 108/68  Pulse: 55 50 54 65  Temp:  97.5 F (36.4 C) 98.1 F (36.7 C) 98.6 F (37 C)  TempSrc:  Oral Oral Oral  Resp: 15 17 16 16   Height:      Weight:      SpO2: 95% 90% 92% 93%   Filed Weights   10/24/15 1802 10/25/15 1017  Weight: 77.111 kg (170 lb) 77.111 kg (170 lb)   Body mass index is 25.09 kg/(m^2).   Gen Exam: Awake and alert with clear speech. Not in any distress Neck: Supple, No JVD.   Chest: B/L Clear.   CVS: S1 S2 Regular, no murmurs.  Abdomen: soft, BS +,Non tender RUQ, non distended.  Extremities: no edema, lower extremities warm to touch. Neurologic: Non Focal.   Skin: No Rash or lesions   Wounds: N/A.   LABORATORY DATA: CBC:  Recent Labs Lab 10/24/15 1359 10/25/15 0417 10/26/15 0421  WBC 14.3* 11.1* 8.0  HGB 12.9 11.8* 10.9*  HCT 38.7 35.9* 33.2*  MCV 87.8 91.1 91.7  PLT 274 246 [id number]    Basic Metabolic Panel:  Recent Labs Lab 10/24/15 1359 10/25/15 0417 10/26/15 0421  NA 140 141 139  K 4.0 4.1 3.8  CL 108 111 111  CO2 26  25 24  GLUCOSE 151* 129* 119*  BUN 16 17 19   CREATININE 1.07* 1.01* 0.83  CALCIUM 8.8* 7.9* 7.7*    GFR: Estimated Creatinine Clearance: 68.7 mL/min (by C-G formula based on Cr of 0.83).  Liver Function Tests:  Recent Labs Lab 10/24/15 1359 10/25/15 0417 10/26/15 0421  AST 78* 199* 76*  ALT 53 206* 138*  ALKPHOS 76 85 77  BILITOT 1.4* 2.0* 1.3*  PROT 6.7 5.6* 5.4*  ALBUMIN 3.9 3.1* 2.8*    Recent Labs Lab 10/24/15 1359 10/26/15 0421  LIPASE 747* 20   No results for input(s): AMMONIA in the last 168 hours.  Coagulation Profile:  Recent Labs Lab 10/25/15 0417  INR 1.14    Cardiac Enzymes: No  results for input(s): CKTOTAL, CKMB, CKMBINDEX, TROPONINI in the last 168 hours.  BNP (last 3 results) No results for input(s): PROBNP in the last 8760 hours.  HbA1C: No results for input(s): HGBA1C in the last 72 hours.  CBG: No results for input(s): GLUCAP in the last 168 hours.  Lipid Profile: No results for input(s): CHOL, HDL, LDLCALC, TRIG, CHOLHDL, LDLDIRECT in the last 72 hours.  Thyroid Function Tests: No results for input(s): TSH, T4TOTAL, FREET4, T3FREE, THYROIDAB in the last 72 hours.  Anemia Panel: No results for input(s): VITAMINB12, FOLATE, FERRITIN, TIBC, IRON, RETICCTPCT in the last 72 hours.  Urine analysis:    Component Value Date/Time   COLORURINE AMBER* 10/24/2015 1615   APPEARANCEUR TURBID* 10/24/2015 1615   LABSPEC 1.018 10/24/2015 1615   PHURINE 6.5 10/24/2015 1615   GLUCOSEU NEGATIVE 10/24/2015 1615   HGBUR NEGATIVE 10/24/2015 1615   [place] 10/24/2015 1615   KETONESUR NEGATIVE 10/24/2015 1615   PROTEINUR NEGATIVE 10/24/2015 1615   NITRITE POSITIVE* 10/24/2015 1615   LEUKOCYTESUR MODERATE* 10/24/2015 1615    Sepsis Labs: Lactic Acid, Venous No results found for: LATICACIDVEN  MICROBIOLOGY: No results found for this or any previous visit (from the past 240 hour(s)).  RADIOLOGY STUDIES/RESULTS: US Abdomen Complete  10/24/2015  CLINICAL DATA:  Right upper quadrant pain for 1 day EXAM: ABDOMEN ULTRASOUND COMPLETE COMPARISON:  None. FINDINGS: Gallbladder: No cholelithiasis. Distended gallbladder. Trace amount of pericholecystic fluid. Normal gallbladder wall thickness measuring 1.5 mm. Common bile duct: Diameter: Dilated common bile duct measuring 16.4 mm with an 8 mm echogenic focus within the common bile duct concerning for choledocholithiasis. Liver: No focal lesion identified. Increased hepatic parenchymal echogenicity. IVC: No abnormality visualized. Pancreas: Visualized portion unremarkable. Spleen: Size and appearance within normal  limits. Right Kidney: Length: 10.4 cm. Mild renal cortical thinning. Echogenicity within normal limits. No mass or hydronephrosis visualized. Left Kidney: Length: 10.9 cm. Echogenicity within normal limits. 1.9 x 1.1 x 2.4 cm and anechoic left renal mass most consistent with a cyst. No hydronephrosis visualized. Abdominal aorta: No aneurysm visualized. Other findings: None. IMPRESSION: 1. Dilated common bile duct measuring 16.4 mm with an 8 mm echogenic focus within the common bile duct concerning for choledocholithiasis. 2. Distended gallbladder with a trace amount of pericholecystic fluid. No cholelithiasis. Electronically Signed   By: Kathreen Devoid   On: 10/24/2015 15:53   Dg Ercp Biliary & Pancreatic Ducts  10/25/2015  CLINICAL DATA:  70 year old female with a history of known common biliary duct stones. EXAM: ERCP TECHNIQUE: Multiple spot images obtained with the fluoroscopic device and submitted for interpretation post-procedure. FLUOROSCOPY TIME:  Fluoroscopy Time:  2 minutes 23 seconds Number of Acquired Images: COMPARISON:  Ultrasound 10/24/2015 FINDINGS: Limited intraoperative fluoroscopic spot images during ERCP.  Initial images demonstrate endoscope projecting over the upper abdomen with cannulation of the ampulla. Incomplete opacification of the extrahepatic biliary system. Cine clip demonstrates balloon basket within the distal common bile duct. Final image demonstrates no large filling defect of the common bile duct. IMPRESSION: Limited intraoperative images during ERCP with balloon basket present within the common bile duct. Please refer to the dictated operative report for full details of intraoperative findings and procedure. Signed, Dulcy Fanny. Earleen Newport, DO [facility] Radiology Electronically Signed   By: Corrie Mckusick D.O.   On: 10/25/2015 14:03     LOS: 2 days   Oren Binet, MD  [facility] Pager:[phone]  If 7PM-7AM, please  contact night-coverage [email or web address] Password [facility] 10/26/2015, 10:28 AM

## 2015-10-26 NOTE — Op Note (Signed)
10/26/2015  8:58 PM  PATIENT:  Kelly [name]  70 y.o. female  Patient Care Team: Katherina Mires, MD as PCP - General (Family Medicine) Michael Boston, MD as Consulting Physician (General Surgery) Milus Banister, MD as Consulting Physician (Gastroenterology) Louretta Shorten, MD as Consulting Physician (Obstetrics and Gynecology)  PRE-OPERATIVE DIAGNOSIS:  Acute gallstone pancreatitis  POST-OPERATIVE DIAGNOSIS:  Acute gallstone pancreatitis  PROCEDURE:   LAPAROSCOPIC CHOLECYSTECTOMY WITH INTRAOPERATIVE CHOLANGIOGRAM  SURGEON:  Surgeon(s): Michael Boston, MD  ASSISTANT: RN   ANESTHESIA:   local and general  EBL:  Total I/O In: 1000 [I.V.:1000] Out: 65 [Blood:50]  Delay start of Pharmacological VTE agent (>24hrs) due to surgical blood loss or risk of bleeding:  no  DRAINS: none   SPECIMEN:  Source of Specimen:  Gallbladder  DISPOSITION OF SPECIMEN:  PATHOLOGY  COUNTS:  YES  PLAN OF CARE: Admit for overnight observation  PATIENT DISPOSITION:  PACU - hemodynamically stable.  INDICATION: Patient with gallstone pancreatitis.  Underwent ERCP with evacuation of stones with some improvement.  Felt she would benefit from cholecystectomy.  She agreed.  The anatomy & physiology of hepatobiliary & pancreatic function was discussed.  The pathophysiology of gallbladder dysfunction was discussed.  Natural history risks without surgery was discussed.   I feel the risks of no intervention will lead to serious problems that outweigh the operative risks; therefore, I recommended cholecystectomy to remove the pathology.  I explained laparoscopic techniques with possible need for an open approach.  Probable cholangiogram to evaluate the bilary tract was explained as well.    Risks such as bleeding, infection, abscess, leak, injury to other organs, need for further treatment, heart attack, death, and other risks were discussed.  I noted a good likelihood this will help address the problem.   Possibility that this will not correct all abdominal symptoms was explained.  Goals of post-operative recovery were discussed as well.  We will work to minimize complications.  An educational handout further explaining the pathology and treatment options was given as well.  Questions were answered.  The patient expresses understanding & wishes to proceed with surgery.  OR FINDINGS: Thickened and boggy edematous gallbladder consistent with acute gallstone pancreatitis with acute on chronic cholecystitis.  Very dilated biliary system and common bile duct but no evidence of any stricture or obstruction.  No choledocholithiasis.  DESCRIPTION:   The patient was identified & brought into the operating room. The patient was positioned supine with arms tucked. SCDs were active during the entire case. The patient underwent general anesthesia without any difficulty.  The abdomen was prepped and draped in a sterile fashion. A Surgical Timeout confirmed our plan.  We positioned the patient in reverse [place] & right side up.   I placed a 16mm laparoscopic port through the abdominal wall using optical entry technique in the right upper quadrant.  Entry was clean.  We induced carbon dioxide insufflation. Camera inspection revealed no injury.  There were no adhesions to the anterior abdominal wall supraumbilically.  I proceeded to continue with laparoscopic technique. I placed a #5 port in supraumbilical region, another 94mm port in the right flank near the anterior axillary line, and a 32mm port in the left subxiphoid region obliquely within the falciform ligament.  I turned attention to the right upper quadrant.  The gallbladder fundus was elevated cephalad.  There is some moderate adhesions of omentum to the underside of the liver and gallbladder that were carefully freed off.  I mobilized the sweep of  the duodenal bulb office well carefully.  I used hook cautery to free the peritoneal coverings between the  gallbladder and the liver on the posteriolateral and anteriomedial walls.   I used careful blunt and hook dissection to help get a good critical view of the cystic artery and cystic duct. I did further dissection to free a few centimeters of the  gallbladder off the liver bed to get a good critical view of the infundibulum and cystic duct. I mobilized the cystic artery; and, after getting a good 360 view, ligated the cystic artery using clips. I skeletonized the cystic duct.  I placed a clip on the infundibulum. I did a partial cystic duct-otomy and ensured patency. I placed a 5 [place] cholangiocatheter through a puncture site at the right subcostal ridge of the abdominal wall and directed it into the cystic duct.  We ran a cholangiogram with dilute radio-opaque contrast and continuous fluoroscopy.  Contrast flowed from a side branch consistent with cystic duct cannulization. Contrast flowed up the common hepatic duct into the right and left intrahepatic chains out to secondary radicals. Contrast flowed down the common bile duct easily across the normal ampulla into the duodenum.  While the system was very dilated, there was no obstruction or stricture or leak.  Otherwise consistent with a normal cholangiogram.  I removed the cholangiocatheter. I placed clips on the cystic duct x2.  I completed cystic duct transection.  Chose a cystic duct was quite inflamed and thickened, I also did a ligation of the cystic duct stump with a 0 PDS Endoloop.  I were placed a few remaining clips so that there were four titanium clips as well on the cystic duct stump.  I freed the gallbladder from its remaining attachments to the liver. I ensured hemostasis on the gallbladder fossa of the liver and elsewhere. I inspected the rest of the abdomen & detected no injury nor bleeding elsewhere.  I removed the gallbladder.  I closed the subxiphoid fascia transversely using #1 PDS interrupted stitches using a laparoscopic suture  passer under direct visualization.. I closed the skin using 4-0 monocryl stitch.  Sterile dressings were applied. The patient was extubated & arrived in the PACU in stable condition..  I had discussed postoperative care with the patient in the holding area. I updated the patient's status to the patient's daughter.  Recommendations were made.  Questions were answered.  She expressed understanding & appreciation.    I am about to locate the patient's family and discuss operative findings and postoperative goals / instructions.  Instructions are written in the chart as well.  Adin Hector, M.D., F.A.C.S. Gastrointestinal and Minimally Invasive Surgery [facility] [address] [place], [place] [id number] [phone] Main / Paging

## 2015-10-26 NOTE — Transfer of Care (Signed)
Immediate Anesthesia Transfer of Care Note  Patient: Kelly [name]  Procedure(s) Performed: Procedure(s): LAPAROSCOPIC CHOLECYSTECTOMY WITH INTRAOPERATIVE CHOLANGIOGRAM (N/A)  Patient Location: PACU  Anesthesia Type:General  Level of Consciousness:  sedated, patient cooperative and responds to stimulation  Airway & Oxygen Therapy:Patient Spontanous Breathing and Patient connected to face mask oxgen  Post-op Assessment:  Report given to PACU RN and Post -op Vital signs reviewed and stable  Post vital signs:  Reviewed and stable  Last Vitals:  Filed Vitals:   10/26/15 0613 10/26/15 1431  BP: 108/68 138/90  Pulse: 65 61  Temp: 37 C 37 C  Resp: 16 16    Complications: No apparent anesthesia complications

## 2015-10-26 NOTE — Anesthesia Preprocedure Evaluation (Addendum)
Anesthesia Evaluation  Patient identified by MRN, date of birth, ID band Patient awake    Reviewed: Allergy & Precautions, NPO status , Patient's Chart, lab work & pertinent test results  Airway Mallampati: II   Neck ROM: Full    Dental  (+) Teeth Intact   Pulmonary neg pulmonary ROS, COPD, former smoker,  COPD not treated   breath sounds clear to auscultation       Cardiovascular negative cardio ROS   Rhythm:Regular     Neuro/Psych negative neurological ROS  negative psych ROS   GI/Hepatic Neg liver ROS, Common duct stone, biliary hepatitis Nausea and vomiting     Endo/Other  negative endocrine ROS  Renal/GU negative Renal ROS  negative genitourinary   Musculoskeletal negative musculoskeletal ROS (+)   Abdominal (+) + obese,   Peds negative pediatric ROS (+)  Hematology negative hematology ROS (+) 10.9/33   Anesthesia Other Findings   Reproductive/Obstetrics negative OB ROS                           Anesthesia Physical Anesthesia Plan  ASA: II  Anesthesia Plan: General   Post-op Pain Management:    Induction: Intravenous  Airway Management Planned: Oral ETT  Additional Equipment:   Intra-op Plan:   Post-operative Plan:   Informed Consent: I have reviewed the patients History and Physical, chart, labs and discussed the procedure including the risks, benefits and alternatives for the proposed anesthesia with the patient or authorized representative who has indicated his/her understanding and acceptance.     Plan Discussed with:   Anesthesia Plan Comments:         Anesthesia Quick Evaluation

## 2015-10-26 NOTE — Progress Notes (Signed)
[facility] Progress Note  Subjective:  Just a little sore today.  Ate food yesterday and that caused some pain but otherwise doing well and anxious to have her surgery.  Objective:  Vital signs in last 24 hours: Temp:  [97.5 F (36.4 C)-98.8 F (37.1 C)] 98.6 F (37 C) (05/26 [id number]) Pulse Rate:  [50-69] 65 (05/26 0613) Resp:  [10-21] 16 (05/26 0613) BP: (108-125)/(50-71) 108/68 mmHg (05/26 0613) SpO2:  [88 %-95 %] 93 % (05/26 0613) Weight:  [170 lb (77.111 kg)] 170 lb (77.111 kg) (05/25 1017) Last BM Date: 10/25/15 General:  Alert, Well-developed, in NAD Abdomen:  Soft, non-distended. Normal bowel sounds.  Minimal epigastric TTP. Neurologic:  Alert and oriented x 4;  grossly normal neurologically. Psych:  Alert and cooperative. Normal mood and affect.  Intake/Output from previous day: 05/25 0701 - 05/26 0700 In: 2181 [P.O.:480; I.V.:1588.5; IV Piggyback:112.5] Out: 1300 [Urine:1300]  Lab Results:  Recent Labs  10/24/15 1359 10/25/15 0417 10/26/15 0421  WBC 14.3* 11.1* 8.0  HGB 12.9 11.8* 10.9*  HCT 38.7 35.9* 33.2*  PLT 274 246 190   BMET  Recent Labs  10/24/15 1359 10/25/15 0417 10/26/15 0421  NA 140 141 139  K 4.0 4.1 3.8  CL 108 111 111  CO2 26 25 24   GLUCOSE 151* 129* 119*  BUN 16 17 19   CREATININE 1.07* 1.01* 0.83  CALCIUM 8.8* 7.9* 7.7*   LFT  Recent Labs  10/26/15 0421  PROT 5.4*  ALBUMIN 2.8*  AST 76*  ALT 138*  ALKPHOS 77  BILITOT 1.3*   PT/INR  Recent Labs  10/25/15 0417  LABPROT 14.8  INR 1.14   US Abdomen Complete  10/24/2015  CLINICAL DATA:  Right upper quadrant pain for 1 day EXAM: ABDOMEN ULTRASOUND COMPLETE COMPARISON:  None. FINDINGS: Gallbladder: No cholelithiasis. Distended gallbladder. Trace amount of pericholecystic fluid. Normal gallbladder wall thickness measuring 1.5 mm. Common bile duct: Diameter: Dilated common bile duct measuring 16.4 mm with an 8 mm echogenic focus within the common bile duct  concerning for choledocholithiasis. Liver: No focal lesion identified. Increased hepatic parenchymal echogenicity. IVC: No abnormality visualized. Pancreas: Visualized portion unremarkable. Spleen: Size and appearance within normal limits. Right Kidney: Length: 10.4 cm. Mild renal cortical thinning. Echogenicity within normal limits. No mass or hydronephrosis visualized. Left Kidney: Length: 10.9 cm. Echogenicity within normal limits. 1.9 x 1.1 x 2.4 cm and anechoic left renal mass most consistent with a cyst. No hydronephrosis visualized. Abdominal aorta: No aneurysm visualized. Other findings: None. IMPRESSION: 1. Dilated common bile duct measuring 16.4 mm with an 8 mm echogenic focus within the common bile duct concerning for choledocholithiasis. 2. Distended gallbladder with a trace amount of pericholecystic fluid. No cholelithiasis. Electronically Signed   By: Kathreen Devoid   On: 10/24/2015 15:53   Dg Ercp Biliary & Pancreatic Ducts  10/25/2015  CLINICAL DATA:  70 year old female with a history of known common biliary duct stones. EXAM: ERCP TECHNIQUE: Multiple spot images obtained with the fluoroscopic device and submitted for interpretation post-procedure. FLUOROSCOPY TIME:  Fluoroscopy Time:  2 minutes 23 seconds Number of Acquired Images: COMPARISON:  Ultrasound 10/24/2015 FINDINGS: Limited intraoperative fluoroscopic spot images during ERCP. Initial images demonstrate endoscope projecting over the upper abdomen with cannulation of the ampulla. Incomplete opacification of the extrahepatic biliary system. Cine clip demonstrates balloon basket within the distal common bile duct. Final image demonstrates no large filling defect of the common bile duct. IMPRESSION: Limited intraoperative images during ERCP with balloon  basket present within the common bile duct. Please refer to the dictated operative report for full details of intraoperative findings and procedure. Signed, Dulcy Fanny. Earleen Newport, DO [facility] Electronically Signed   By: Corrie Mckusick D.O.   On: 10/25/2015 14:03   Assessment / Plan: -70 year old female with CBD stones and (mild) biliary pancreatitis.  S/p ERCP with sphincterotomy and balloon sweep/removal of stones 5/25 with Dr. Ardis Hughs.  Lipase normal this AM.  For lap chole per surgery. -Leukocytosis: Secondary to above, resolved.   LOS: 2 days   ZEHR, JESSICA D.  10/26/2015, 8:47 AM  Pager number [phone]  ________________________________________________________________________  Velora Heckler GI MD note:  I personally examined the patient, reviewed the data and agree with the assessment and plan described above.  ERCP yesterday, see report in chart. CBD stones removed, mild stenosis at distal CBD that is probably from papillary edema related to stone passage/mild pancreatitis.  [place] for cholecystectomy, timing per surgery.    Please call or page with any further questions or concerns.    Owens Loffler, MD [facility] Pager [phone]

## 2015-10-26 NOTE — Anesthesia Procedure Notes (Signed)
Procedure Name: Intubation Date/Time: 10/26/2015 8:09 PM Performed by: Freddie Breech Pre-anesthesia Checklist: Patient identified, Emergency Drugs available, Suction available, Patient being monitored and Timeout performed Patient Re-evaluated:Patient Re-evaluated prior to inductionOxygen Delivery Method: Circle system utilized Preoxygenation: Pre-oxygenation with 100% oxygen Intubation Type: IV induction Ventilation: Mask ventilation without difficulty and Oral airway inserted - appropriate to patient size Laryngoscope Size: Mac and 3 Grade View: Grade I Number of attempts: 1 Airway Equipment and Method: Patient positioned with wedge pillow and Stylet Placement Confirmation: ETT inserted through vocal cords under direct vision,  breath sounds checked- equal and bilateral,  positive ETCO2 and CO2 detector Secured at: 21 cm Tube secured with: Tape Dental Injury: Teeth and Oropharynx as per pre-operative assessment

## 2015-10-26 NOTE — Anesthesia Postprocedure Evaluation (Signed)
Anesthesia Post Note  Patient: AMYAH JOSWICK  Procedure(s) Performed: Procedure(s) (LRB): LAPAROSCOPIC CHOLECYSTECTOMY WITH INTRAOPERATIVE CHOLANGIOGRAM (N/A)  Patient location during evaluation: PACU Anesthesia Type: General Level of consciousness: awake and alert Pain management: pain level controlled Vital Signs Assessment: post-procedure vital signs reviewed and stable Respiratory status: spontaneous breathing, nonlabored ventilation, respiratory function stable and patient connected to nasal cannula oxygen Cardiovascular status: blood pressure returned to baseline and stable Postop Assessment: no signs of nausea or vomiting Anesthetic complications: no    Last Vitals:  Filed Vitals:   10/26/15 2104 10/26/15 2115  BP: 143/71 145/74  Pulse: 69 67  Temp: 36.3 C   Resp: 13 19    Last Pain:  Filed Vitals:   10/26/15 2117  PainSc: 0-No pain                 Alexis Frock

## 2015-10-27 DIAGNOSIS — K851 Biliary acute pancreatitis without necrosis or infection: Principal | ICD-10-CM

## 2015-10-27 LAB — CBC
HCT: 35.9 % — ABNORMAL LOW (ref 36.0–46.0)
Hemoglobin: 11.8 g/dL — ABNORMAL LOW (ref 12.0–15.0)
MCH: 30.1 pg (ref 26.0–34.0)
MCHC: 32.9 g/dL (ref 30.0–36.0)
MCV: 91.6 fL (ref 78.0–100.0)
Platelets: 225 10*3/uL (ref 150–400)
RBC: 3.92 MIL/uL (ref 3.87–5.11)
RDW: 12.6 % (ref 11.5–15.5)
WBC: 8.6 10*3/uL (ref 4.0–10.5)

## 2015-10-27 LAB — COMPREHENSIVE METABOLIC PANEL
ALT: 123 U/L — ABNORMAL HIGH (ref 14–54)
AST: 79 U/L — ABNORMAL HIGH (ref 15–41)
Albumin: 3 g/dL — ABNORMAL LOW (ref 3.5–5.0)
Alkaline Phosphatase: 94 U/L (ref 38–126)
Anion gap: 5 (ref 5–15)
BUN: 16 mg/dL (ref 6–20)
CO2: 25 mmol/L (ref 22–32)
Calcium: 8 mg/dL — ABNORMAL LOW (ref 8.9–10.3)
Chloride: 106 mmol/L (ref 101–111)
Creatinine, Ser: 0.89 mg/dL (ref 0.44–1.00)
GFR calc Af Amer: >60 mL/min (ref >60)
GFR calc non Af Amer: >60 mL/min (ref >60)
Glucose, Bld: 164 mg/dL — ABNORMAL HIGH (ref 65–99)
Potassium: 4.4 mmol/L (ref 3.5–5.1)
Sodium: 136 mmol/L (ref 135–145)
Total Bilirubin: 0.6 mg/dL (ref 0.3–1.2)
Total Protein: 5.9 g/dL — ABNORMAL LOW (ref 6.5–8.1)

## 2015-10-27 MED ORDER — OXYCODONE HCL 5 MG PO TABS: 5.0000 mg | ORAL_TABLET | Freq: Four times a day (QID) | ORAL | Status: DC | PRN

## 2015-10-27 NOTE — Care Management Important Message (Signed)
Important Message  Patient Details  Name: Kelly [name] MRN: [id number] Date of Birth: 06-04-1945   Medicare Important Message Given:  Yes    Erenest Rasher, RN 10/27/2015, 9:41 AM

## 2015-10-27 NOTE — Discharge Instructions (Signed)
Follow with Primary MD Suzanna Obey, MD in 7 days , kindly review discharge postop instructions provided by surgery as well.  Get CBC, CMP, 2 view Chest X ray checked  by Primary MD next visit.    Activity: As tolerated with Full fall precautions use walker/cane & assistance as needed   Disposition Home     Diet:  Low-fat Heart Healthy .  For Heart failure patients - Check your Weight same time everyday, if you gain over 2 pounds, or you develop in leg swelling, experience more shortness of breath or chest pain, call your Primary MD immediately. Follow Cardiac Low Salt Diet and 1.5 lit/day fluid restriction.   On your next visit with your primary care physician please Get Medicines reviewed and adjusted.   Please request your Prim.MD to go over all Hospital Tests and Procedure/Radiological results at the follow up, please get all Hospital records sent to your Prim MD by signing hospital release before you go home.   If you experience worsening of your admission symptoms, develop shortness of breath, life threatening emergency, suicidal or homicidal thoughts you must seek medical attention immediately by calling 911 or calling your MD immediately  if symptoms less severe.  You Must read complete instructions/literature along with all the possible adverse reactions/side effects for all the Medicines you take and that have been prescribed to you. Take any new Medicines after you have completely understood and accpet all the possible adverse reactions/side effects.   Do not drive, operate heavy machinery, perform activities at heights, swimming or participation in water activities or provide baby sitting services if your were admitted for syncope or siezures until you have seen by Primary MD or a Neurologist and advised to do so again.  Do not drive when taking Pain medications.    Do not take more than prescribed Pain, Sleep and Anxiety Medications  Special Instructions: If you have  smoked or chewed Tobacco  in the last 2 yrs please stop smoking, stop any regular Alcohol  and or any Recreational drug use.  Wear Seat belts while driving.   Please note  You were cared for by a hospitalist during your hospital stay. If you have any questions about your discharge medications or the care you received while you were in the hospital after you are discharged, you can call the unit and asked to speak with the hospitalist on call if the hospitalist that took care of you is not available. Once you are discharged, your primary care physician will handle any further medical issues. Please note that NO REFILLS for any discharge medications will be authorized once you are discharged, as it is imperative that you return to your primary care physician (or establish a relationship with a primary care physician if you do not have one) for your aftercare needs so that they can reassess your need for medications and monitor your lab values.    LAPAROSCOPIC SURGERY: POST OP INSTRUCTIONS  1. DIET: Follow a light bland diet the first 24 hours after arrival home, such as soup, liquids, crackers, etc.  Be sure to include lots of fluids daily.  Avoid fast food or heavy meals as your are more likely to get nauseated.  Eat a low fat the next few days after surgery.   2. Take your usually prescribed home medications unless otherwise directed. 3. PAIN CONTROL: a. Pain is best controlled by a usual combination of three different methods TOGETHER: i. Ice/Heat ii. Over the counter pain medication iii.  Prescription pain medication b. Most patients will experience some swelling and bruising around the incisions.  Ice packs or heating pads (30-60 minutes up to 6 times a day) will help. Use ice for the first few days to help decrease swelling and bruising, then switch to heat to help relax tight/sore spots and speed recovery.  Some people prefer to use ice alone, heat alone, alternating between ice & heat.   Experiment to what works for you.  Swelling and bruising can take several weeks to resolve.   c. It is helpful to take an over-the-counter pain medication regularly for the first few weeks.  Choose one of the following that works best for you: i. Naproxen (Aleve, etc)  Two 220mg  tabs twice a day ii. Ibuprofen (Advil, etc) Three 200mg  tabs four times a day (every meal & bedtime) iii. Acetaminophen (Tylenol, etc) 500-650mg  four times a day (every meal & bedtime) d. A  prescription for pain medication (such as oxycodone, hydrocodone, etc) should be given to you upon discharge.  Take your pain medication as prescribed.  i. If you are having problems/concerns with the prescription medicine (does not control pain, nausea, vomiting, rash, itching, etc), please call us [phone] to see if we need to switch you to a different pain medicine that will work better for you and/or control your side effect better. ii. If you need a refill on your pain medication, please contact your pharmacy.  They will contact our office to request authorization. Prescriptions will not be filled after 5 pm or on week-ends. 4. Avoid getting constipated.  Between the surgery and the pain medications, it is common to experience some constipation.  Increasing fluid intake and taking a fiber supplement (such as Metamucil, Citrucel, FiberCon, MiraLax, etc) 1-2 times a day regularly will usually help prevent this problem from occurring.  A mild laxative (prune juice, Milk of Magnesia, MiraLax, etc) should be taken according to package directions if there are no bowel movements after 48 hours.   5. Watch out for diarrhea.  If you have many loose bowel movements, simplify your diet to bland foods & liquids for a few days.  Stop any stool softeners and decrease your fiber supplement.  Switching to mild anti-diarrheal medications (Kayopectate, Pepto Bismol) can help.  If this worsens or does not improve, please call us. 6. Wash / shower every  day.  You may shower over the dressings as they are waterproof.  Continue to shower over incision(s) after the dressing is off. 7. Remove your waterproof bandages 5 days after surgery.  You may leave the incision open to air.  You may replace a dressing/Band-Aid to cover the incision for comfort if you wish.  8. ACTIVITIES as tolerated:   a. You may resume regular (light) daily activities beginning the next day--such as daily self-care, walking, climbing stairs--gradually increasing activities as tolerated.  If you can walk 30 minutes without difficulty, it is safe to try more intense activity such as jogging, treadmill, bicycling, low-impact aerobics, swimming, etc. b. Save the most intensive and strenuous activity for last such as sit-ups, heavy lifting, contact sports, etc  Refrain from any heavy lifting or straining until you are off narcotics for pain control.   c. DO NOT PUSH THROUGH PAIN.  Let pain be your guide: If it hurts to do something, don't do it.  Pain is your body warning you to avoid that activity for another week until the pain goes down. d. You may drive when you  are no longer taking prescription pain medication, you can comfortably wear a seatbelt, and you can safely maneuver your car and apply brakes. e. Dennis Bast may have sexual intercourse when it is comfortable.  9. FOLLOW UP in our office a. Please call [facility] at [phone] to set up an appointment to see your surgeon in the office for a follow-up appointment approximately 2-3 weeks after your surgery. b. Make sure that you call for this appointment the day you arrive home to insure a convenient appointment time. 10. IF YOU HAVE DISABILITY OR FAMILY LEAVE FORMS, BRING THEM TO THE OFFICE FOR PROCESSING.  DO NOT GIVE THEM TO YOUR DOCTOR.   WHEN TO CALL us [phone]: 1. Poor pain control 2. Reactions / problems with new medications (rash/itching, nausea, etc)  3. Fever over 101.5 F (38.5 C) 4. Inability to urinate 5. Nausea  and/or vomiting 6. Worsening swelling or bruising 7. Continued bleeding from incision. 8. Increased pain, redness, or drainage from the incision   The clinic staff is available to answer your questions during regular business hours (8:30am-5pm).  Please dont hesitate to call and ask to speak to one of our nurses for clinical concerns.   If you have a medical emergency, go to the nearest emergency room or call 911.  A surgeon from [facility] is always on call at the [facility], [place], [place], [place], [place]  [address] ? MAIN: [phone] ? TOLL FREE: [phone] ?  FAX [phone] [email or web address]   GETTING TO GOOD BOWEL HEALTH. Irregular bowel habits such as constipation and diarrhea can lead to many problems over time.  Having one soft bowel movement a day is the most important way to prevent further problems.  The anorectal canal is designed to handle stretching and feces to safely manage our ability to get rid of solid waste (feces, poop, stool) out of our body.  BUT, hard constipated stools can act like ripping concrete bricks and diarrhea can be a burning fire to this very sensitive area of our body, causing inflamed hemorrhoids, anal fissures, increasing risk is perirectal abscesses, abdominal pain/bloating, an making irritable bowel worse.      The goal: ONE SOFT BOWEL MOVEMENT A DAY!  To have soft, regular bowel movements:   Drink plenty of fluids, consider 4-6 tall glasses of water a day.    Take plenty of fiber.  Fiber is the undigested part of plant food that passes into the colon, acting s natures broom to encourage bowel motility and movement.  Fiber can absorb and hold large amounts of water. This results in a larger, bulkier stool, which is soft and easier to pass. Work gradually over several weeks up to 6 servings a day of fiber (25g a day even more if needed) in the form of: o Vegetables  -- Root (potatoes, carrots, turnips), leafy green (lettuce, salad greens, celery, spinach), or cooked high residue (cabbage, broccoli, etc) o Fruit -- Fresh (unpeeled skin & pulp), Dried (prunes, apricots, cherries, etc ),  or stewed ( applesauce)  o Whole grain breads, pasta, etc (whole wheat)  o Bran cereals   Bulking Agents -- This type of water-retaining fiber generally is easily obtained each day by one of the following:  o Psyllium bran -- The psyllium plant is remarkable because its ground seeds can retain so much water. This product is available as Metamucil, Konsyl, Effersyllium, Per Diem Fiber, or the less expensive generic  preparation in drug and health food stores. Although labeled a laxative, it really is not a laxative.  o Methylcellulose -- This is another fiber derived from wood which also retains water. It is available as Citrucel. o Polyethylene Glycol - and artificial fiber commonly called Miralax or Glycolax.  It is helpful for people with gassy or bloated feelings with regular fiber o Flax Seed - a less gassy fiber than psyllium  No reading or other relaxing activity while on the toilet. If bowel movements take longer than 5 minutes, you are too constipated  AVOID CONSTIPATION.  High fiber and water intake usually takes care of this.  Sometimes a laxative is needed to stimulate more frequent bowel movements, but   Laxatives are not a good long-term solution as it can wear the colon out.  They can help jump-start bowels if constipated, but should be relied on constantly without discussing with your doctor o Osmotics (Milk of Magnesia, Fleets phosphosoda, Magnesium citrate, MiraLax, GoLytely) are safer than  o Stimulants (Senokot, Castor Oil, Dulcolax, Ex Lax)    o Avoid taking laxatives for more than 7 days in a row.   IF SEVERELY CONSTIPATED, try a Bowel Retraining Program: o Do not use laxatives.  o Eat a diet high in roughage, such as bran cereals and leafy vegetables.   o Drink six (6) ounces of prune or apricot juice each morning.  o Eat two (2) large servings of stewed fruit each day.  o Take one (1) heaping tablespoon of a psyllium-based bulking agent twice a day. Use sugar-free sweetener when possible to avoid excessive calories.  o Eat a normal breakfast.  o Set aside 15 minutes after breakfast to sit on the toilet, but do not strain to have a bowel movement.  o If you do not have a bowel movement by the third day, use an enema and repeat the above steps.   Controlling diarrhea o Switch to liquids and simpler foods for a few days to avoid stressing your intestines further. o Avoid dairy products (especially milk & ice cream) for a short time.  The intestines often can lose the ability to digest lactose when stressed. o Avoid foods that cause gassiness or bloating.  Typical foods include beans and other legumes, cabbage, broccoli, and dairy foods.  Every person has some sensitivity to other foods, so listen to our body and avoid those foods that trigger problems for you. o Adding fiber (Citrucel, Metamucil, psyllium, Miralax) gradually can help thicken stools by absorbing excess fluid and retrain the intestines to act more normally.  Slowly increase the dose over a few weeks.  Too much fiber too soon can backfire and cause cramping & bloating. o Probiotics (such as active yogurt, Align, etc) may help repopulate the intestines and colon with normal bacteria and calm down a sensitive digestive tract.  Most studies show it to be of mild help, though, and such products can be costly. o Medicines: - Bismuth subsalicylate (ex. Kayopectate, Pepto Bismol) every 30 minutes for up to 6 doses can help control diarrhea.  Avoid if pregnant. - Loperamide (Immodium) can slow down diarrhea.  Start with two tablets (4mg  total) first and then try one tablet every 6 hours.  Avoid if you are having fevers or severe pain.  If you are not better or start feeling worse, stop all  medicines and call your doctor for advice o Call your doctor if you are getting worse or not better.  Sometimes further testing (cultures,  endoscopy, X-ray studies, bloodwork, etc) may be needed to help diagnose and treat the cause of the diarrhea.  TROUBLESHOOTING IRREGULAR BOWELS 1) Avoid extremes of bowel movements (no bad constipation/diarrhea) 2) Miralax 17gm mixed in 8oz. water or juice-daily. May use BID as needed.  3) Gas-x,Phazyme, etc. as needed for gas & bloating.  4) Soft,bland diet. No spicy,greasy,fried foods.  5) Prilosec over-the-counter as needed  6) May hold gluten/wheat products from diet to see if symptoms improve.  7)  May try probiotics (Align, Activa, etc) to help calm the bowels down 7) If symptoms become worse call back immediately.  Managing Pain  Pain after surgery or related to activity is often due to strain/injury to muscle, tendon, nerves and/or incisions.  This pain is usually short-term and will improve in a few months.   Many people find it helpful to do the following things TOGETHER to help speed the process of healing and to get back to regular activity more quickly:  1. Avoid heavy physical activity at first a. No lifting greater than 20 pounds at first, then increase to lifting as tolerated over the next few weeks b. Do not push through the pain.  Listen to your body and avoid positions and maneuvers than reproduce the pain.  Wait a few days before trying something more intense c. Walking is okay as tolerated, but go slowly and stop when getting sore.  If you can walk 30 minutes without stopping or pain, you can try more intense activity (running, jogging, aerobics, cycling, swimming, treadmill, sex, sports, weightlifting, etc ) d. Remember: If it hurts to do it, then dont do it!  2. Take Anti-inflammatory medication a. Choose ONE of the following over-the-counter medications: i.            Acetaminophen 500mg  tabs (Tylenol) 1-2 pills with every  meal and just before bedtime (avoid if you have liver problems) ii.            Naproxen 220mg  tabs (ex. Aleve) 1-2 pills twice a day (avoid if you have kidney, stomach, IBD, or bleeding problems) iii. Ibuprofen 200mg  tabs (ex. Advil, Motrin) 3-4 pills with every meal and just before bedtime (avoid if you have kidney, stomach, IBD, or bleeding problems) b. Take with food/snack around the clock for 1-2 weeks i. This helps the muscle and nerve tissues become less irritable and calm down faster  3. Use a Heating pad or Ice/Cold Pack a. 4-6 times a day b. May use warm bath/hottub  or showers  4. Try Gentle Massage and/or Stretching  a. at the area of pain many times a day b. stop if you feel pain - do not overdo it  Try these steps together to help you body heal faster and avoid making things get worse.  Doing just one of these things may not be enough.    If you are not getting better after two weeks or are noticing you are getting worse, contact our office for further advice; we may need to re-evaluate you & see what other things we can do to help.   Cholecystitis Cholecystitis is inflammation of the gallbladder. It is often called a gallbladder attack. The gallbladder is a pear-shaped organ that lies beneath the liver on the right side of the body. The gallbladder stores bile, which is a fluid that helps the body to digest fats. If bile builds up in your gallbladder, your gallbladder becomes inflamed. This condition may occur suddenly (be acute). Repeat episodes of acute  cholecystitis or prolonged episodes may lead to a long-term (chronic) condition. Cholecystitis is serious and it requires treatment.  CAUSES The most common cause of this condition is gallstones. Gallstones can block the tube (duct) that carries bile out of your gallbladder. This causes bile to build up. Other causes of this condition include:  Damage to the gallbladder due to a decrease in blood flow.  Infections in the bile  ducts.  Scars or kinks in the bile ducts.  Tumors in the liver, pancreas, or gallbladder. RISK FACTORS This condition is more likely to develop in:  People who have sickle cell disease.  People who take birth control pills or use estrogen.  People who have alcoholic liver disease.  People who have liver cirrhosis.  People who have their nutrition delivered through a vein (parenteral nutrition).  People who do not eat or drink (do fasting) for a long period of time.  People who are obese.  People who have rapid weight loss.  People who are pregnant.  People who have increased triglyceride levels.  People who have pancreatitis. SYMPTOMS Symptoms of this condition include:  Abdominal pain, especially in the upper right area of the abdomen.  Abdominal tenderness or bloating.  Nausea.  Vomiting.  Fever.  Chills.  Yellowing of the skin and the whites of the eyes (jaundice). DIAGNOSIS This condition is diagnosed with a medical history and physical exam. You may also have other tests, including:  Imaging tests, such as:  An ultrasound of the gallbladder.  A CT scan of the abdomen.  A gallbladder nuclear scan (HIDA scan). This scan allows your health care provider to see the bile moving from your liver to your gallbladder and to your small intestine.  MRI.  Blood tests, such as:  A complete blood count, because the white blood cell count may be higher than normal.  Liver function tests, because some levels may be higher than normal with certain types of gallstones. TREATMENT Treatment may include:  Fasting for a certain amount of time.  IV fluids.  Medicine to treat pain or vomiting.  Antibiotic medicine.  Surgery to remove your gallbladder (cholecystectomy). This may happen immediately or at a later time. [place] care will depend on your treatment. In general:  Take over-the-counter and prescription medicines only as told by  your health care provider.  If you were prescribed an antibiotic medicine, take it as told by your health care provider. Do not stop taking the antibiotic even if you start to feel better.  Follow instructions from your health care provider about what to eat or drink. When you are allowed to eat, avoid eating or drinking anything that triggers your symptoms.  Keep all follow-up visits as told by your health care provider. This is important. SEEK MEDICAL CARE IF:  Your pain is not controlled with medicine.  You have a fever. SEEK IMMEDIATE MEDICAL CARE IF:  Your pain moves to another part of your abdomen or to your back.  You continue to have symptoms or you develop new symptoms even with treatment.   This information is not intended to replace advice given to you by your health care provider. Make sure you discuss any questions you have with your health care provider.   Document Released: 05/19/2005 Document Revised: 02/07/2015 Document Reviewed: 08/30/2014 Elsevier Interactive Patient Education 2016 [employer]     Endoscopic Retrograde Cholangiopancreatography (ERCP) Endoscopic retrograde cholangiopancreatography (ERCP) is a procedure used to diagnosis many diseases of the pancreas,  bile ducts, liver, and gallbladder. During ERCP a thin, lighted tube (endoscope) is passed through the mouth and down the back of the throat into the first part of the small intestine (duodenum). A small, plastic tube (cannula) is then passed through the endoscope and directed into the bile duct or pancreatic duct. Dye is then injected through the cannula and X-rays are taken to study the biliary and pancreatic passageways.  LET [facility] CARE PROVIDER KNOW ABOUT:   Any allergies you have.   All medicines you are taking, including vitamins, herbs, eyedrops, creams, and over-the-counter medicines.   Previous problems you or members of your family have had with the use of anesthetics.   Any  blood disorders you have.   Previous surgeries you have had.   Medical conditions you have. RISKS AND COMPLICATIONS Generally, ERCP is a safe procedure. However, as with any procedure, complications can occur. A simple removal of gallstones has the lowest rate of complications. Higher rates of complication occur in people who have poorly functioning bile or pancreatic ducts. Possible complications include:   Pancreatitis.  Bleeding.  Accidental punctures in the bowel wall, pancreas, or gall bladder.  Gall bladder or bile duct infection. BEFORE THE PROCEDURE   Do not eat or drink anything, including water, for at least 8 hours before the procedure or as directed by your health care provider.   Ask your health care provider whether you should stop taking certain medicines prior to your procedure.   Arrange for someone to drive you home. You will not be allowed to drive for [id number] hours after the procedure. PROCEDURE   You will be given medicine through a vein (intravenously) to make you relaxed and sleepy.   You might have a breathing tube placed to give you medicine that makes you sleep (general anesthetic).   Your throat may be sprayed with medicine that numbs the area and prevents gagging (local anesthetic), or you may gargle this medicine.   You will lie on your left side.   The endoscope will be inserted through your mouth and into the duodenum. The tube will not interfere with your breathing. Gagging is prevented by the anesthesia.   While X-rays are being taken, you may be positioned on your stomach.   A small sample of tissue (biopsy) may be removed for examination. AFTER THE PROCEDURE   You will rest in bed until you are fully conscious.   When you first wake up, your throat may feel slightly sore.   You will not be allowed to eat or drink until numbness subsides.   Once you are able to drink, urinate, and sit on the edge of the bed without feeling sick  to your stomach (nauseous) or dizzy, you may be allowed to go home.   This information is not intended to replace advice given to you by your health care provider. Make sure you discuss any questions you have with your health care provider.   Document Released: 02/11/2001 Document Revised: 1

## 2015-10-27 NOTE — Progress Notes (Signed)
Pt accidentally pulled IV out while attempting to toilet self. Educated pt to please seek assistance with bathroom. Pt refused to have another IV inserted stating "I am fine.. I promise I will drink plenty of fluids". PRN Oxy given po and pt currently resting showing no signs of distress. Will continue to monitor.

## 2015-10-27 NOTE — Discharge Summary (Addendum)
Kelly [name], is a 70 y.o. female  DOB 11-Dec-1945  MRN [id number].  Admission date:  10/24/2015  Admitting Physician  Jonetta Osgood, MD  Discharge Date:  10/27/2015   Primary MD  Suzanna Obey, MD  Recommendations for primary care physician for things to follow:   Check CBC, CMP in a week.   Admission Diagnosis  Choledocholithiasis [K80.50] Pancreatitis due to common bile duct stone [K85.90, K80.50]   Discharge Diagnosis  Choledocholithiasis [K80.50] Pancreatitis due to common bile duct stone [K85.90, K80.50]    Principal Problem:   Acute gallstone pancreatitis s/p lap cholecystectomy 10/26/2015 Active Problems:   Choledocholithiasis   COPD (chronic obstructive pulmonary disease) ([place])      Past Medical History  Diagnosis Date  . COPD (chronic obstructive pulmonary disease) [facility])     Past Surgical History  Procedure Laterality Date  . Abdominal hysterectomy  06/2010  . Tonsillectomy    . Bladder tack    . Dilation and curettage of uterus  06/2010  . Hernia repair      inguinal hernia repair - at 70 years old       HPI  from the history and physical done on the day of admission:    Patient is a 70 y.o. female presented with right upper quadrant abdominal pain, found to have gallstone pancreatitis with choledocholithiasis. Underwent ERCP on 5/25, for laparoscopic cholecystectomy today.     Hospital Course:     Acute gallstone pancreatitis with Choledocholithiasis:. Unerwent ERCP with sphincterotomy and stone extraction 5/25. She is post laparoscopic cholecystectomy on 5:30 11/20/2015. She has tolerated the procedure well completely symptom free and tolerating diet, will discharge home once cleared by general surgery today.  COPD: No acute issues.     Follow UP  Follow-up Information     Follow up with [facility]. Schedule an appointment as soon as possible for a visit in 3 weeks.   Specialty:  General Surgery   Why:  To follow up after your operation, To follow up after your hospital stay   Contact information:   [address] [place]      Follow up with Suzanna Obey, MD. Schedule an appointment as soon as possible for a visit in 1 week.   Specialty:  Family Medicine   Contact information:   [place] [address] [phone]        Consults obtained - GI, general surgery  Discharge Condition: Stable  Diet and Activity recommendation: See Discharge Instructions below  Discharge Instructions       Discharge Instructions    Call MD for:  extreme fatigue    Complete by:  As directed      Call MD for:  hives    Complete by:  As directed      Call MD for:  persistant nausea and vomiting    Complete by:  As directed      Call MD for:  redness, tenderness, or signs of infection (pain, swelling, redness, odor or green/yellow discharge around incision site)    Complete by:  As directed      Call MD for:  severe uncontrolled pain    Complete by:  As directed      Call MD for:    Complete by:  As directed   Temperature > 101.1F     Diet - low sodium heart healthy    Complete by:  As directed   Start with bland, low residue diet for a few days, then advance to a heart healthy (low fat, high fiber) diet.  If you feel nauseated or constipated, simplify to a liquid only diet for 48 hours until you are feeling better (no more nausea, farting/passing gas, having a bowel movement, etc...).  If you cannot tolerate even drinking liquids, or feeling worse, let your surgeon know or go to the Emergency Department for help.     Diet - low sodium heart healthy    Complete by:  As directed      Discharge instructions    Complete by:  As directed   Please see  discharge instruction sheets.   Also refer to any handouts/printouts that may have been given from the [facility] surgery office (if you visited [place] there before surgery) Please call our office if you have any questions or concerns [phone]     Discharge instructions    Complete by:  As directed   Follow with Primary MD Suzanna Obey, MD in 7 days , kindly review discharge postop instructions provided by surgery as well.  Get CBC, CMP, 2 view Chest X ray checked  by Primary MD next visit.    Activity: As tolerated with Full fall precautions use walker/cane & assistance as needed   Disposition Home     Diet:  Low-fat Heart Healthy .  For Heart failure patients - Check your Weight same time everyday, if you gain over 2 pounds, or you develop in leg swelling, experience more shortness of breath or chest pain, call your Primary MD immediately. Follow Cardiac Low Salt Diet and 1.5 lit/day fluid restriction.   On your next visit with your primary care physician please Get Medicines reviewed and adjusted.   Please request your Prim.MD to go over all Hospital Tests and Procedure/Radiological results at the follow up, please get all Hospital records sent to your Prim MD by signing hospital release before you go home.   If you experience worsening of your admission symptoms, develop shortness of breath, life threatening emergency, suicidal or homicidal thoughts you must seek medical attention immediately by calling 911 or calling your MD immediately  if symptoms less severe.  You Must read complete instructions/literature along with all the possible adverse reactions/side effects for all the Medicines you take and that have been prescribed to you. Take any new Medicines after you have completely understood and accpet all the possible adverse reactions/side effects.   Do not drive, operate heavy machinery, perform activities at heights, swimming or participation in water activities or provide baby  sitting services if your were admitted for syncope or siezures until you have seen by Primary MD or a Neurologist and advised to do so again.  Do not drive when taking Pain medications.    Do not take more than prescribed Pain, Sleep and Anxiety Medications  Special Instructions: If you have smoked or chewed Tobacco  in the last 2 yrs please stop smoking, stop any regular Alcohol  and or any Recreational drug use.  Wear Seat belts while driving.   Please note  You were cared for by a hospitalist during your hospital stay. If you have any questions about your discharge medications or the care you received while you were in the hospital after you are discharged, you can call the unit and asked to speak with the hospitalist on call if the hospitalist that took care of you is not available. Once you are discharged, your primary care physician will handle any further medical issues. Please note that NO REFILLS for any discharge medications will be authorized once you are discharged, as it is imperative that you return to your primary care physician (or establish a relationship with a primary care physician if you do not have one) for your aftercare needs so that they can reassess your need for medications and monitor your lab values.     Discharge wound care:    Complete by:  As directed   If you have closed incisions: Shower and bathe over these incisions with soap and water every day.  It is OK to wash over the dressings: they are waterproof. Remove all surgical dressings on postoperative day #3.  You do not need to replace dressings over the closed incisions unless you feel more comfortable with a Band-Aid covering it.   If you have an open wound: That requires packing, so please see wound care instructions.   In general, remove all dressings, wash wound with soap and water and then replace with saline moistened gauze.  Do the dressing change at least every day.    Please call our office  [phone] if you have further questions.     Driving Restrictions    Complete by:  As directed   No driving until off narcotics and can safely swerve away without pain during an emergency     Increase activity slowly    Complete by:  As directed   Walk an hour a day.  Use 20-30 minute walks.  When you can walk 30 minutes without difficulty, it is fine to restart low impact/moderate activities such as biking, jogging, swimming, sexual activity, etc.  Eventually you can increase to unrestricted activity when not feeling pain.  If you feel pain: STOP!Marland Kitchen   Let pain protect you from overdoing it.  Use ice/heat & over-the-counter pain medications to help minimize soreness.  If that is not enough, then use your narcotic pain prescription as needed to remain active.  It is better to take extra pain medications and be more active than to stay bedridden to avoid all pain medications.     Increase activity slowly    Complete by:  As directed      Lifting restrictions    Complete by:  As directed   Avoid heavy lifting initially, <20 pounds at first.   Do not push through pain.   You have no specific weight limit: If it hurts to do, DON'T DO IT.    If you feel no pain, you are not injuring anything.  Pain will protect you from injury.   Coughing and sneezing are far more stressful to your incision than any lifting.   Avoid resuming heavy lifting (>50 pounds) or other intense activity until off all narcotic pain medications.   When want to exercise more, give yourself 2 weeks to gradually get back to full intense exercise/activity.     May shower / Bathe    Complete by:  As directed  SHOWER EVERY DAY.  It is fine for dressings or wounds to be washed/rinsed.  Use gentle soap & water.  This will help the incisions and/or wounds get clean & minimize infection.     May walk up steps    Complete by:  As directed      Sexual Activity Restrictions    Complete by:  As directed   Sexual activity as tolerated.   Do not push through pain.  Pain will protect you from injury.     Walk with assistance    Complete by:  As directed   Walk over an hour a day.  May use a walker/cane/companion to help with balance and stamina.             Discharge Medications       Medication List    TAKE these medications        Biotin 5 MG Tabs  Take 5 mg by mouth daily.     oxyCODONE 5 MG immediate release tablet  Commonly known as:  Oxy IR/ROXICODONE  Take 1 tablet (5 mg total) by mouth every 6 (six) hours as needed for moderate pain or severe pain.        Major procedures and Radiology Reports - PLEASE review detailed and final reports for all details, in brief -   ERCP by Dr. Ardis Hughs. Noted CBD stone, status post sphincterotomy and stone removal.  Laparoscopic cholecystectomy by Gen. surgery on 10/26/2015   Dg Cholangiogram Operative  10/26/2015  CLINICAL DATA:  Intraoperative cholangiogram EXAM: INTRAOPERATIVE CHOLANGIOGRAM TECHNIQUE: Cholangiographic images from the C-arm fluoroscopic device were submitted for interpretation post-operatively. Please see the procedural report for the amount of contrast and the fluoroscopy time utilized. COMPARISON:  10/25/2015 FINDINGS: Common bile duct is well visualized as are the intrahepatic radicles. No filling defects are seen. Free flow contrast into the duodenum is seen. IMPRESSION: No retained filling defects identified. Electronically Signed   By: Inez Catalina M.D.   On: 10/26/2015 21:43   US Abdomen Complete  10/24/2015  CLINICAL DATA:  Right upper quadrant pain for 1 day EXAM: ABDOMEN ULTRASOUND COMPLETE COMPARISON:  None. FINDINGS: Gallbladder: No cholelithiasis. Distended gallbladder. Trace amount of pericholecystic fluid. Normal gallbladder wall thickness measuring 1.5 mm. Common bile duct: Diameter: Dilated common bile duct measuring 16.4 mm with an 8 mm echogenic focus within the common bile duct concerning for choledocholithiasis. Liver: No focal lesion  identified. Increased hepatic parenchymal echogenicity. IVC: No abnormality visualized. Pancreas: Visualized portion unremarkable. Spleen: Size and appearance within normal limits. Right Kidney: Length: 10.4 cm. Mild renal cortical thinning. Echogenicity within normal limits. No mass or hydronephrosis visualized. Left Kidney: Length: 10.9 cm. Echogenicity within normal limits. 1.9 x 1.1 x 2.4 cm and anechoic left renal mass most consistent with a cyst. No hydronephrosis visualized. Abdominal aorta: No aneurysm visualized. Other findings: None. IMPRESSION: 1. Dilated common bile duct measuring 16.4 mm with an 8 mm echogenic focus within the common bile duct concerning for choledocholithiasis. 2. Distended gallbladder with a trace amount of pericholecystic fluid. No cholelithiasis. Electronically Signed   By: Kathreen Devoid   On: 10/24/2015 15:53   Dg Ercp Biliary & Pancreatic Ducts  10/25/2015  CLINICAL DATA:  70 year old female with a history of known common biliary duct stones. EXAM: ERCP TECHNIQUE: Multiple spot images obtained with the fluoroscopic device and submitted for interpretation post-procedure. FLUOROSCOPY TIME:  Fluoroscopy Time:  2 minutes 23 seconds Number of Acquired Images: COMPARISON:  Ultrasound 10/24/2015 FINDINGS: Limited intraoperative  fluoroscopic spot images during ERCP. Initial images demonstrate endoscope projecting over the upper abdomen with cannulation of the ampulla. Incomplete opacification of the extrahepatic biliary system. Cine clip demonstrates balloon basket within the distal common bile duct. Final image demonstrates no large filling defect of the common bile duct. IMPRESSION: Limited intraoperative images during ERCP with balloon basket present within the common bile duct. Please refer to the dictated operative report for full details of intraoperative findings and procedure. Signed, Dulcy Fanny. Earleen Newport, DO [facility]  Electronically Signed   By: Corrie Mckusick D.O.   On: 10/25/2015 14:03    Micro Results      Recent Results (from the past 240 hour(s))  MRSA PCR Screening     Status: None   Collection Time: 10/26/15  4:22 PM  Result Value Ref Range Status   MRSA by PCR NEGATIVE NEGATIVE Final    Comment:        The GeneXpert MRSA Assay (FDA approved for NASAL specimens only), is one component of a comprehensive MRSA colonization surveillance program. It is not intended to diagnose MRSA infection nor to guide or monitor treatment for MRSA infections.        Today   Subjective    Joanne Ouk today has no headache,no chest abdominal pain,no new weakness tingling or numbness, feels much better wants to go home today.     Objective   Blood pressure 112/66, pulse 74, temperature 98.2 F (36.8 C), temperature source Oral, resp. rate 18, height 5\' 9"  (1.753 m), weight 77.111 kg (170 lb), SpO2 90 %.   Intake/Output Summary (Last 24 hours) at 10/27/15 0956 Last data filed at 10/27/15 0452  Gross per 24 hour  Intake 2914.17 ml  Output    825 ml  Net 2089.17 ml    Exam Awake Alert, Oriented x 3, No new F.N deficits, Normal affect [place].AT,PERRAL Supple Neck,No JVD, No cervical lymphadenopathy appriciated.  Symmetrical Chest wall movement, Good air movement bilaterally, CTAB RRR,No Gallops,Rubs or new Murmurs, No Parasternal Heave +ve B.Sounds, Abd Soft, Non tender, No organomegaly appriciated, No rebound -guarding or rigidity. Postop sites clean. No Cyanosis, Clubbing or edema, No new Rash or bruise   Data Review   CBC w Diff: Lab Results  Component Value Date   WBC 8.6 10/27/2015   HGB 11.8* 10/27/2015   HCT 35.9* 10/27/2015   PLT 225 10/27/2015    CMP: Lab Results  Component Value Date   NA 136 10/27/2015   K 4.4 10/27/2015   CL 106 10/27/2015   CO2 25 10/27/2015   BUN 16 10/27/2015   CREATININE 0.89 10/27/2015   PROT 5.9* 10/27/2015   ALBUMIN 3.0* 10/27/2015    BILITOT 0.6 10/27/2015   ALKPHOS 94 10/27/2015   AST 79* 10/27/2015   ALT 123* 10/27/2015  .   Total Time in preparing paper work, data evaluation and todays exam - 35 minutes  Thurnell Lose M.D on 10/27/2015 at 9:56 AM  [facility]   Office  [phone]

## 2015-10-27 NOTE — Care Management Note (Signed)
Case Management Note  Patient Details  Name: Kelly [name] MRN: [id number] Date of Birth: August 02, 1945  Subjective/Objective:      Lap Cholecystectomy               Action/Plan: Discharge Planning: AVS reviewed:  NCM spoke to pt and states dtr will assist her at home as needed. She can afford her medications. No NCM needs identified.    PCP- Suzanna Obey MD  Expected Discharge Date:  10/27/2015              Expected Discharge Plan:  Home/Self Care  In-House Referral:  NA  Discharge planning Services  CM Consult  Post Acute Care Choice:  NA Choice offered to:  NA  DME Arranged:  N/A DME Agency:  NA  HH Arranged:  NA HH Agency:  NA  Status of Service:  Completed, signed off  Medicare Important Message Given:  Yes Date Medicare IM Given:    Medicare IM give by:    Date Additional Medicare IM Given:    Additional Medicare Important Message give by:     If discussed at [place] of Stay Meetings, dates discussed:    Additional Comments:  Erenest Rasher, RN 10/27/2015, 9:42 AM

## 2015-10-27 NOTE — Progress Notes (Signed)
Patient ID: Kelly [name], female   DOB: 29-Jul-1945, 70 y.o.   MRN: [id number] [facility] Progress Note:   1 Day Post-Op  Subjective: Mental status is clear.  No complaints and ready to go home.   Objective: Vital signs in last 24 hours: Temp:  [97.4 F (36.3 C)-98.6 F (37 C)] 98.2 F (36.8 C) (05/27 0451) Pulse Rate:  [61-74] 74 (05/27 0451) Resp:  [13-19] 18 (05/27 0451) BP: (112-145)/(64-90) 112/66 mmHg (05/27 0451) SpO2:  [90 %-97 %] 90 % (05/27 0451)  Intake/Output from previous day: 05/26 0701 - 05/27 0700 In: 2914.2 [I.V.:2914.2] Out: 7829 [Urine:1000; Blood:75] Intake/Output this shift:    Physical Exam: Work of breathing is not labored.  Abdomen is appropriately sore.   Lab Results:  Results for orders placed or performed during the hospital encounter of 10/24/15 (from the past 48 hour(s))  Comprehensive metabolic panel     Status: Abnormal   Collection Time: 10/26/15  4:21 AM  Result Value Ref Range   Sodium 139 135 - 145 mmol/L   Potassium 3.8 3.5 - 5.1 mmol/L   Chloride 111 101 - 111 mmol/L   CO2 24 22 - 32 mmol/L   Glucose, Bld 119 (H) 65 - 99 mg/dL   BUN 19 6 - 20 mg/dL   Creatinine, Ser 0.83 0.44 - 1.00 mg/dL   Calcium 7.7 (L) 8.9 - 10.3 mg/dL   Total Protein 5.4 (L) 6.5 - 8.1 g/dL   Albumin 2.8 (L) 3.5 - 5.0 g/dL   AST 76 (H) 15 - 41 U/L   ALT 138 (H) 14 - 54 U/L   Alkaline Phosphatase 77 38 - 126 U/L   Total Bilirubin 1.3 (H) 0.3 - 1.2 mg/dL   GFR calc non Af Amer >60 >60 mL/min   GFR calc Af Amer >60 >60 mL/min    Comment: (NOTE) The eGFR has been calculated using the CKD EPI equation. This calculation has not been validated in all clinical situations. eGFR's persistently <60 mL/min signify possible Chronic Kidney Disease.    Anion gap 4 (L) 5 - 15  CBC     Status: Abnormal   Collection Time: 10/26/15  4:21 AM  Result Value Ref Range   WBC 8.0 4.0 - 10.5 K/uL   RBC 3.62 (L) 3.87 - 5.11 MIL/uL   Hemoglobin 10.9 (L) 12.0 - 15.0  g/dL   HCT 33.2 (L) 36.0 - 46.0 %   MCV 91.7 78.0 - 100.0 fL   MCH 30.1 26.0 - 34.0 pg   MCHC 32.8 30.0 - 36.0 g/dL   RDW 12.8 11.5 - 15.5 %   Platelets 190 150 - 400 K/uL  Lipase, blood     Status: None   Collection Time: 10/26/15  4:21 AM  Result Value Ref Range   Lipase 20 11 - 51 U/L  MRSA PCR Screening     Status: None   Collection Time: 10/26/15  4:22 PM  Result Value Ref Range   MRSA by PCR NEGATIVE NEGATIVE    Comment:        The GeneXpert MRSA Assay (FDA approved for NASAL specimens only), is one component of a comprehensive MRSA colonization surveillance program. It is not intended to diagnose MRSA infection nor to guide or monitor treatment for MRSA infections.   CBC     Status: Abnormal   Collection Time: 10/27/15  8:00 AM  Result Value Ref Range   WBC 8.6 4.0 - 10.5 K/uL   RBC 3.92 3.87 -  5.11 MIL/uL   Hemoglobin 11.8 (L) 12.0 - 15.0 g/dL   HCT 35.9 (L) 36.0 - 46.0 %   MCV 91.6 78.0 - 100.0 fL   MCH 30.1 26.0 - 34.0 pg   MCHC 32.9 30.0 - 36.0 g/dL   RDW 12.6 11.5 - 15.5 %   Platelets 225 150 - 400 K/uL  Comprehensive metabolic panel     Status: Abnormal   Collection Time: 10/27/15  8:00 AM  Result Value Ref Range   Sodium 136 135 - 145 mmol/L   Potassium 4.4 3.5 - 5.1 mmol/L   Chloride 106 101 - 111 mmol/L   CO2 25 22 - 32 mmol/L   Glucose, Bld 164 (H) 65 - 99 mg/dL   BUN 16 6 - 20 mg/dL   Creatinine, Ser 0.89 0.44 - 1.00 mg/dL   Calcium 8.0 (L) 8.9 - 10.3 mg/dL   Total Protein 5.9 (L) 6.5 - 8.1 g/dL   Albumin 3.0 (L) 3.5 - 5.0 g/dL   AST 79 (H) 15 - 41 U/L   ALT 123 (H) 14 - 54 U/L   Alkaline Phosphatase 94 38 - 126 U/L   Total Bilirubin 0.6 0.3 - 1.2 mg/dL   GFR calc non Af Amer >60 >60 mL/min   GFR calc Af Amer >60 >60 mL/min    Comment: (NOTE) The eGFR has been calculated using the CKD EPI equation. This calculation has not been validated in all clinical situations. eGFR's persistently <60 mL/min signify possible Chronic Kidney Disease.     Anion gap 5 5 - 15    Radiology/Results: Dg Cholangiogram Operative  10/26/2015  CLINICAL DATA:  Intraoperative cholangiogram EXAM: INTRAOPERATIVE CHOLANGIOGRAM TECHNIQUE: Cholangiographic images from the C-arm fluoroscopic device were submitted for interpretation post-operatively. Please see the procedural report for the amount of contrast and the fluoroscopy time utilized. COMPARISON:  10/25/2015 FINDINGS: Common bile duct is well visualized as are the intrahepatic radicles. No filling defects are seen. Free flow contrast into the duodenum is seen. IMPRESSION: No retained filling defects identified. Electronically Signed   By: Inez Catalina M.D.   On: 10/26/2015 21:43   Dg Ercp Biliary & Pancreatic Ducts  10/25/2015  CLINICAL DATA:  70 year old female with a history of known common biliary duct stones. EXAM: ERCP TECHNIQUE: Multiple spot images obtained with the fluoroscopic device and submitted for interpretation post-procedure. FLUOROSCOPY TIME:  Fluoroscopy Time:  2 minutes 23 seconds Number of Acquired Images: COMPARISON:  Ultrasound 10/24/2015 FINDINGS: Limited intraoperative fluoroscopic spot images during ERCP. Initial images demonstrate endoscope projecting over the upper abdomen with cannulation of the ampulla. Incomplete opacification of the extrahepatic biliary system. Cine clip demonstrates balloon basket within the distal common bile duct. Final image demonstrates no large filling defect of the common bile duct. IMPRESSION: Limited intraoperative images during ERCP with balloon basket present within the common bile duct. Please refer to the dictated operative report for full details of intraoperative findings and procedure. Signed, Dulcy Fanny. Earleen Newport, DO [facility] Electronically Signed   By: Corrie Mckusick D.O.   On: 10/25/2015 14:03    Anti-infectives: Anti-infectives    Start     Dose/Rate Route Frequency Ordered Stop    10/25/15 0100  [MAR Hold]  piperacillin-tazobactam (ZOSYN) IVPB 3.375 g  Status:  Discontinued     (MAR Hold since 10/26/15 1932)   3.375 g 12.5 mL/hr over 240 Minutes Intravenous Every 8 hours 10/24/15 1659 10/26/15 2110   10/24/15 1645  piperacillin-tazobactam (ZOSYN) IVPB  3.375 g     3.375 g 100 mL/hr over 30 Minutes Intravenous  Once 10/24/15 1640 10/24/15 1725      Assessment/Plan: Problem List: Patient Active Problem List   Diagnosis Date Noted  . Choledocholithiasis 10/24/2015  . Acute gallstone pancreatitis s/p lap cholecystectomy 10/26/2015 10/24/2015  . COPD (chronic obstructive pulmonary disease) ([place]) 10/24/2015    OK for discharge from surgery standpoint.   1 Day Post-Op    LOS: 3 days   Matt B. Hassell Done, MD, [facility] [phone] beeper [phone]  10/27/2015 10:31 AM

## 2015-10-27 NOTE — Progress Notes (Signed)
Assessment unchanged. Independent with ambulation and ADL's. Pt verbalized understanding of dc instructions through teach back including follow-up care and when to call the doctor. Script x 1 given as provided by MD. Discharged via foot per request accompanied by daughter and NT to front entrance.

## 2015-10-29 ENCOUNTER — Encounter (HOSPITAL_COMMUNITY): Payer: Self-pay | Admitting: Gastroenterology

## 2015-10-30 ENCOUNTER — Encounter (HOSPITAL_COMMUNITY): Payer: Self-pay | Admitting: Surgery

## 2016-05-02 IMAGING — US US ABDOMEN COMPLETE
1 series · 13 of 25 positions shown · non-contrast
Comparison: None.

CLINICAL DATA: Right upper quadrant pain for 1 day

EXAM:
ABDOMEN ULTRASOUND COMPLETE

[Series 1: us abdomen complete · 0.25mm/px · 13 of 123 slices shown]
[im 1/123]
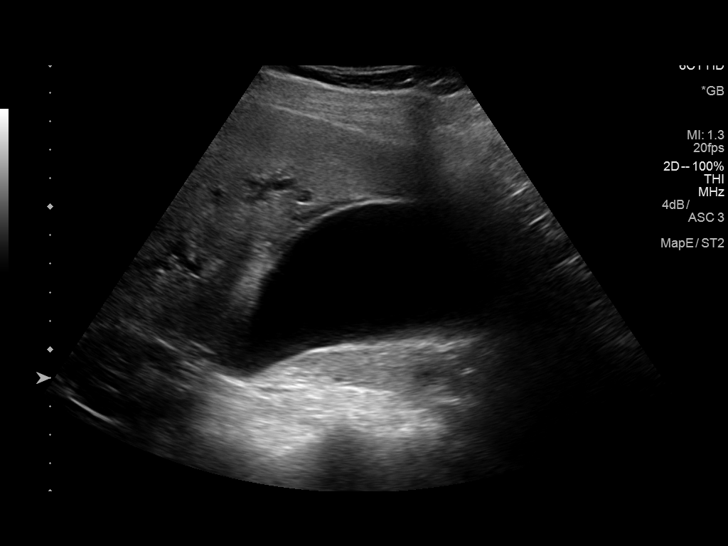
[im 11/123]
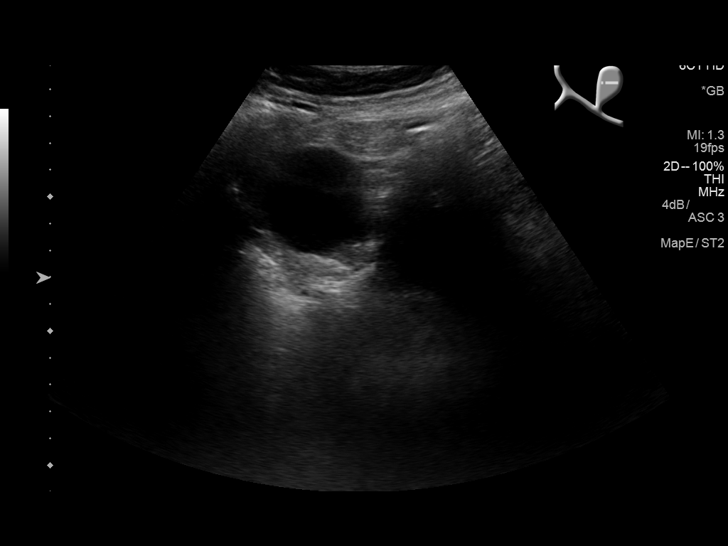
[im 21/123]
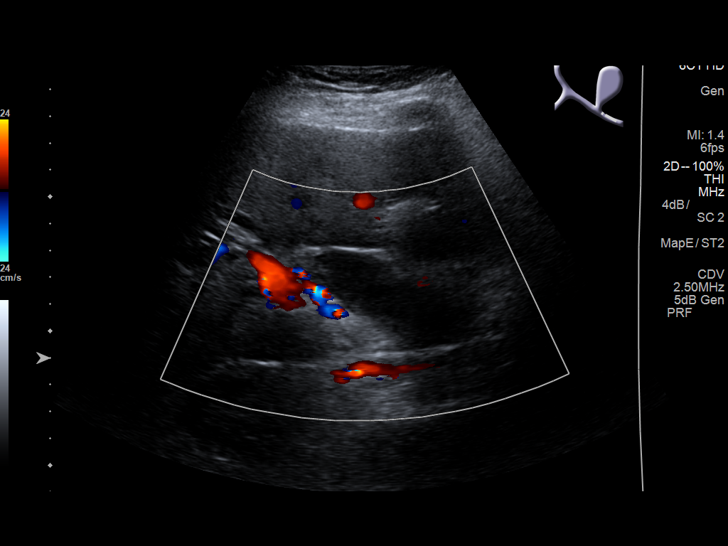
[im 31/123]
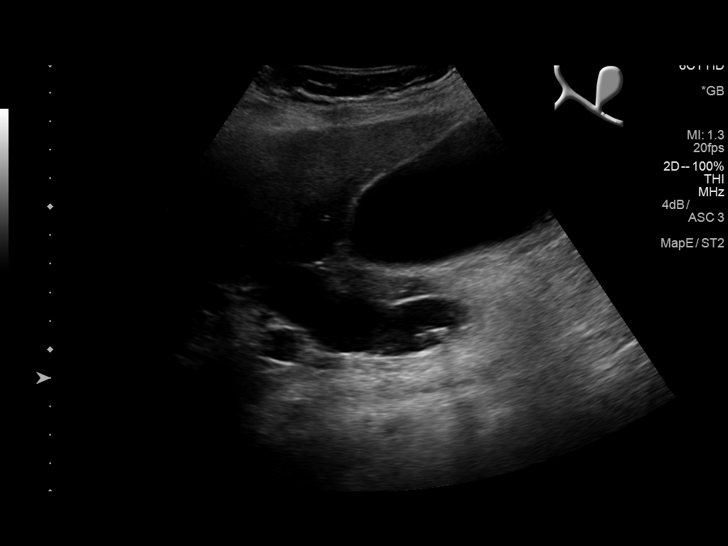
[im 41/123]
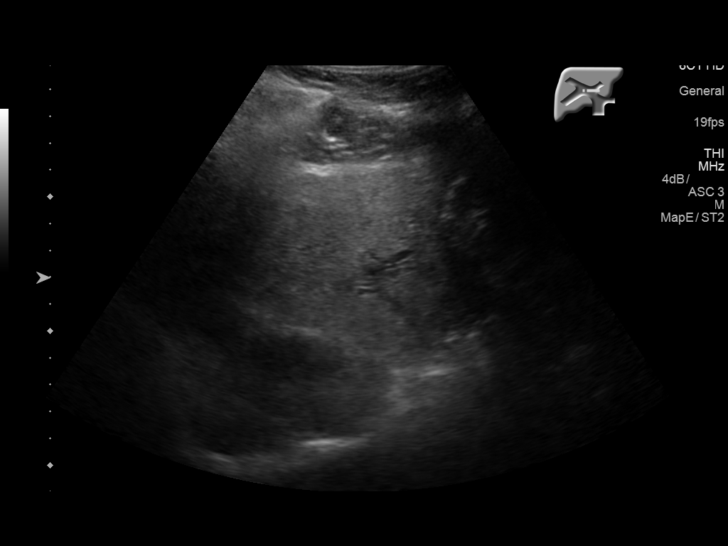
[im 51/123]
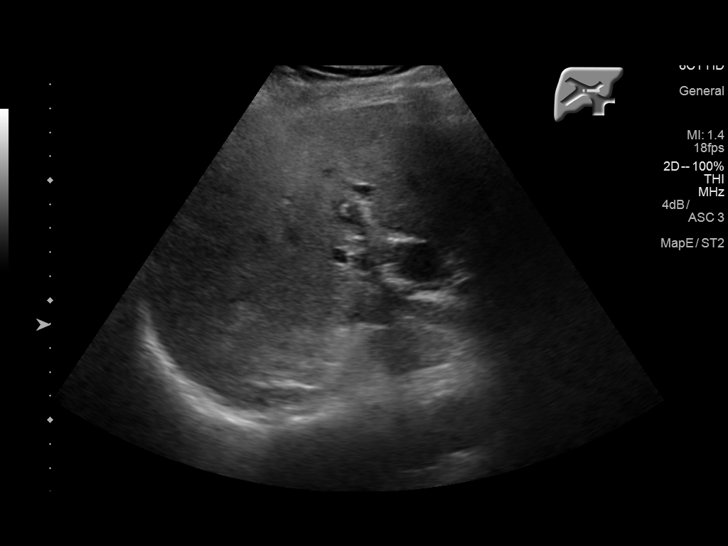
[im 62/123]
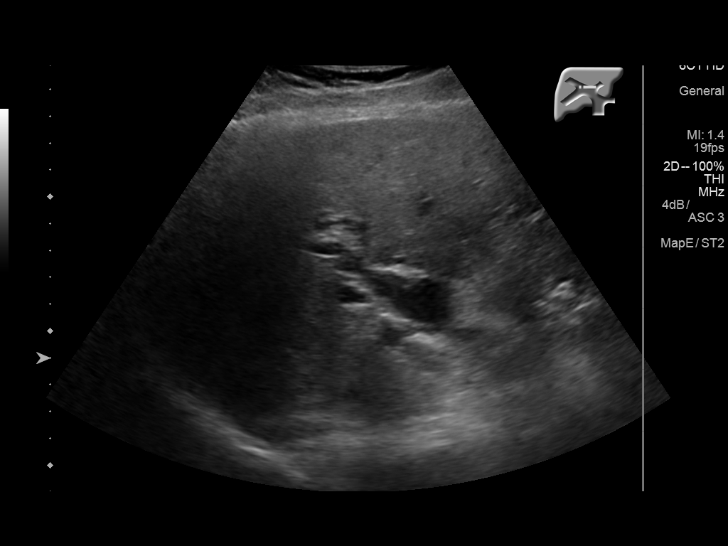
[im 72/123]
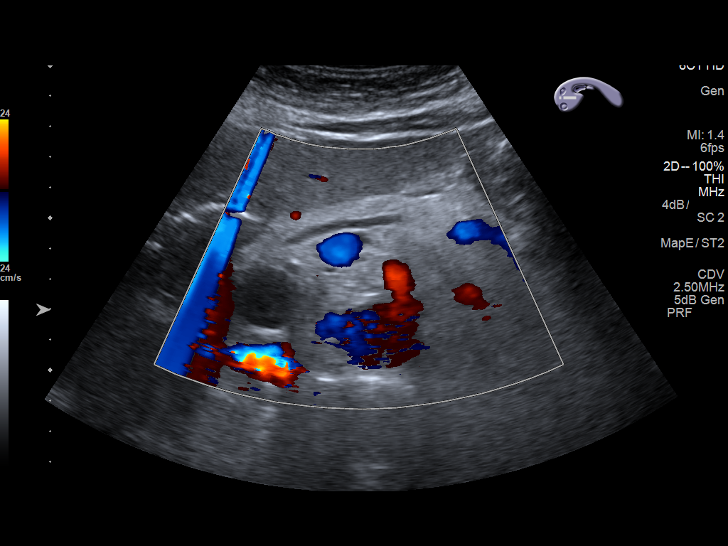
[im 82/123]
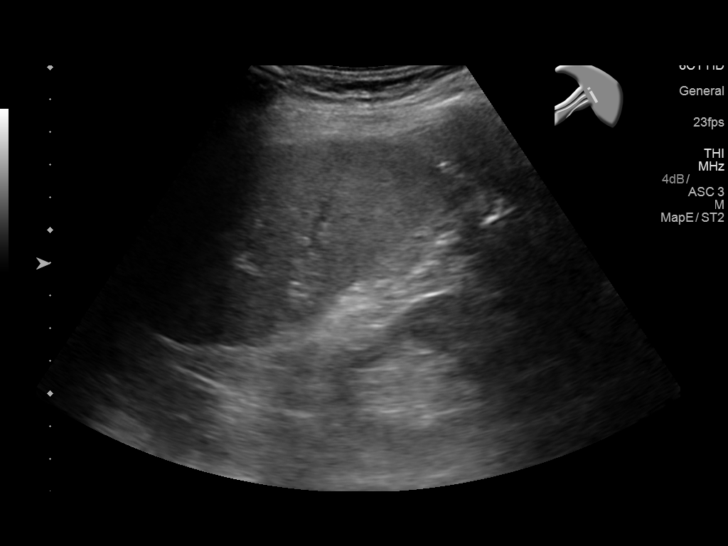
[im 92/123]
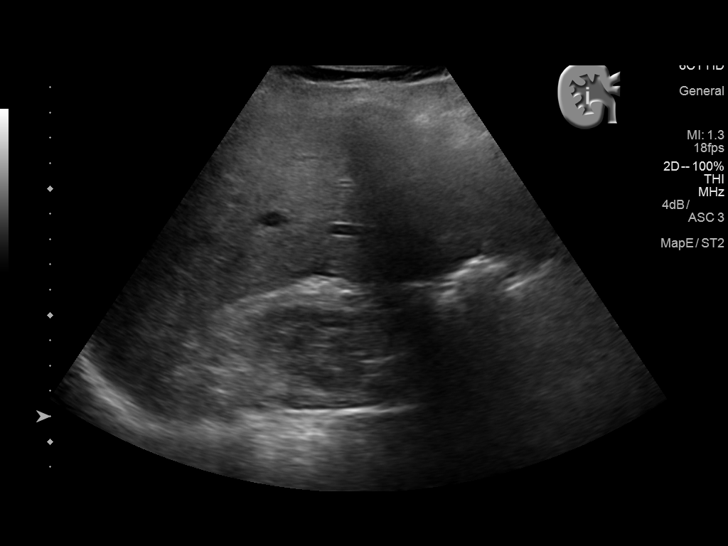
[im 102/123]
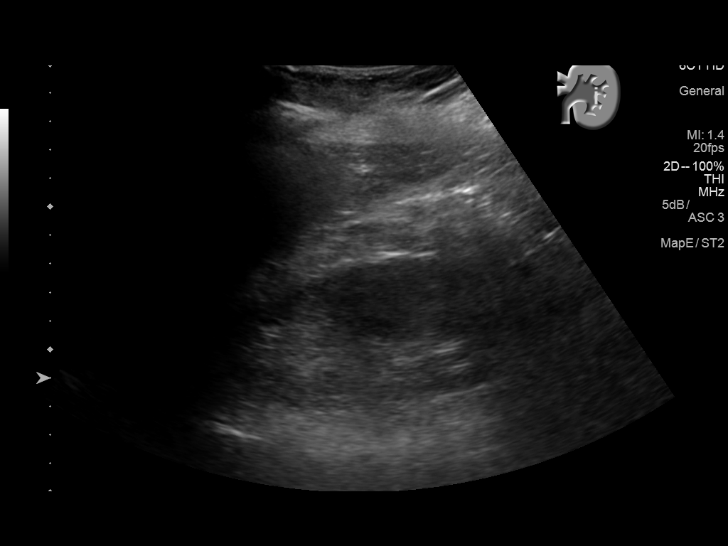
[im 112/123]
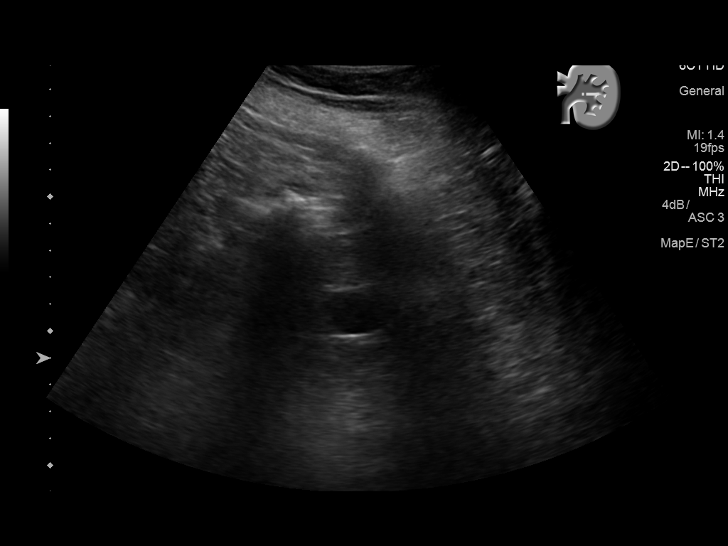
[im 123/123]
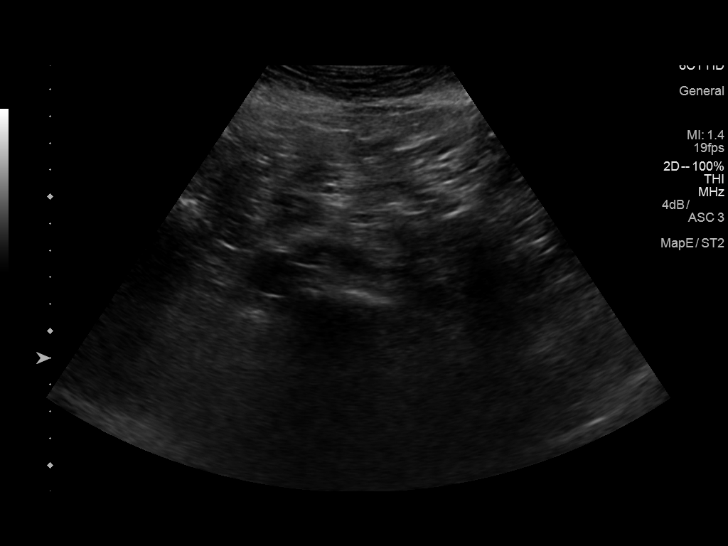

[13 of 25 positions shown; findings below may reference images not displayed]

FINDINGS: Gallbladder: No cholelithiasis. Distended gallbladder. Trace amount
of pericholecystic fluid. Normal gallbladder wall thickness
measuring 1.5 mm.

Common bile duct: Diameter: Dilated common bile duct measuring
mm with an 8 mm echogenic focus within the common bile duct
concerning for choledocholithiasis.

Liver: No focal lesion identified. Increased hepatic parenchymal
echogenicity.

IVC: No abnormality visualized.

Pancreas: Visualized portion unremarkable.

Spleen: Size and appearance within normal limits.

Right Kidney: Length: 10.4 cm. Mild renal cortical thinning.
Echogenicity within normal limits. No mass or hydronephrosis
visualized.

Left Kidney: Length: 10.9 cm. Echogenicity within normal limits.
x 1.1 x 2.4 cm and anechoic left renal mass most consistent with a
cyst. No hydronephrosis visualized.

Abdominal aorta: No aneurysm visualized.

Other findings: None.
IMPRESSION: 1. Dilated common bile duct measuring 16.4 mm with an 8 mm echogenic
focus within the common bile duct concerning for
choledocholithiasis.
2. Distended gallbladder with a trace amount of pericholecystic
fluid. No cholelithiasis.

## 2020-08-17 ENCOUNTER — Encounter (HOSPITAL_BASED_OUTPATIENT_CLINIC_OR_DEPARTMENT_OTHER): Payer: Self-pay | Admitting: *Deleted

## 2020-08-17 ENCOUNTER — Emergency Department (HOSPITAL_BASED_OUTPATIENT_CLINIC_OR_DEPARTMENT_OTHER): Payer: Medicare PPO

## 2020-08-17 ENCOUNTER — Emergency Department (HOSPITAL_BASED_OUTPATIENT_CLINIC_OR_DEPARTMENT_OTHER)
Admission: EM | Admit: 2020-08-17 | Discharge: 2020-08-17 | Disposition: A | Discharge status: Home / Self Care | Payer: Medicare PPO | Attending: Emergency Medicine | Admitting: Emergency Medicine

## 2020-08-17 ENCOUNTER — Other Ambulatory Visit: Payer: Self-pay

## 2020-08-17 DIAGNOSIS — N10 Acute tubulo-interstitial nephritis: Secondary | ICD-10-CM

## 2020-08-17 DIAGNOSIS — J449 Chronic obstructive pulmonary disease, unspecified: Secondary | ICD-10-CM | POA: Diagnosis not present

## 2020-08-17 DIAGNOSIS — Z20822 Contact with and (suspected) exposure to covid-19: Secondary | ICD-10-CM | POA: Diagnosis not present

## 2020-08-17 DIAGNOSIS — M545 Low back pain, unspecified: Secondary | ICD-10-CM | POA: Diagnosis not present

## 2020-08-17 DIAGNOSIS — N390 Urinary tract infection, site not specified: Secondary | ICD-10-CM | POA: Diagnosis not present

## 2020-08-17 DIAGNOSIS — Z87891 Personal history of nicotine dependence: Secondary | ICD-10-CM | POA: Insufficient documentation

## 2020-08-17 DIAGNOSIS — R109 Unspecified abdominal pain: Secondary | ICD-10-CM | POA: Diagnosis present

## 2020-08-17 DIAGNOSIS — Z85038 Personal history of other malignant neoplasm of large intestine: Secondary | ICD-10-CM | POA: Diagnosis not present

## 2020-08-17 HISTORY — DX: Malignant neoplasm of colon, unspecified: C18.9

## 2020-08-17 LAB — CBC WITH DIFFERENTIAL/PLATELET
Abs Immature Granulocytes: 0.04 10*3/uL (ref 0.00–0.07)
Basophils Absolute: 0 10*3/uL (ref 0.0–0.1)
Basophils Relative: 0 %
Eosinophils Absolute: 0 10*3/uL (ref 0.0–0.5)
Eosinophils Relative: 0 %
HCT: 33.6 % — ABNORMAL LOW (ref 36.0–46.0)
Hemoglobin: 11.1 g/dL — ABNORMAL LOW (ref 12.0–15.0)
Immature Granulocytes: 0 %
Lymphocytes Relative: 14 %
Lymphs Abs: 1.4 10*3/uL (ref 0.7–4.0)
MCH: 28.8 pg (ref 26.0–34.0)
MCHC: 33 g/dL (ref 30.0–36.0)
MCV: 87.3 fL (ref 80.0–100.0)
Monocytes Absolute: 0.7 10*3/uL (ref 0.1–1.0)
Monocytes Relative: 7 %
Neutro Abs: 7.5 10*3/uL (ref 1.7–7.7)
Neutrophils Relative %: 79 %
Platelets: 104 10*3/uL — ABNORMAL LOW (ref 150–400)
RBC: 3.85 MIL/uL — ABNORMAL LOW (ref 3.87–5.11)
RDW: 14 % (ref 11.5–15.5)
WBC: 9.6 10*3/uL (ref 4.0–10.5)
nRBC: 0 % (ref 0.0–0.2)

## 2020-08-17 LAB — COMPREHENSIVE METABOLIC PANEL
ALT: 16 U/L (ref 0–44)
AST: 18 U/L (ref 15–41)
Albumin: 2.8 g/dL — ABNORMAL LOW (ref 3.5–5.0)
Alkaline Phosphatase: 51 U/L (ref 38–126)
Anion gap: 9 (ref 5–15)
BUN: 17 mg/dL (ref 8–23)
CO2: 25 mmol/L (ref 22–32)
Calcium: 8.2 mg/dL — ABNORMAL LOW (ref 8.9–10.3)
Chloride: 99 mmol/L (ref 98–111)
Creatinine, Ser: 0.91 mg/dL (ref 0.44–1.00)
GFR, Estimated: >60 mL/min (ref >60)
Glucose, Bld: 157 mg/dL — ABNORMAL HIGH (ref 70–99)
Potassium: 3.3 mmol/L — ABNORMAL LOW (ref 3.5–5.1)
Sodium: 133 mmol/L — ABNORMAL LOW (ref 135–145)
Total Bilirubin: 0.6 mg/dL (ref 0.3–1.2)
Total Protein: 6 g/dL — ABNORMAL LOW (ref 6.5–8.1)

## 2020-08-17 LAB — URINALYSIS, ROUTINE W REFLEX MICROSCOPIC
Bilirubin Urine: NEGATIVE
Glucose, UA: NEGATIVE mg/dL
Ketones, ur: NEGATIVE mg/dL
Nitrite: POSITIVE — AB
Protein, ur: NEGATIVE mg/dL
Specific Gravity, Urine: 1.025 (ref 1.005–1.030)
pH: 6 (ref 5.0–8.0)

## 2020-08-17 LAB — URINALYSIS, MICROSCOPIC (REFLEX): WBC, UA: >50 WBC/hpf (ref 0–5)

## 2020-08-17 LAB — RESP PANEL BY RT-PCR (FLU A&B, COVID) ARPGX2
Influenza A by PCR: NEGATIVE
Influenza B by PCR: NEGATIVE
SARS Coronavirus 2 by RT PCR: NEGATIVE

## 2020-08-17 LAB — LACTIC ACID, PLASMA: Lactic Acid, Venous: 1.4 mmol/L (ref 0.5–1.9)

## 2020-08-17 IMAGING — DX DG CHEST 1V PORT
1 series · 1 of 1 positions shown · non-contrast
Comparison: [DATE]

CLINICAL DATA: Fever, chemo

EXAM:
PORTABLE CHEST 1 VIEW

[chest ap]
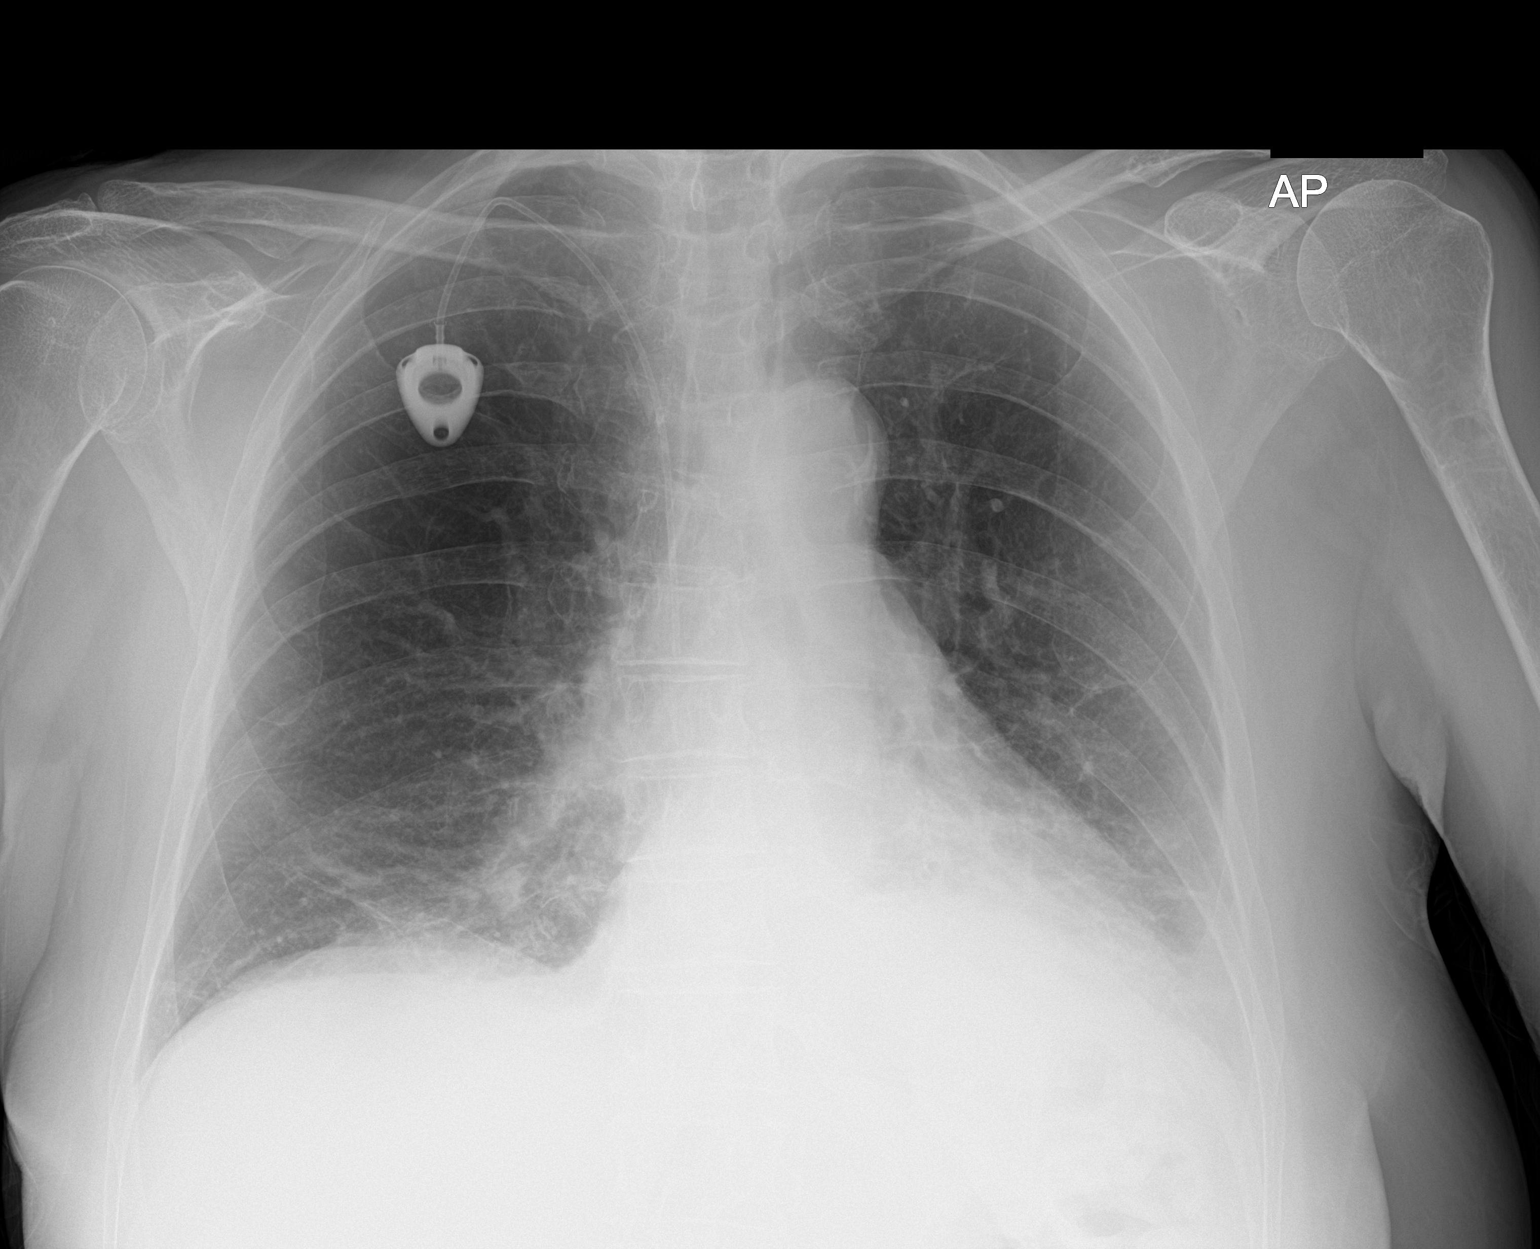

[1 of 1 positions shown; findings below may reference images not displayed]

FINDINGS: Right-sided central venous port tip over the SVC. Emphysematous
disease. Streaky atelectasis or scar at the right base. Airspace
opacity left base, question mild pneumonia. Stable cardiomediastinal
silhouette with aortic atherosclerosis. No pneumothorax.
IMPRESSION: Airspace opacity at the left base suspicious for pneumonia.
Emphysema. Scarring at the right base.

## 2020-08-17 IMAGING — CT CT RENAL STONE PROTOCOL
2 of 4 series · 15 of 46 positions shown, 17 images · non-contrast
Comparison: Chest x-ray [DATE], ERCP [DATE], CT angio chest
[DATE]

CLINICAL DATA: Right sided low back pain. Hx colon cancer and
chemotherapy, hernia repair, cholecystectomy, bladder tack, and
abdominal hysterectomy.

EXAM:
CT ABDOMEN AND PELVIS WITHOUT CONTRAST
TECHNIQUE: Multidetector CT imaging of the abdomen and pelvis was performed
following the standard protocol without IV contrast.

[Series 2: axial st · axial · 0.94mm/px · z∈[-314,+126]mm · 12 of 96 slices shown, 14 images]
[im 4/96  soft-tissue]
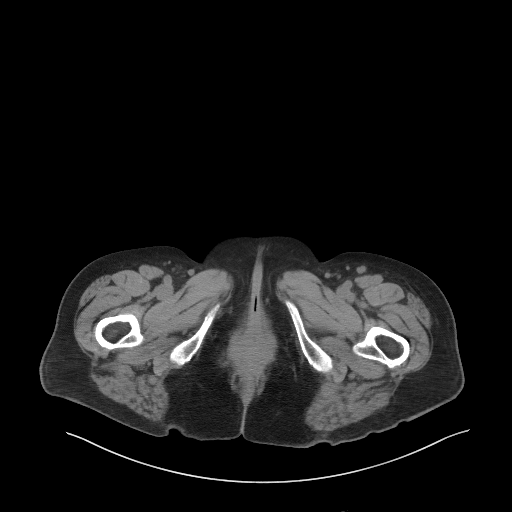
[im 4/96  bone]
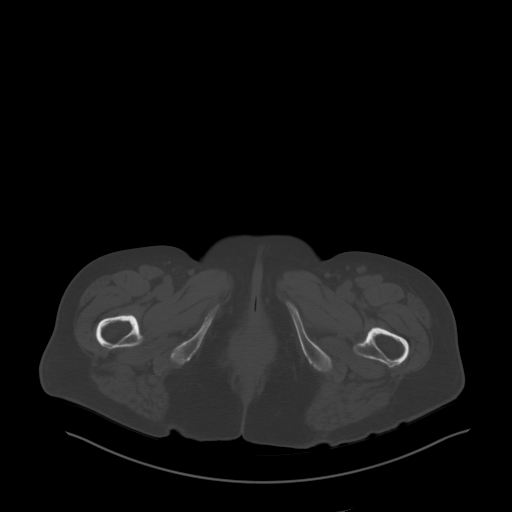
[im 12/96  soft-tissue]
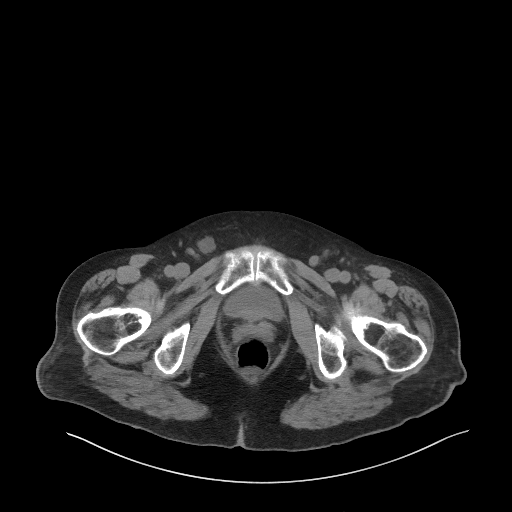
[im 20/96  soft-tissue]
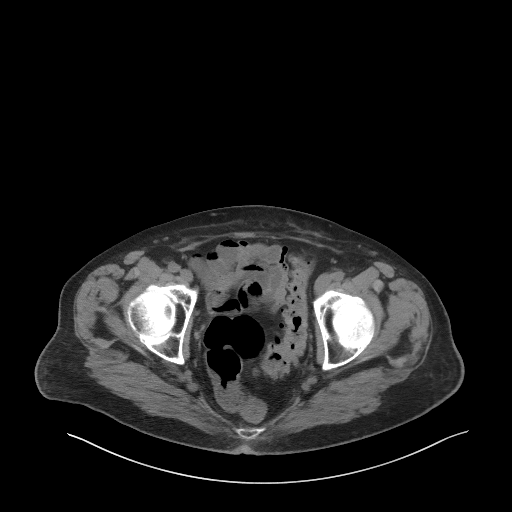
[im 28/96  soft-tissue]
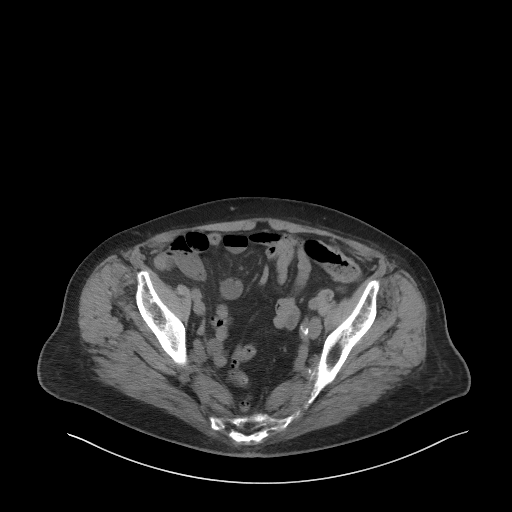
[im 36/96  soft-tissue]
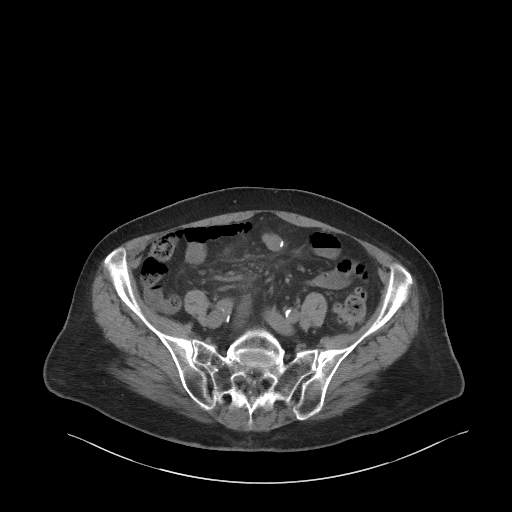
[im 44/96  soft-tissue]
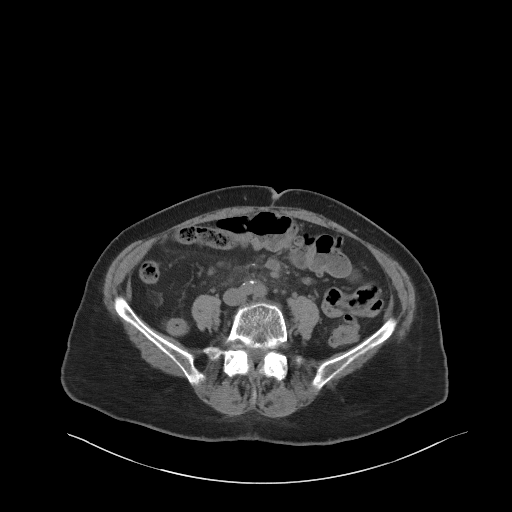
[im 52/96  soft-tissue]
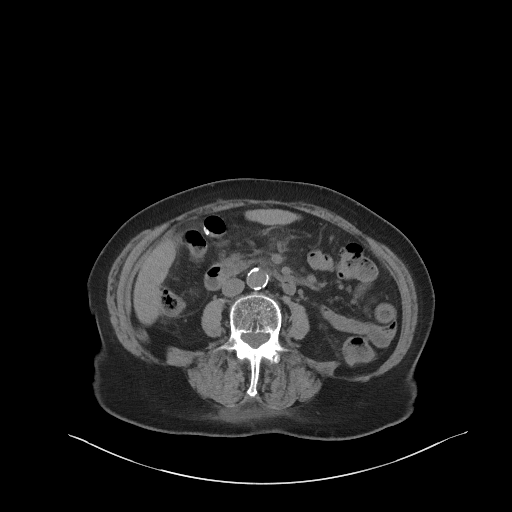
[im 60/96  soft-tissue]
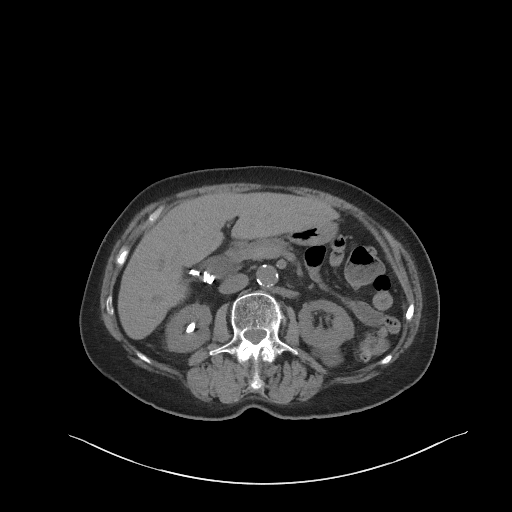
[im 68/96  soft-tissue]
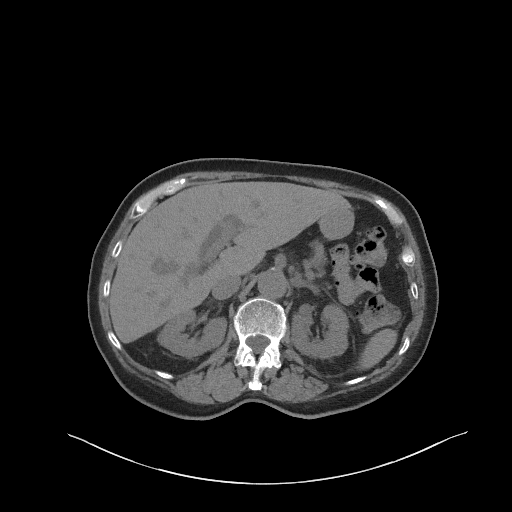
[im 68/96  bone]
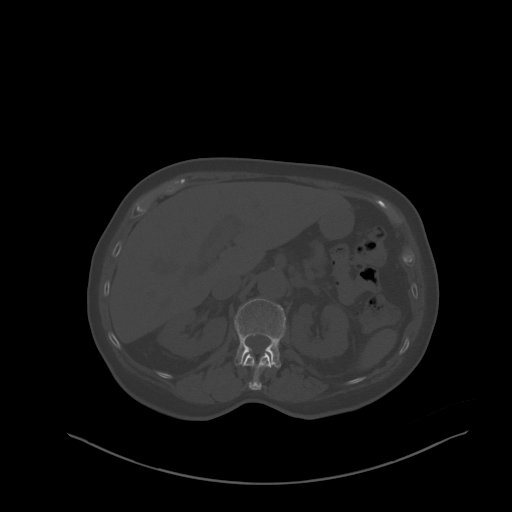
[im 76/96  soft-tissue]
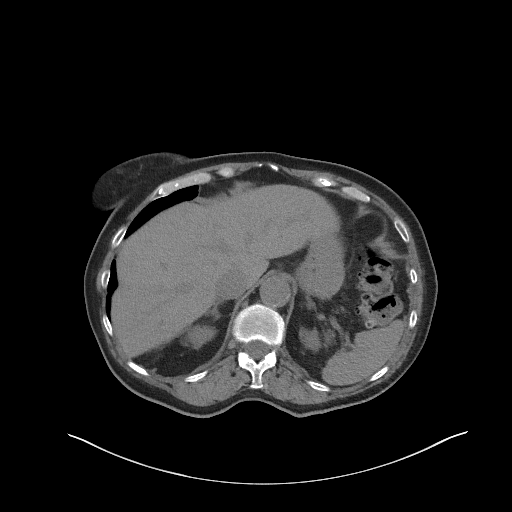
[im 84/96  soft-tissue]
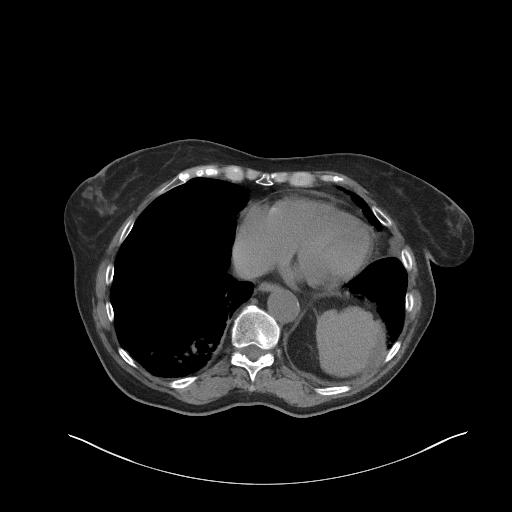
[im 92/96  soft-tissue]
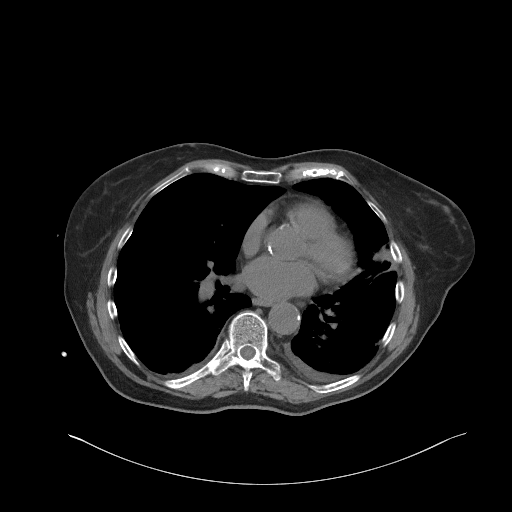

[Series 5: coronal st · coronal · 0.69mm/px · 3 of 93 slices shown]
[im 31/93  soft-tissue]
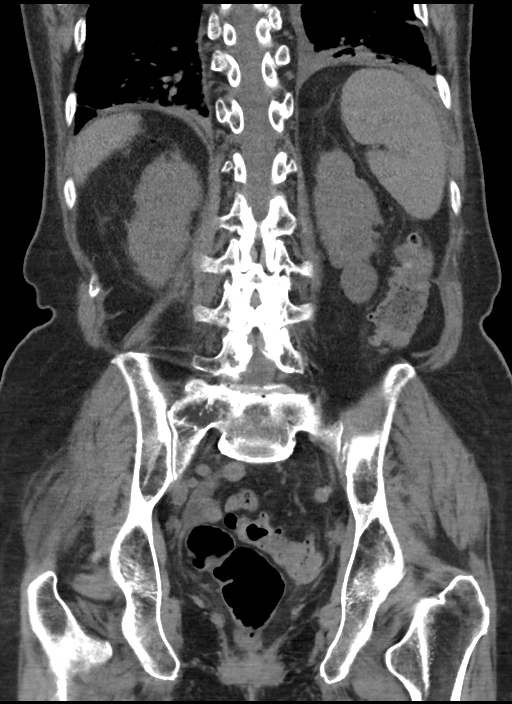
[im 41/93  soft-tissue]
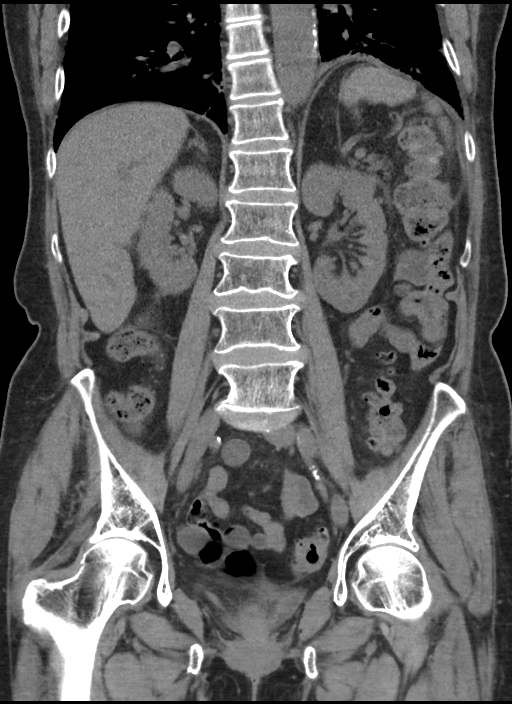
[im 52/93  soft-tissue]
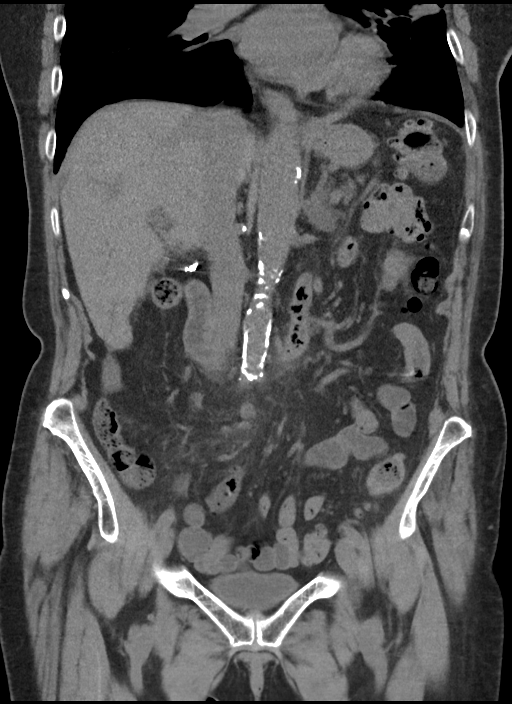

[15 of 46 positions shown; findings below may reference images not displayed]

FINDINGS: Lower chest: Elevated left hemidiaphragm. Trace bilateral pleural
effusions, left greater than right. Associated subsegmental and
passive atelectasis of bilateral lower lobes.

Hepatobiliary: No focal liver abnormality. Status post
cholecystectomy. Intra and extrahepatic biliary ductal dilatation
with the common bile duct measuring up to 20 mm.

Pancreas: Diffusely atrophic. No focal lesion. Otherwise normal
pancreatic contour. No surrounding inflammatory changes. No main
pancreatic ductal dilatation.

Spleen: Normal in size without focal abnormality.

Adrenals/Urinary Tract: No adrenal nodule bilaterally. Fluid density
lesions within left kidney likely represent simple renal cysts. Two
adjacent calcified stones are noted within the right kidney
measuring up to 9 mm. No left nephrolithiasis. No hydronephrosis
bilaterally. No contour-deforming renal mass. No ureterolithiasis or
hydroureter. The urinary bladder is unremarkable.

Stomach/Bowel: Surgical changes related to a partial colectomy.
Stomach is within normal limits. No evidence of bowel wall
thickening or dilatation. Diffuse colonic diverticulosis. The
appendix has been removed.

Vascular/Lymphatic: No abdominal aorta or iliac aneurysm. Moderate
to severe atherosclerotic plaque of the aorta and its branches.
Multiple prominent mesenteric lymph nodes with a couple of lymph
nodes measuring up to 1 cm ([DATE], 57). No pelvic or inguinal
lymphadenopathy.

Reproductive: Status post hysterectomy. No adnexal masses.

Other: Haziness of the small bowel mesentery with multiple prominent
lymph nodes. Trace free fluid tracks along the left pericolic
gutter. No intraperitoneal free gas. No organized fluid collection.

Musculoskeletal:

No abdominal wall hernia or abnormality.

No suspicious lytic or blastic osseous lesions. No acute displaced
fracture. Intervertebral disc space vacuum phenomenon at the L5-S1
level.
IMPRESSION: 1. Trace bilateral pleural effusions with associated bilateral lower
lobe atelectasis. Superimposed infection not excluded.
2. Nonobstructive left nephrolithiasis measuring up to 9 mm.
3. Nonspecific marked intra and extrahepatic biliary ductal
dilatation in the setting of post cholecystectomy.
4. Diffuse colonic diverticulosis with no definite findings of acute
diverticulitis in a patient status post partial colectomy.
5. Nonspecific misty mesentery of the small bowel with a couple of
borderline enlarged mesenteric lymph nodes. Differential diagnosis
includes panniculitis, neoplasm, idiopathic, venous thrombosis.
6.  Aortic Atherosclerosis ([P2]-[P2]).

## 2020-08-17 MED ORDER — LIDOCAINE 4 % EX CREA
TOPICAL_CREAM | Freq: Once | CUTANEOUS | Status: AC
  Administered 2020-08-17: 1 via TOPICAL
  Filled 2020-08-17: qty 5

## 2020-08-17 MED ORDER — LIDOCAINE-PRILOCAINE 2.5-2.5 % EX CREA: TOPICAL_CREAM | Freq: Once | CUTANEOUS | Status: DC

## 2020-08-17 MED ORDER — SODIUM CHLORIDE 0.9 % IV BOLUS
1000.0000 mL | Freq: Once | INTRAVENOUS | Status: AC
  Administered 2020-08-17: 1000 mL via INTRAVENOUS

## 2020-08-17 MED ORDER — SODIUM CHLORIDE 0.9 % IV SOLN
1.0000 g | Freq: Once | INTRAVENOUS | Status: AC
  Administered 2020-08-17: 1 g via INTRAVENOUS
  Filled 2020-08-17: qty 10

## 2020-08-17 MED ORDER — ACETAMINOPHEN 500 MG PO TABS
1000.0000 mg | ORAL_TABLET | Freq: Once | ORAL | Status: AC
  Administered 2020-08-17: 1000 mg via ORAL
  Filled 2020-08-17: qty 2

## 2020-08-17 MED ORDER — HYDROCODONE-ACETAMINOPHEN 5-325 MG PO TABS: 1.0000 | ORAL_TABLET | Freq: Four times a day (QID) | ORAL | 0 refills | Status: DC | PRN

## 2020-08-17 MED ORDER — MORPHINE SULFATE (PF) 4 MG/ML IV SOLN
4.0000 mg | Freq: Once | INTRAVENOUS | Status: AC
  Administered 2020-08-17: 4 mg via INTRAVENOUS
  Filled 2020-08-17: qty 1

## 2020-08-17 MED ORDER — CEPHALEXIN 500 MG PO CAPS: 500.0000 mg | ORAL_CAPSULE | Freq: Three times a day (TID) | ORAL | 0 refills | Status: DC

## 2020-08-17 MED ORDER — ALBUTEROL SULFATE HFA 108 (90 BASE) MCG/ACT IN AERS
INHALATION_SPRAY | RESPIRATORY_TRACT | Status: AC
  Administered 2020-08-17: 2 via RESPIRATORY_TRACT
  Filled 2020-08-17: qty 6.7

## 2020-08-17 MED ORDER — ALBUTEROL SULFATE HFA 108 (90 BASE) MCG/ACT IN AERS
2.0000 | INHALATION_SPRAY | RESPIRATORY_TRACT | Status: DC | PRN
  Administered 2020-08-17: 2 via RESPIRATORY_TRACT

## 2020-08-17 NOTE — ED Triage Notes (Signed)
Pt. Is here due to R lower back pain that started today.  She reports she is a colon cancer Pt. And had her last chemo treatment on March 8th.  Pt. Does have a temp tonight of 102.0 in tirage. Pt. Has a R upper Port for her treatments.

## 2020-08-17 NOTE — ED Notes (Signed)
Pt requesting EMLA cream. States she does not want to be stuck before she is numb.

## 2020-08-17 NOTE — ED Notes (Signed)
Pt requests for

## 2020-08-17 NOTE — ED Provider Notes (Signed)
[facility] Provider Note   CSN: [id number] Arrival date & time: 08/17/20  0007     History Chief Complaint  Patient presents with   Back Pain    Kelly [name] is a 75 y.o. female.  Patient is a 75 year old female with past medical history of colon cancer currently undergoing chemotherapy with her most recent treatment on March 8.  She also has a history of COPD, prior cholecystectomy, and hysterectomy.  Patient presents today for evaluation of fever.  Patient started this morning feeling poorly.  She describes discomfort to her right flank that is worse when she moves or breathes.  She denies cough, vomiting, or diarrhea.  Patient found to be febrile here with a temperature of 102.  The history is provided by the patient.  Flank Pain This is a new problem. The current episode started 2 days ago (This morning). The problem occurs constantly. The problem has been gradually worsening. Pertinent negatives include no shortness of breath. Exacerbated by: Deep breathing, palpation, and movement. Nothing relieves the symptoms. She has tried nothing for the symptoms.       Past Medical History:  Diagnosis Date   Colon cancer [facility])    with surgery in Jan. 2022   COPD (chronic obstructive pulmonary disease) ([place])     Patient Active Problem List   Diagnosis Date Noted   Choledocholithiasis 10/24/2015   Acute gallstone pancreatitis s/p lap cholecystectomy 10/26/2015 10/24/2015   COPD (chronic obstructive pulmonary disease) ([place]) 10/24/2015    Past Surgical History:  Procedure Laterality Date   ABDOMINAL HYSTERECTOMY  06/2010   bladder tack     CHOLECYSTECTOMY N/A 10/26/2015   Procedure: LAPAROSCOPIC CHOLECYSTECTOMY WITH INTRAOPERATIVE CHOLANGIOGRAM;  Surgeon: Michael Boston, MD;  Location: [facility];  Service: General;  Laterality: N/A;   DILATION AND CURETTAGE OF UTERUS  06/2010   ERCP N/A 10/25/2015   Procedure: ENDOSCOPIC RETROGRADE  CHOLANGIOPANCREATOGRAPHY (ERCP);  Surgeon: Milus Banister, MD;  Location: [facility];  Service: Endoscopy;  Laterality: N/A;   HERNIA REPAIR     inguinal hernia repair - at 75 years old   TONSILLECTOMY       OB History   No obstetric history on file.     No family history on file.  Social History   Tobacco Use   Smoking status: Former Smoker    Quit date: 06/02/2009    Years since quitting: 11.2   Smokeless tobacco: Never Used  Substance Use Topics   Alcohol use: No   Drug use: No    Home Medications Prior to Admission medications   Medication Sig Start Date End Date Taking? Authorizing Provider  Biotin 5 MG TABS Take 5 mg by mouth daily.    [provider]  oxyCODONE (OXY IR/ROXICODONE) 5 MG immediate release tablet Take 1 tablet (5 mg total) by mouth every 6 (six) hours as needed for moderate pain or severe pain. 10/27/15   Thurnell Lose, MD    Allergies    Sulfa antibiotics  Review of Systems   Review of Systems  Respiratory: Negative for shortness of breath.   Genitourinary: Positive for flank pain.    Physical Exam Updated Vital Signs BP 103/60 (BP Location: Right Arm)    Pulse 86    Temp (!) 102 F (38.9 C) (Oral)    Resp 16    Ht 5\' 9"  (1.753 m)    Wt 68.9 kg    SpO2 90%    BMI 22.45 kg/m  Physical Exam  ED Results / Procedures / Treatments   Labs (all labs ordered are listed, but only abnormal results are displayed) Labs Reviewed  URINE CULTURE  RESP PANEL BY RT-PCR (FLU A&B, COVID) ARPGX2  CULTURE, BLOOD (ROUTINE X 2)  CULTURE, BLOOD (ROUTINE X 2)  COMPREHENSIVE METABOLIC PANEL  CBC WITH DIFFERENTIAL/PLATELET  LACTIC ACID, PLASMA  LACTIC ACID, PLASMA  URINALYSIS, ROUTINE W REFLEX MICROSCOPIC    EKG None  Radiology No results found.  Procedures Procedures   Medications Ordered in ED Medications  acetaminophen (TYLENOL) tablet 1,000 mg (has no administration in time range)  sodium chloride 0.9 % bolus 1,000 mL (has  no administration in time range)    ED Course  I have reviewed the triage vital signs and the nursing notes.  Pertinent labs & imaging results that were available during my care of the patient were reviewed by me and considered in my medical decision making (see chart for details).    MDM Rules/Calculators/A&P  Patient is a 75 year old female with history of colon cancer presenting with complaints of pain in her right flank and fever.  Patient has a urinary tract infection on her UA, but laboratory studies are otherwise unremarkable.  CT scan shows no evidence for renal calculus.  Patient has ambulated in the department and feels fine, however her oxygen saturations have dropped to the upper 80s.  There is the suggestion of possible pneumonia on her chest x-ray, however this does not fit the clinical picture patient is not coughing.  She also tells me that she has COPD and oxygen saturations generally run low.  I have considered pulmonary embolism, however patient is having no chest pain, no tachycardia.  At this point, patient feels well and wants to go home.  She will be discharged with Keflex for her UTI and is to follow-up with her oncologist as scheduled next week.  Final Clinical Impression(s) / ED Diagnoses Final diagnoses:  None    Rx / DC Orders ED Discharge Orders    None       Veryl Speak, MD 08/17/20 [phone]

## 2020-08-17 NOTE — Discharge Instructions (Signed)
Begin taking Keflex as prescribed.  Begin taking hydrocodone as prescribed as needed for pain.  Follow-up with your oncologist as scheduled, and return to the ER if you develop worsening breathing, chest pain, worsening flank pain, or other new and concerning symptoms.

## 2020-08-19 LAB — URINE CULTURE: Culture: 70000 — AB

## 2020-08-22 LAB — CULTURE, BLOOD (ROUTINE X 2)
Culture: NO GROWTH
Culture: NO GROWTH
Specimen Description: ADEQUATE
Specimen Description: ADEQUATE

## 2021-01-26 ENCOUNTER — Emergency Department (HOSPITAL_BASED_OUTPATIENT_CLINIC_OR_DEPARTMENT_OTHER): Payer: Medicare PPO

## 2021-01-26 ENCOUNTER — Encounter (HOSPITAL_BASED_OUTPATIENT_CLINIC_OR_DEPARTMENT_OTHER): Payer: Self-pay | Admitting: Emergency Medicine

## 2021-01-26 ENCOUNTER — Observation Stay (HOSPITAL_BASED_OUTPATIENT_CLINIC_OR_DEPARTMENT_OTHER)
Admission: EM | Admit: 2021-01-26 | Discharge: 2021-01-28 | Disposition: A | Discharge status: Home / Self Care | Payer: Medicare PPO | Attending: Internal Medicine | Admitting: Internal Medicine

## 2021-01-26 ENCOUNTER — Other Ambulatory Visit: Payer: Self-pay

## 2021-01-26 DIAGNOSIS — K5289 Other specified noninfective gastroenteritis and colitis: Secondary | ICD-10-CM | POA: Insufficient documentation

## 2021-01-26 DIAGNOSIS — D49511 Neoplasm of unspecified behavior of right kidney: Secondary | ICD-10-CM | POA: Diagnosis present

## 2021-01-26 DIAGNOSIS — D49512 Neoplasm of unspecified behavior of left kidney: Secondary | ICD-10-CM

## 2021-01-26 DIAGNOSIS — J449 Chronic obstructive pulmonary disease, unspecified: Secondary | ICD-10-CM | POA: Insufficient documentation

## 2021-01-26 DIAGNOSIS — K5900 Constipation, unspecified: Secondary | ICD-10-CM | POA: Diagnosis present

## 2021-01-26 DIAGNOSIS — Z79899 Other long term (current) drug therapy: Secondary | ICD-10-CM | POA: Diagnosis not present

## 2021-01-26 DIAGNOSIS — Z87891 Personal history of nicotine dependence: Secondary | ICD-10-CM | POA: Diagnosis not present

## 2021-01-26 DIAGNOSIS — Z20822 Contact with and (suspected) exposure to covid-19: Secondary | ICD-10-CM | POA: Insufficient documentation

## 2021-01-26 DIAGNOSIS — K5641 Fecal impaction: Secondary | ICD-10-CM | POA: Insufficient documentation

## 2021-01-26 DIAGNOSIS — Z85038 Personal history of other malignant neoplasm of large intestine: Secondary | ICD-10-CM | POA: Insufficient documentation

## 2021-01-26 DIAGNOSIS — R1909 Other intra-abdominal and pelvic swelling, mass and lump: Secondary | ICD-10-CM

## 2021-01-26 DIAGNOSIS — G620 Drug-induced polyneuropathy: Secondary | ICD-10-CM | POA: Insufficient documentation

## 2021-01-26 DIAGNOSIS — T451X5A Adverse effect of antineoplastic and immunosuppressive drugs, initial encounter: Secondary | ICD-10-CM | POA: Diagnosis present

## 2021-01-26 DIAGNOSIS — C189 Malignant neoplasm of colon, unspecified: Secondary | ICD-10-CM | POA: Diagnosis present

## 2021-01-26 DIAGNOSIS — N433 Hydrocele, unspecified: Secondary | ICD-10-CM | POA: Diagnosis present

## 2021-01-26 DIAGNOSIS — C641 Malignant neoplasm of right kidney, except renal pelvis: Secondary | ICD-10-CM | POA: Diagnosis not present

## 2021-01-26 DIAGNOSIS — I82220 Acute embolism and thrombosis of inferior vena cava: Secondary | ICD-10-CM | POA: Insufficient documentation

## 2021-01-26 LAB — CBC WITH DIFFERENTIAL/PLATELET
Abs Immature Granulocytes: 0.05 10*3/uL (ref 0.00–0.07)
Basophils Absolute: 0.1 10*3/uL (ref 0.0–0.1)
Basophils Relative: 0 %
Eosinophils Absolute: 0 10*3/uL (ref 0.0–0.5)
Eosinophils Relative: 0 %
HCT: 41.2 % (ref 36.0–46.0)
Hemoglobin: 13.6 g/dL (ref 12.0–15.0)
Immature Granulocytes: 0 %
Lymphocytes Relative: 16 %
Lymphs Abs: 1.8 10*3/uL (ref 0.7–4.0)
MCH: 31.8 pg (ref 26.0–34.0)
MCHC: 33 g/dL (ref 30.0–36.0)
MCV: 96.3 fL (ref 80.0–100.0)
Monocytes Absolute: 0.9 10*3/uL (ref 0.1–1.0)
Monocytes Relative: 8 %
Neutro Abs: 8.6 10*3/uL — ABNORMAL HIGH (ref 1.7–7.7)
Neutrophils Relative %: 76 %
Platelets: 147 10*3/uL — ABNORMAL LOW (ref 150–400)
RBC: 4.28 MIL/uL (ref 3.87–5.11)
RDW: 14.8 % (ref 11.5–15.5)
WBC: 11.4 10*3/uL — ABNORMAL HIGH (ref 4.0–10.5)
nRBC: 0 % (ref 0.0–0.2)

## 2021-01-26 LAB — COMPREHENSIVE METABOLIC PANEL
ALT: 14 U/L (ref 0–44)
AST: 22 U/L (ref 15–41)
Albumin: 3.6 g/dL (ref 3.5–5.0)
Alkaline Phosphatase: 85 U/L (ref 38–126)
Anion gap: 9 (ref 5–15)
BUN: 22 mg/dL (ref 8–23)
CO2: 24 mmol/L (ref 22–32)
Calcium: 8.7 mg/dL — ABNORMAL LOW (ref 8.9–10.3)
Chloride: 106 mmol/L (ref 98–111)
Creatinine, Ser: 0.82 mg/dL (ref 0.44–1.00)
GFR, Estimated: >60 mL/min (ref >60)
Glucose, Bld: 109 mg/dL — ABNORMAL HIGH (ref 70–99)
Potassium: 4.1 mmol/L (ref 3.5–5.1)
Sodium: 139 mmol/L (ref 135–145)
Total Bilirubin: 0.7 mg/dL (ref 0.3–1.2)
Total Protein: 6.7 g/dL (ref 6.5–8.1)

## 2021-01-26 LAB — LIPASE, BLOOD: Lipase: 23 U/L (ref 11–51)

## 2021-01-26 IMAGING — CT CT ABD-PELV W/ CM
2 of 5 series · 15 of 46 positions shown, 17 images · IV contrast (Omnipaque)
Comparison: [DATE]

CLINICAL DATA: Bowel obstruction suspected

EXAM:
CT ABDOMEN AND PELVIS WITH CONTRAST
TECHNIQUE: Multidetector CT imaging of the abdomen and pelvis was performed
using the standard protocol following bolus administration of
intravenous contrast.
CONTRAST:  100mL OMNIPAQUE IOHEXOL 350 MG/ML SOLN

[Series 2: axial st · axial · 0.83mm/px · z∈[+524,+924]mm · 12 of 91 slices shown, 14 images]
[im 6/91  soft-tissue]
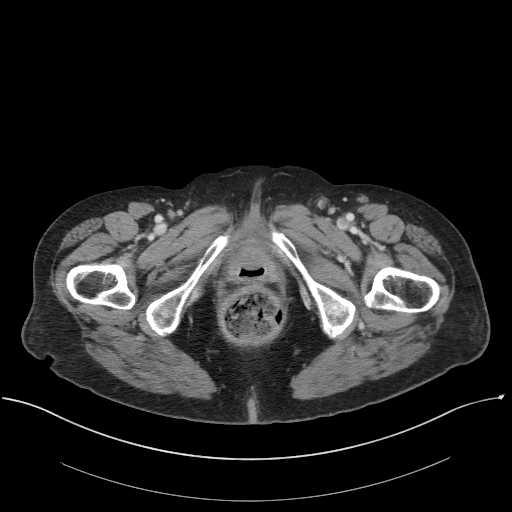
[im 6/91  bone]
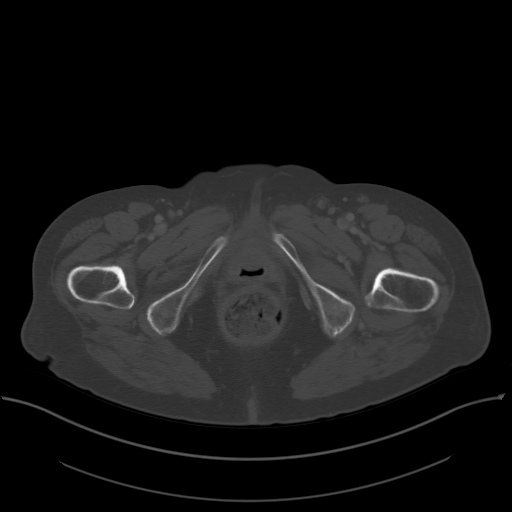
[im 16/91  soft-tissue]
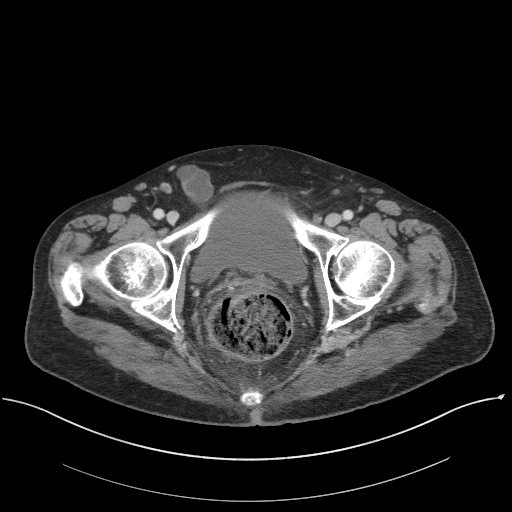
[im 21/91  soft-tissue]
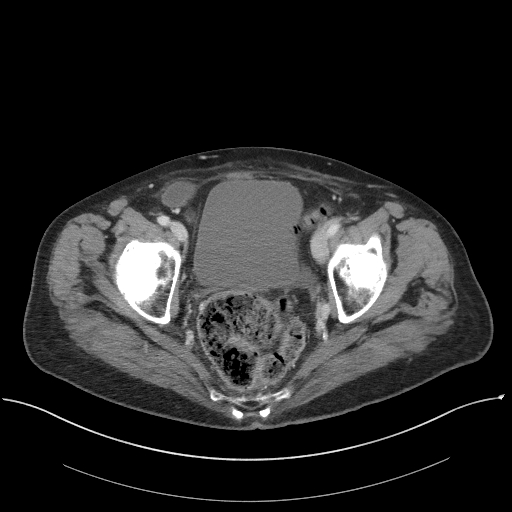
[im 26/91  soft-tissue]
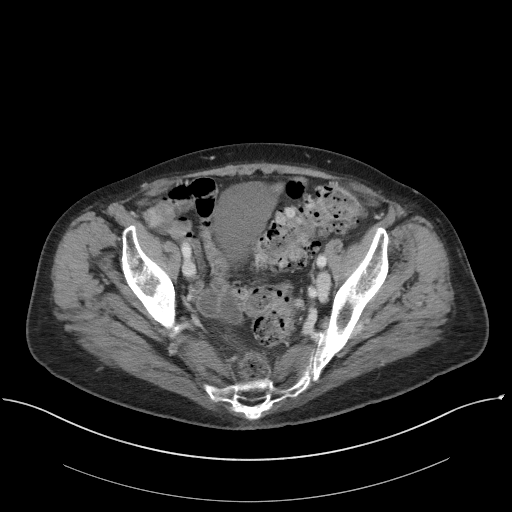
[im 36/91  soft-tissue]
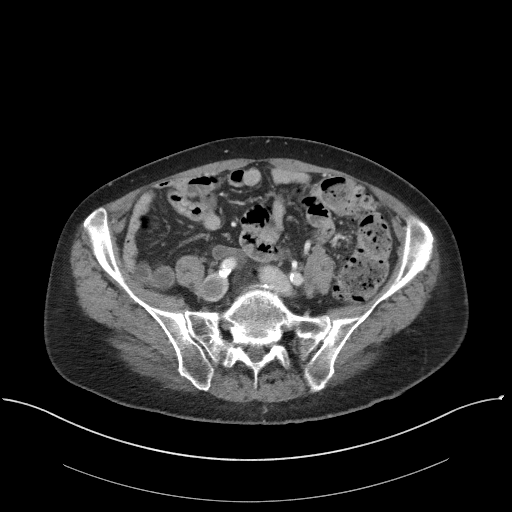
[im 41/91  soft-tissue]
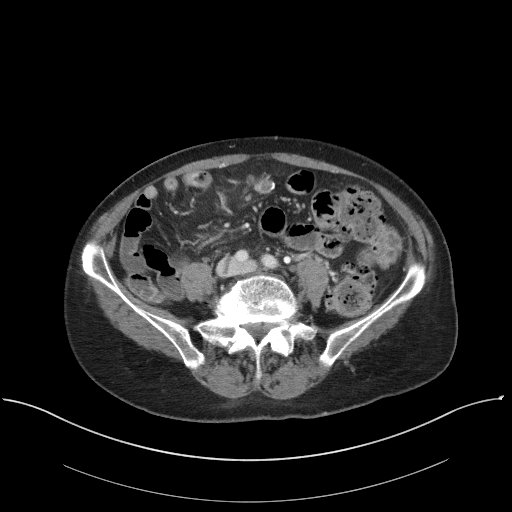
[im 51/91  soft-tissue]
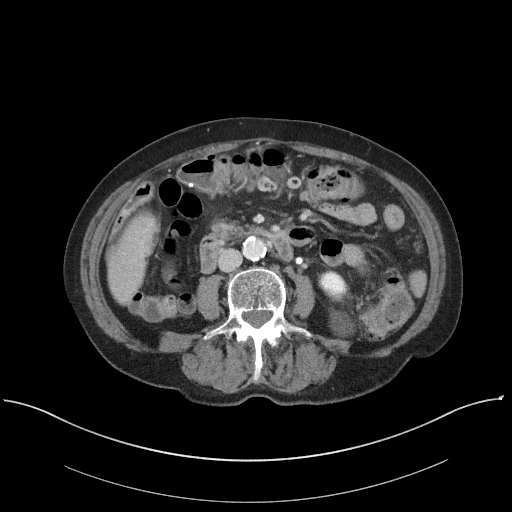
[im 56/91  soft-tissue]
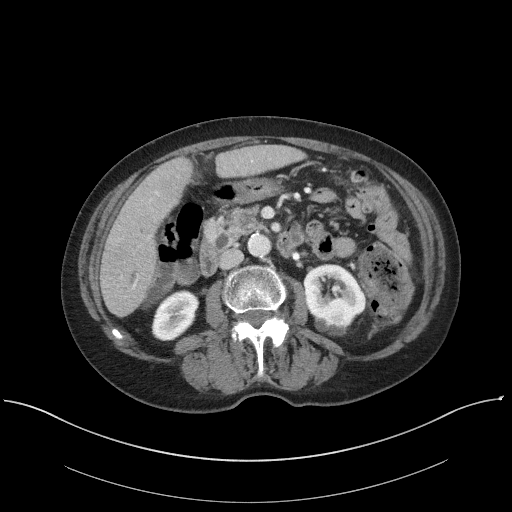
[im 66/91  soft-tissue]
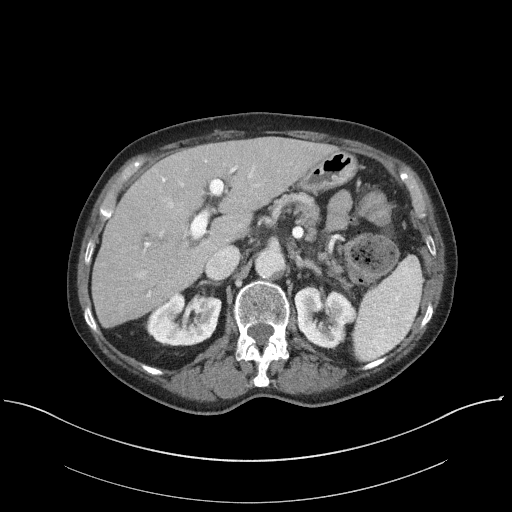
[im 66/91  bone]
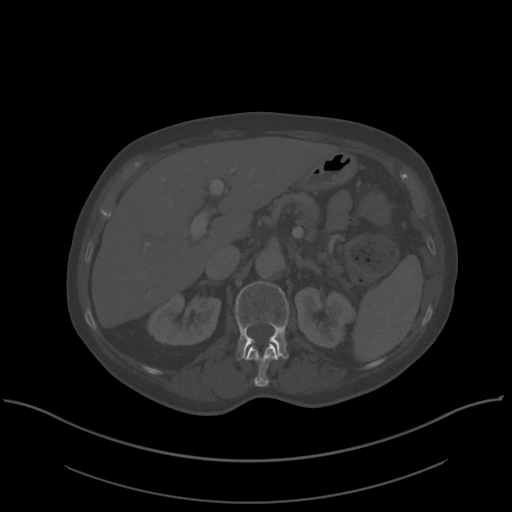
[im 71/91  soft-tissue]
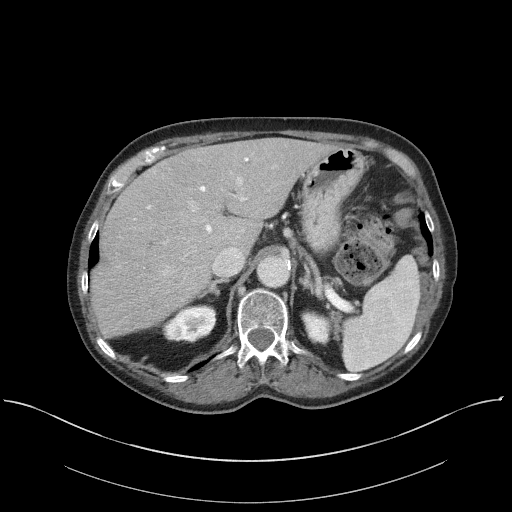
[im 76/91  soft-tissue]
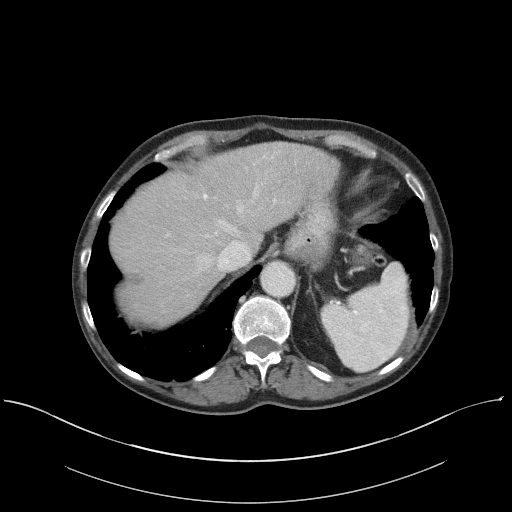
[im 86/91  soft-tissue]
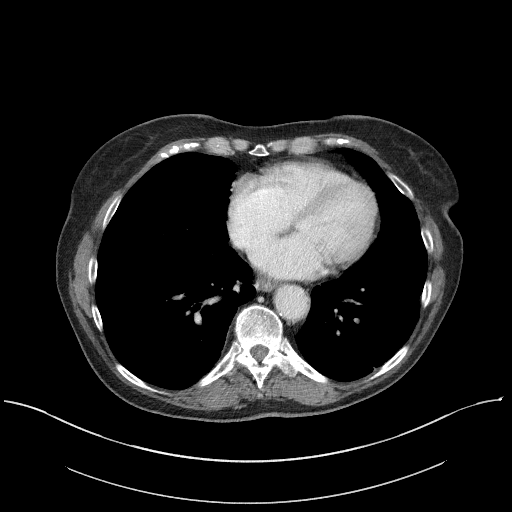

[Series 5: coronal st · coronal · 0.68mm/px · 3 of 101 slices shown]
[im 34/101  soft-tissue]
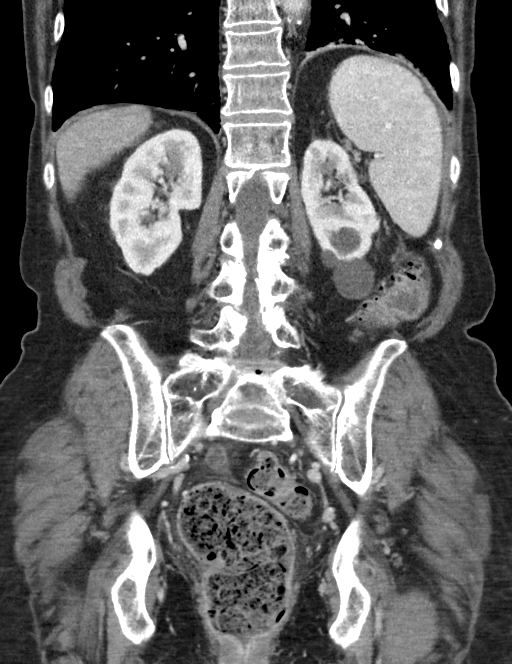
[im 45/101  soft-tissue]
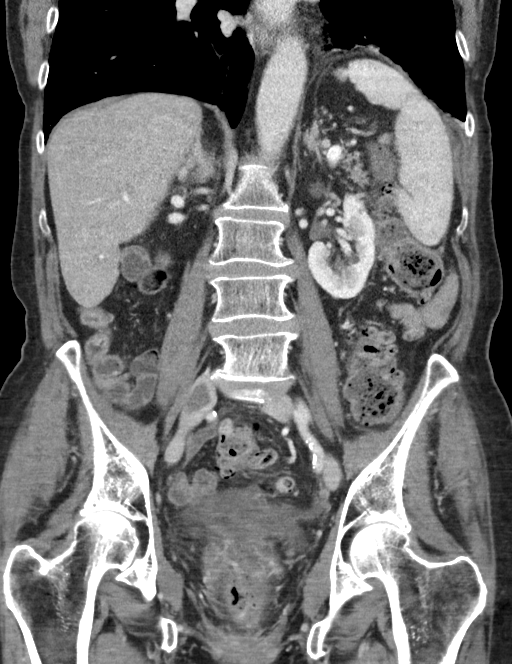
[im 56/101  soft-tissue]
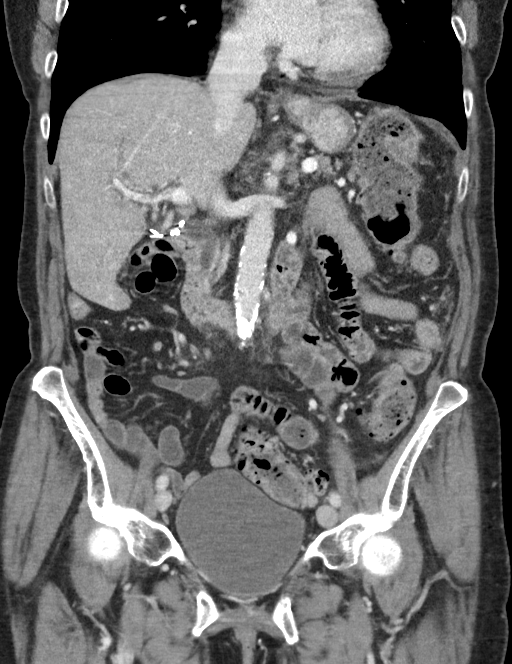

[15 of 46 positions shown; findings below may reference images not displayed]

FINDINGS: Lower chest: LEFT basilar consolidative opacity, improved since
prior and likely reflecting atelectasis. Improvement of RIGHT
basilar consolidative opacity with minimal residual linear opacity
consistent with atelectasis/scar.

Hepatobiliary: Status post cholecystectomy. There is dilation of the
common bile duct proximally 15 mm, previously 17 mm. No focal
hepatic lesion identified.

Pancreas: Unremarkable. No pancreatic ductal dilatation or
surrounding inflammatory changes.

Spleen: Splenomegaly, mild.

Adrenals/Urinary Tract: Adrenal glands are unremarkable. There is an
enhancing exophytic mass of the LEFT kidney which measures 11 mm
(series 5, image 36). Multiple renal cysts. Nonobstructive
RIGHT-sided nephrolithiasis. Bladder is distended.

Stomach/Bowel: There is large rectal stool ball with adjacent mild
rectal wall thickening and fat stranding. Extensive diverticulosis.
Status post RIGHT colonic surgery. No evidence of appendicitis.
There is moderate colonic stool burden predominately within the LEFT
hemicolon.

Vascular/Lymphatic: Atherosclerotic calcifications of the aorta.
Circumaortic LEFT renal vein

Reproductive: Status post hysterectomy.

Other: Increase in size of a low-density masslike area in the RIGHT
inguinal canal. It measures 33 x 27 mm, previously 14 x 14 mm
(series 2, image 77). There is a nonobstructive filling defect
within the inferior vena cava extending into the RIGHT common iliac
vein consistent with thrombus.

Musculoskeletal: Transitional anatomy with bilateral assimilation
joints at L5-S1.
IMPRESSION: 1. Moderate to large stool ball with adjacent bowel wall thickening
and fat stranding. Findings likely reflect stercoral colitis.
2. There is a nonobstructive thrombus within the IVC extending into
the RIGHT common iliac vein.
3. There is a 11 mm enhancing mass of the LEFT kidney which is
concerning for renal cell carcinoma. Recommend dedicated evaluation
with renal cell carcinoma protocol CT or MRI when clinically
appropriate.
4. Cystic density within the RIGHT inguinal canal may reflect
loculated hydrocele (favored). Additional differential
considerations include a necrotic lymph node. Recommend correlation
physical exam and ultrasound as deemed necessary

These results were called by telephone at the time of interpretation
on [DATE] at [DATE] to provider ELISAMARA , who verbally
acknowledged these results.

## 2021-01-26 MED ORDER — ENOXAPARIN SODIUM 80 MG/0.8ML IJ SOSY
70.0000 mg | PREFILLED_SYRINGE | Freq: Once | INTRAMUSCULAR | Status: AC
  Administered 2021-01-26: 70 mg via SUBCUTANEOUS
  Filled 2021-01-26: qty 0.8

## 2021-01-26 MED ORDER — IOHEXOL 350 MG/ML SOLN
100.0000 mL | Freq: Once | INTRAVENOUS | Status: AC | PRN
  Administered 2021-01-26: 100 mL via INTRAVENOUS

## 2021-01-26 NOTE — ED Triage Notes (Signed)
Pt arrives pov with c/o constipation x 3 days. Pt endorses rectal pressure, 2 dulcolax this am, fleet enema at 1345 today. Pt also reports swollen hemorrhoids

## 2021-01-26 NOTE — ED Provider Notes (Signed)
[facility] EMERGENCY DEPARTMENT Provider Note   CSN: [id number] Arrival date & time: 01/26/21  1506     History Chief Complaint  Patient presents with   Constipation    Kelly [name] is a 75 y.o. female.  Kelly [name] has a history of colon cancer status post colon resection and reanastomosis.  She finished chemotherapy 1 month ago.  She presents complaining of constipation.  She saw her doctor yesterday for a right lower quadrant mass.  Her doctor mentioned that if she became constipated, she would need to seek evaluation.  She realized today that she had not had a bowel movement in several days.  She attempted to treat this with home therapy, Dulcolax and an enema, but she has not had a bowel movement.  She is feeling some rectal pressure when she sits.  The history is provided by the patient.  Constipation Severity:  Moderate Time since last bowel movement: unknown but at least 2 days. Timing:  Constant Progression:  Unchanged Chronicity:  Recurrent Context comment:  No known cause Stool description:  None produced Unusual stool frequency:  Infrequent Relieved by:  Nothing Worsened by:  Nothing Ineffective treatments:  Enemas and laxatives Associated symptoms: no abdominal pain, no back pain, no dysuria, no fever, no nausea, no urinary retention and no vomiting       Past Medical History:  Diagnosis Date   Colon cancer ([place])    with surgery in Jan. 2022   COPD (chronic obstructive pulmonary disease) ([place])     Patient Active Problem List   Diagnosis Date Noted   Choledocholithiasis 10/24/2015   Acute gallstone pancreatitis s/p lap cholecystectomy 10/26/2015 10/24/2015   COPD (chronic obstructive pulmonary disease) ([place]) 10/24/2015    Past Surgical History:  Procedure Laterality Date   ABDOMINAL HYSTERECTOMY  06/2010   bladder tack     CHOLECYSTECTOMY N/A 10/26/2015   Procedure: LAPAROSCOPIC CHOLECYSTECTOMY WITH INTRAOPERATIVE CHOLANGIOGRAM;   Surgeon: Michael Boston, MD;  Location: [facility];  Service: General;  Laterality: N/A;   DILATION AND CURETTAGE OF UTERUS  06/2010   ERCP N/A 10/25/2015   Procedure: ENDOSCOPIC RETROGRADE CHOLANGIOPANCREATOGRAPHY (ERCP);  Surgeon: Milus Banister, MD;  Location: [facility];  Service: Endoscopy;  Laterality: N/A;   HERNIA REPAIR     inguinal hernia repair - at 75 years old   TONSILLECTOMY       OB History   No obstetric history on file.     History reviewed. No pertinent family history.  Social History   Tobacco Use   Smoking status: Former    Types: Cigarettes    Quit date: 06/02/2009    Years since quitting: 11.6   Smokeless tobacco: Never  Substance Use Topics   Alcohol use: Not Currently    Alcohol/week: 1.0 standard drink    Types: 1 Glasses of wine per week   Drug use: No    Home Medications Prior to Admission medications   Medication Sig Start Date End Date Taking? Authorizing Provider  Biotin 5 MG TABS Take 5 mg by mouth daily.    [provider]  cephALEXin (KEFLEX) 500 MG capsule Take 1 capsule (500 mg total) by mouth 3 (three) times daily. 08/17/20   Veryl Speak, MD  HYDROcodone-acetaminophen (NORCO) 5-325 MG tablet Take 1-2 tablets by mouth every 6 (six) hours as needed. 08/17/20   Veryl Speak, MD  oxyCODONE (OXY IR/ROXICODONE) 5 MG immediate release tablet Take 1 tablet (5 mg total) by mouth every 6 (six)  hours as needed for moderate pain or severe pain. 10/27/15   Thurnell Lose, MD    Allergies    Sulfa antibiotics  Review of Systems   Review of Systems  Constitutional:  Negative for chills and fever.  HENT:  Negative for ear pain and sore throat.   Eyes:  Negative for pain and visual disturbance.  Respiratory:  Negative for cough and shortness of breath.   Cardiovascular:  Negative for chest pain and palpitations.  Gastrointestinal:  Positive for constipation. Negative for abdominal pain, nausea and vomiting.  Genitourinary:  Negative for  dysuria and hematuria.  Musculoskeletal:  Negative for arthralgias and back pain.  Skin:  Negative for color change and rash.  Neurological:  Negative for seizures and syncope.  All other systems reviewed and are negative.  Physical Exam Updated Vital Signs BP (!) 164/101 (BP Location: Left Arm)   Pulse 76   Temp 98 F (36.7 C) (Oral)   Resp 20   Ht '5\' 9"'$  (1.753 m)   Wt 71.7 kg   SpO2 100%   BMI 23.33 kg/m   Physical Exam Vitals and nursing note reviewed.  Constitutional:      Appearance: She is well-developed.  HENT:     Head: Normocephalic and atraumatic.  Eyes:     Conjunctiva/sclera: Conjunctivae normal.  Cardiovascular:     Rate and Rhythm: Normal rate and regular rhythm.     Heart sounds: Normal heart sounds.  Pulmonary:     Effort: Pulmonary effort is normal. No tachypnea.     Breath sounds: Normal breath sounds.  Abdominal:     Palpations: Abdomen is soft.     Tenderness: There is no abdominal tenderness.     Hernia: A hernia (right inguinal; mild tenderness) is present.  Genitourinary:    Comments: Nonthrombosed external hemorrhoids.  Soft stool in the rectal vault Musculoskeletal:     Right lower leg: No edema.     Left lower leg: No edema.  Skin:    General: Skin is warm and dry.  Neurological:     General: No focal deficit present.     Mental Status: She is alert and oriented to person, place, and time.  Psychiatric:        Mood and Affect: Mood normal.        Behavior: Behavior normal.    ED Results / Procedures / Treatments   Labs (all labs ordered are listed, but only abnormal results are displayed) Labs Reviewed  CBC WITH DIFFERENTIAL/PLATELET - Abnormal; Notable for the following components:      Result Value   WBC 11.4 (*)    Platelets 147 (*)    Neutro Abs 8.6 (*)    All other components within normal limits  COMPREHENSIVE METABOLIC PANEL - Abnormal; Notable for the following components:   Glucose, Bld 109 (*)    Calcium 8.7 (*)     All other components within normal limits  LIPASE, BLOOD  URINALYSIS, ROUTINE W REFLEX MICROSCOPIC    EKG None  Radiology CT Abdomen Pelvis W Contrast  Result Date: 01/26/2021 CLINICAL DATA:  Bowel obstruction suspected EXAM: CT ABDOMEN AND PELVIS WITH CONTRAST TECHNIQUE: Multidetector CT imaging of the abdomen and pelvis was performed using the standard protocol following bolus administration of intravenous contrast. CONTRAST:  186m OMNIPAQUE IOHEXOL 350 MG/ML SOLN COMPARISON:  August 17, 2020 FINDINGS: Lower chest: LEFT basilar consolidative opacity, improved since prior and likely reflecting atelectasis. Improvement of RIGHT basilar consolidative opacity with minimal residual  linear opacity consistent with atelectasis/scar. Hepatobiliary: Status post cholecystectomy. There is dilation of the common bile duct proximally 15 mm, previously 17 mm. No focal hepatic lesion identified. Pancreas: Unremarkable. No pancreatic ductal dilatation or surrounding inflammatory changes. Spleen: Splenomegaly, mild. Adrenals/Urinary Tract: Adrenal glands are unremarkable. There is an enhancing exophytic mass of the LEFT kidney which measures 11 mm (series 5, image 36). Multiple renal cysts. Nonobstructive RIGHT-sided nephrolithiasis. Bladder is distended. Stomach/Bowel: There is large rectal stool ball with adjacent mild rectal wall thickening and fat stranding. Extensive diverticulosis. Status post RIGHT colonic surgery. No evidence of appendicitis. There is moderate colonic stool burden predominately within the LEFT hemicolon. Vascular/Lymphatic: Atherosclerotic calcifications of the aorta. Circumaortic LEFT renal vein Reproductive: Status post hysterectomy. Other: Increase in size of a low-density masslike area in the RIGHT inguinal canal. It measures 33 x 27 mm, previously 14 x 14 mm (series 2, image 77). There is a nonobstructive filling defect within the inferior vena cava extending into the RIGHT common iliac vein  consistent with thrombus. Musculoskeletal: Transitional anatomy with bilateral assimilation joints at L5-S1. IMPRESSION: 1. Moderate to large stool ball with adjacent bowel wall thickening and fat stranding. 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 2. There is a nonobstructive thrombus within the IVC extending into the RIGHT common iliac vein. 3. There is a 11 mm enhancing mass of the LEFT kidney which is concerning for renal cell carcinoma. Recommend dedicated evaluation with renal cell carcinoma protocol CT or MRI when clinically appropriate. 4. Cystic density within the RIGHT inguinal canal may reflect loculated hydrocele (favored). Additional differential considerations include a necrotic lymph node. Recommend correlation physical exam and ultrasound as deemed necessary These results were called by telephone at the time of interpretation on 01/26/2021 at 8:36 pm to provider [facility] , who verbally acknowledged these results. Electronically Signed   By: Valentino Saxon M.D.   On: 01/26/2021 20:42    Procedures Procedures   Medications Ordered in ED Medications  iohexol (OMNIPAQUE) 350 MG/ML injection 100 mL (100 mLs Intravenous Contrast Given 01/26/21 2014)  enoxaparin (LOVENOX) injection 70 mg (70 mg Subcutaneous Given 01/26/21 2312)    ED Course  I have reviewed the triage vital signs and the nursing notes.  Pertinent labs & imaging results that were available during my care of the patient were reviewed by me and considered in my medical decision making (see chart for details).  Clinical Course as of 01/26/21 2350  Sat Jan 26, 2021  2348 I discussed the case with Dr. Myna Hidalgo. [AW]    Clinical Course User Index [AW] Arnaldo Natal, MD   MDM Rules/Calculators/A&P                           Quinn Plowman presents with constipation and a mass of the right inguinal area. She has recently finished chemotherapy for colon cancer. 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 She will be admitted to the hospital for monitoring given the extensive thrombus.  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

## 2021-01-26 NOTE — ED Notes (Signed)
Pt placed on monitor.  

## 2021-01-27 ENCOUNTER — Encounter (HOSPITAL_COMMUNITY): Payer: Self-pay | Admitting: Family Medicine

## 2021-01-27 ENCOUNTER — Observation Stay (HOSPITAL_COMMUNITY): Payer: Medicare PPO

## 2021-01-27 DIAGNOSIS — C189 Malignant neoplasm of colon, unspecified: Secondary | ICD-10-CM | POA: Diagnosis present

## 2021-01-27 DIAGNOSIS — G620 Drug-induced polyneuropathy: Secondary | ICD-10-CM | POA: Diagnosis not present

## 2021-01-27 DIAGNOSIS — Z85038 Personal history of other malignant neoplasm of large intestine: Secondary | ICD-10-CM | POA: Diagnosis not present

## 2021-01-27 DIAGNOSIS — J439 Emphysema, unspecified: Secondary | ICD-10-CM | POA: Diagnosis not present

## 2021-01-27 DIAGNOSIS — J449 Chronic obstructive pulmonary disease, unspecified: Secondary | ICD-10-CM | POA: Diagnosis not present

## 2021-01-27 DIAGNOSIS — K5289 Other specified noninfective gastroenteritis and colitis: Secondary | ICD-10-CM | POA: Diagnosis not present

## 2021-01-27 DIAGNOSIS — K5900 Constipation, unspecified: Secondary | ICD-10-CM | POA: Diagnosis present

## 2021-01-27 DIAGNOSIS — D49511 Neoplasm of unspecified behavior of right kidney: Secondary | ICD-10-CM | POA: Diagnosis not present

## 2021-01-27 DIAGNOSIS — I82220 Acute embolism and thrombosis of inferior vena cava: Secondary | ICD-10-CM

## 2021-01-27 DIAGNOSIS — C641 Malignant neoplasm of right kidney, except renal pelvis: Secondary | ICD-10-CM | POA: Diagnosis not present

## 2021-01-27 DIAGNOSIS — Z87891 Personal history of nicotine dependence: Secondary | ICD-10-CM | POA: Diagnosis not present

## 2021-01-27 DIAGNOSIS — N433 Hydrocele, unspecified: Secondary | ICD-10-CM | POA: Diagnosis present

## 2021-01-27 DIAGNOSIS — T451X5A Adverse effect of antineoplastic and immunosuppressive drugs, initial encounter: Secondary | ICD-10-CM

## 2021-01-27 DIAGNOSIS — K5641 Fecal impaction: Secondary | ICD-10-CM | POA: Diagnosis not present

## 2021-01-27 DIAGNOSIS — Z20822 Contact with and (suspected) exposure to covid-19: Secondary | ICD-10-CM | POA: Diagnosis not present

## 2021-01-27 DIAGNOSIS — Z79899 Other long term (current) drug therapy: Secondary | ICD-10-CM | POA: Diagnosis not present

## 2021-01-27 LAB — RESP PANEL BY RT-PCR (FLU A&B, COVID) ARPGX2
Influenza A by PCR: NEGATIVE
Influenza B by PCR: NEGATIVE
SARS Coronavirus 2 by RT PCR: NEGATIVE

## 2021-01-27 LAB — COMPREHENSIVE METABOLIC PANEL
ALT: 14 U/L (ref 0–44)
AST: 22 U/L (ref 15–41)
Albumin: 3.1 g/dL — ABNORMAL LOW (ref 3.5–5.0)
Alkaline Phosphatase: 85 U/L (ref 38–126)
Anion gap: 6 (ref 5–15)
BUN: 15 mg/dL (ref 8–23)
CO2: 22 mmol/L (ref 22–32)
Calcium: 8.7 mg/dL — ABNORMAL LOW (ref 8.9–10.3)
Chloride: 109 mmol/L (ref 98–111)
Creatinine, Ser: 0.74 mg/dL (ref 0.44–1.00)
GFR, Estimated: >60 mL/min (ref >60)
Glucose, Bld: 117 mg/dL — ABNORMAL HIGH (ref 70–99)
Potassium: 4 mmol/L (ref 3.5–5.1)
Sodium: 137 mmol/L (ref 135–145)
Total Bilirubin: 1.3 mg/dL — ABNORMAL HIGH (ref 0.3–1.2)
Total Protein: 6 g/dL — ABNORMAL LOW (ref 6.5–8.1)

## 2021-01-27 LAB — CBC WITH DIFFERENTIAL/PLATELET
Abs Immature Granulocytes: 0.06 10*3/uL (ref 0.00–0.07)
Basophils Absolute: 0 10*3/uL (ref 0.0–0.1)
Basophils Relative: 0 %
Eosinophils Absolute: 0.1 10*3/uL (ref 0.0–0.5)
Eosinophils Relative: 0 %
HCT: 39.5 % (ref 36.0–46.0)
Hemoglobin: 13.2 g/dL (ref 12.0–15.0)
Immature Granulocytes: 1 %
Lymphocytes Relative: 17 %
Lymphs Abs: 2 10*3/uL (ref 0.7–4.0)
MCH: 31.5 pg (ref 26.0–34.0)
MCHC: 33.4 g/dL (ref 30.0–36.0)
MCV: 94.3 fL (ref 80.0–100.0)
Monocytes Absolute: 1.2 10*3/uL — ABNORMAL HIGH (ref 0.1–1.0)
Monocytes Relative: 10 %
Neutro Abs: 8.2 10*3/uL — ABNORMAL HIGH (ref 1.7–7.7)
Neutrophils Relative %: 72 %
Platelets: 139 10*3/uL — ABNORMAL LOW (ref 150–400)
RBC: 4.19 MIL/uL (ref 3.87–5.11)
RDW: 14.6 % (ref 11.5–15.5)
WBC: 11.5 10*3/uL — ABNORMAL HIGH (ref 4.0–10.5)
nRBC: 0 % (ref 0.0–0.2)

## 2021-01-27 LAB — URINALYSIS, ROUTINE W REFLEX MICROSCOPIC
Bilirubin Urine: NEGATIVE
Glucose, UA: NEGATIVE mg/dL
Hgb urine dipstick: NEGATIVE
Ketones, ur: NEGATIVE mg/dL
Leukocytes,Ua: NEGATIVE
Nitrite: NEGATIVE
Protein, ur: NEGATIVE mg/dL
Specific Gravity, Urine: 1.015 (ref 1.005–1.030)
pH: 7 (ref 5.0–8.0)

## 2021-01-27 LAB — PROTIME-INR
INR: 1.1 (ref 0.8–1.2)
Prothrombin Time: 14.1 seconds (ref 11.4–15.2)

## 2021-01-27 LAB — APTT: aPTT: 34 seconds (ref 24–36)

## 2021-01-27 IMAGING — MR MR ABDOMEN WO/W CM
18 series · 48 of 48 positions shown · IV contrast (Gadavist)
Comparison: CT AP [DATE]

CLINICAL DATA: Evaluate kidney lesion

EXAM:
MRI ABDOMEN WITHOUT AND WITH CONTRAST
TECHNIQUE: Multiplanar multisequence MR imaging of the abdomen was performed
both before and after the administration of intravenous contrast.
CONTRAST:  7mL GADAVIST GADOBUTROL 1 MMOL/ML IV SOLN

[Series 4: cor haste · coronal · 6.0mm · 1.19mm/px · 2 of 30 slices shown]
[im 1/30]
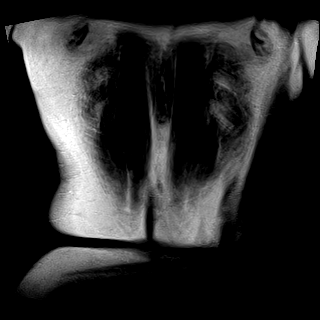
[im 30/30]
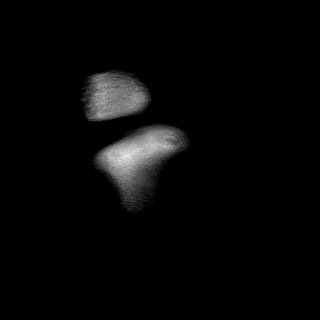

[Series 5: ax haste · axial · 6.0mm · 1.19mm/px · z∈[-50,+159]mm · 2 of 30 slices shown]
[im 1/30]
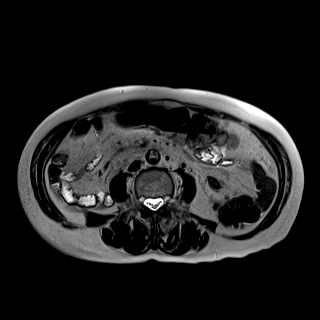
[im 30/30]
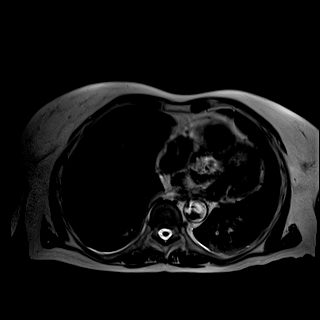

[Series 8: T2 fat-sat · axial · 6.0mm · 1.19mm/px · 1 of 30 slices shown]
[im 1/30]
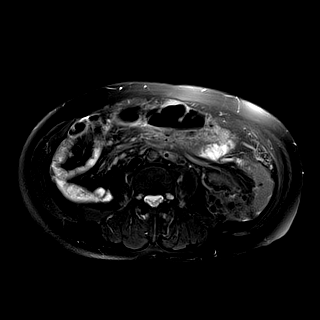

[Series 9: t1_vibe_opp-in_tra_p4_bh · axial · 3.0mm · 1.19mm/px · z∈[-61,+176]mm · 3 of 80 slices shown (1 of 2)]
[im 1/80]
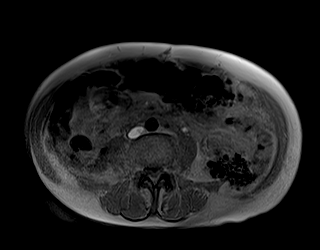
[im 40/80]
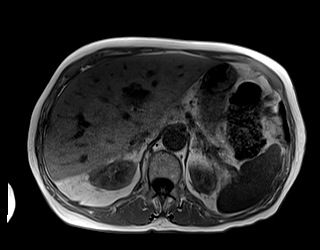
[im 80/80]
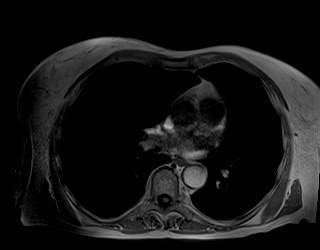

[Series 9: t1_vibe_opp-in_tra_p4_bh · axial · 3.0mm · 1.19mm/px · z∈[-61,+176]mm · 3 of 80 slices shown (2 of 2)]
[im 1/80]
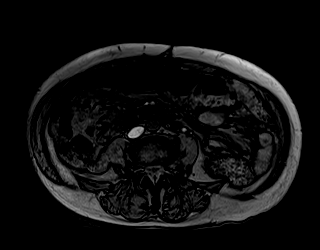
[im 40/80]
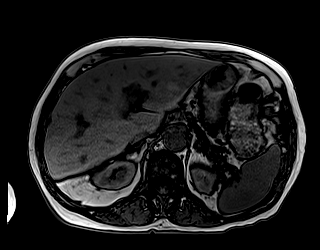
[im 80/80]
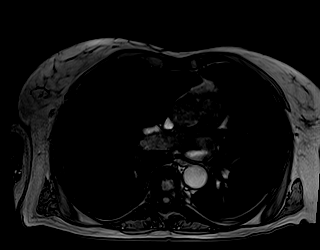

[Series 10: bSSFP · axial · 6.0mm · 0.74mm/px · z∈[-83,+198]mm · 2 of 40 slices shown]
[im 1/40]
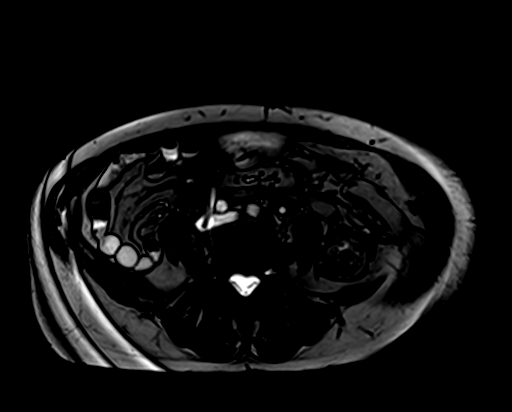
[im 40/40]
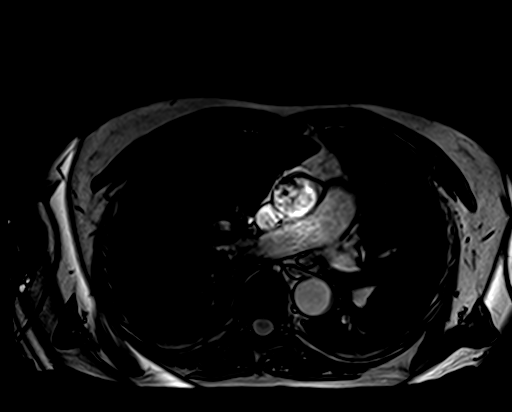

[Series 11: DWI · axial · 6.0mm · 1.42mm/px · z∈[-51,+179]mm · 4 of 99 slices shown (1 of 2)]
[im 1/99]
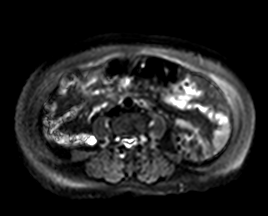
[im 33/99]
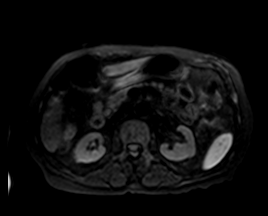
[im 66/99]
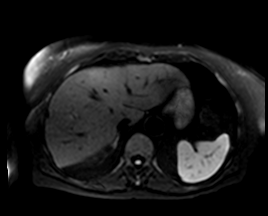
[im 99/99]
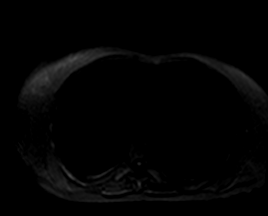

[Series 12: DWI · axial · 6.0mm · 1.42mm/px · 1 of 33 slices shown (2 of 2)]
[im 1/33]
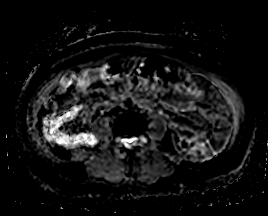

[Series 13: t1_vibe_fs_tra_p4_bh_pre · axial · 3.0mm · 1.19mm/px · z∈[-57,+180]mm · 3 of 80 slices shown]
[im 1/80]
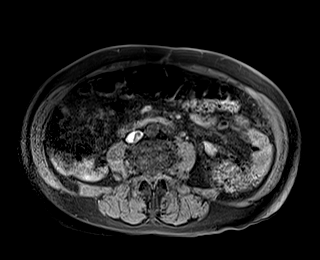
[im 40/80]
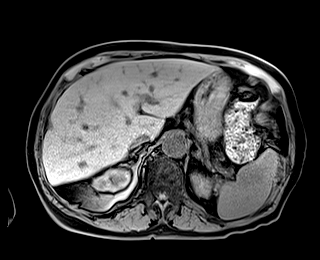
[im 80/80]
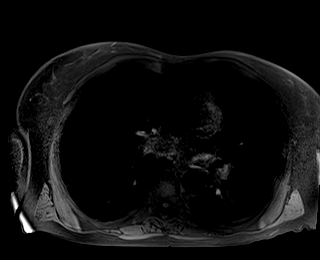

[Series 15: t1_vibe_fs_tra_p4_bh_post · axial · 3.0mm · 1.19mm/px · z∈[-57,+180]mm · 3 of 80 slices shown (1 of 4)]
[im 1/80]
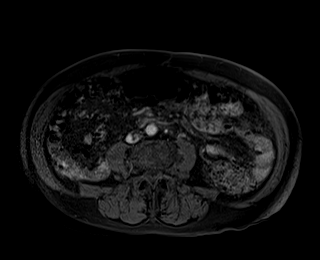
[im 40/80]
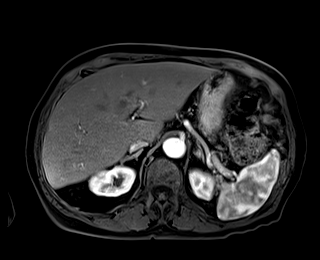
[im 80/80]
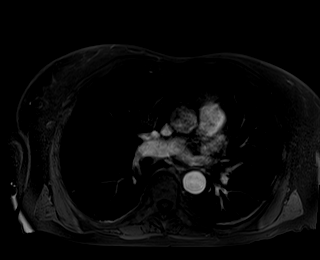

[Series 16: t1_vibe_fs_tra_p4_bh_post_sub · axial · 3.0mm · 1.19mm/px · z∈[-57,+180]mm · 3 of 80 slices shown (1 of 4)]
[im 1/80]
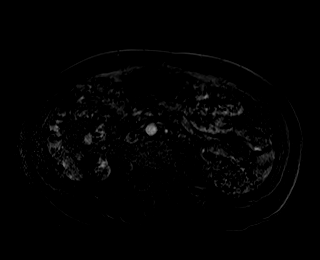
[im 40/80]
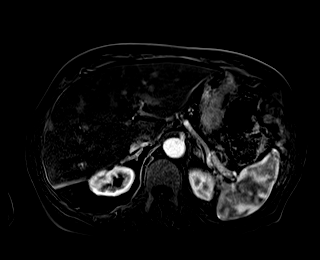
[im 80/80]
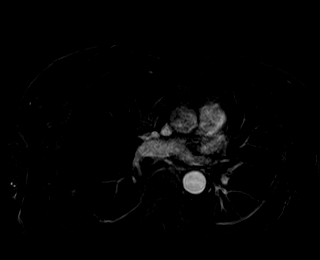

[Series 17: t1_vibe_fs_tra_p4_bh_post · axial · 3.0mm · 1.19mm/px · z∈[-57,+180]mm · 3 of 80 slices shown (2 of 4)]
[im 1/80]
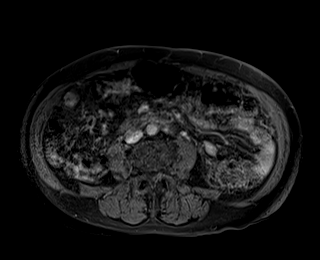
[im 40/80]
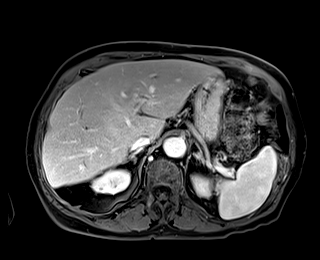
[im 80/80]
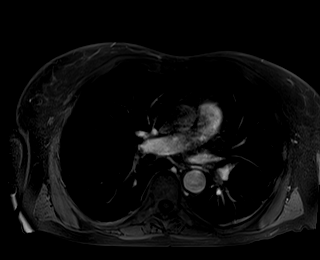

[Series 18: t1_vibe_fs_tra_p4_bh_post_sub · axial · 3.0mm · 1.19mm/px · z∈[-57,+180]mm · 3 of 80 slices shown (2 of 4)]
[im 1/80]
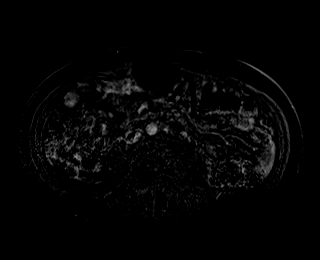
[im 40/80]
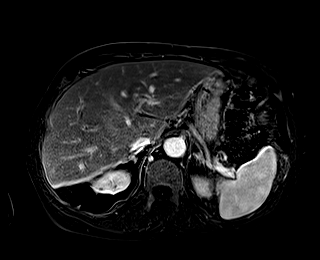
[im 80/80]
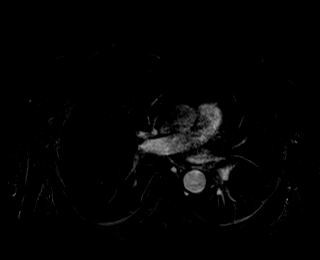

[Series 19: t1_vibe_fs_tra_p4_bh_post · axial · 3.0mm · 1.19mm/px · z∈[-57,+180]mm · 3 of 80 slices shown (3 of 4)]
[im 1/80]
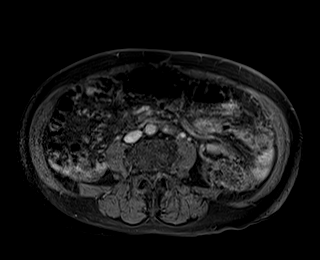
[im 40/80]
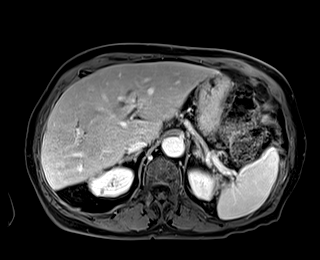
[im 80/80]
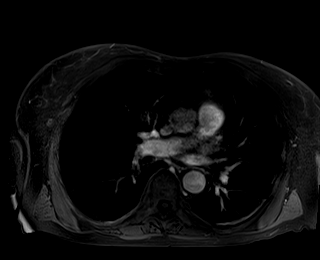

[Series 20: t1_vibe_fs_tra_p4_bh_post_sub · axial · 3.0mm · 1.19mm/px · z∈[-57,+180]mm · 3 of 80 slices shown (3 of 4)]
[im 1/80]
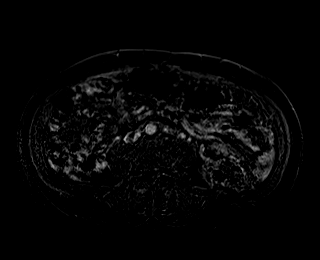
[im 40/80]
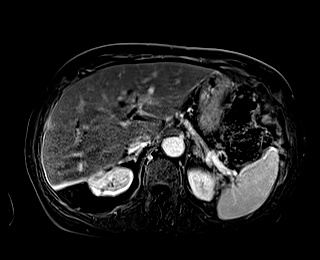
[im 80/80]
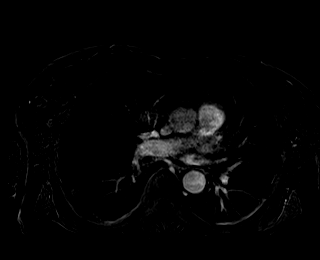

[Series 21: t1_vibe_fs_tra_p4_bh_post · axial · 3.0mm · 1.19mm/px · z∈[-57,+180]mm · 3 of 80 slices shown (4 of 4)]
[im 1/80]
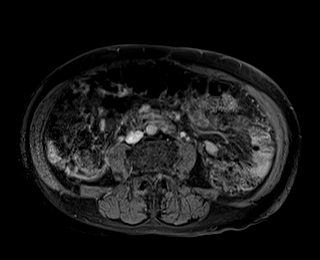
[im 40/80]
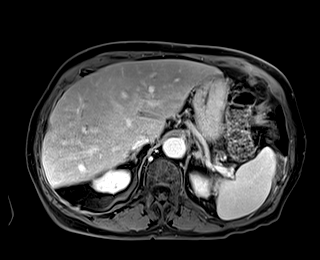
[im 80/80]
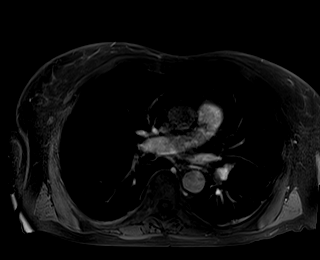

[Series 22: t1_vibe_fs_tra_p4_bh_post_sub · axial · 3.0mm · 1.19mm/px · z∈[-57,+180]mm · 3 of 80 slices shown (4 of 4)]
[im 1/80]
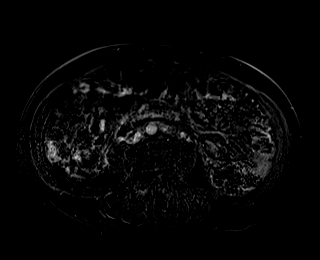
[im 40/80]
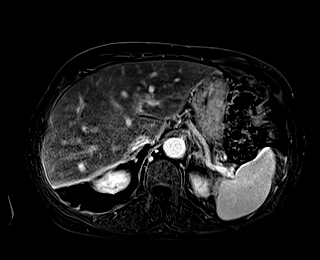
[im 80/80]
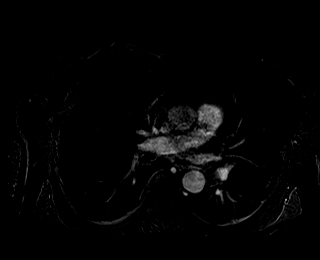

[Series 23: T1 dynamic post-contrast · coronal · 3.0mm · 1.31mm/px · 3 of 72 slices shown]
[im 1/72]
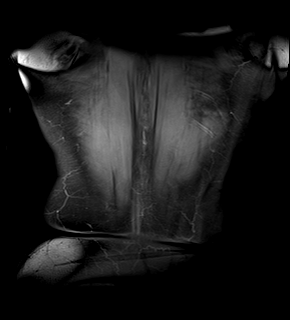
[im 36/72]
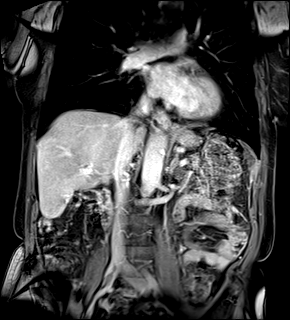
[im 72/72]
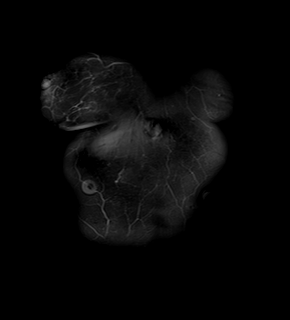

[48 of 48 positions shown; findings below may reference images not displayed]

FINDINGS: Lower chest: No acute findings.

Hepatobiliary: Small arterial phase enhancing structure within the
posterior right hepatic lobe measures 4 mm. This becomes isointense
on the delayed images and is favored to represent a benign perfusion
anomaly. No suspicious enhancing liver lesions identified.
Cholecystectomy. Increase caliber of the common bile duct measures
1.4 cm. This is unchanged when compared with [DATE]. No
choledocholithiasis identified.

Pancreas: No mass, inflammatory changes, or other parenchymal
abnormality identified.

Spleen:  Within normal limits in size and appearance.

Adrenals/Urinary Tract: Normal adrenal glands. Arising off the upper
pole of the left kidney is a solid enhancing, partially exophytic
lesion measuring 1.5 cm, image 48/7. Suspicious for renal cell
carcinoma.

Septated cyst containing layering hemorrhagic debris arising off the
posterior cortex of the interpolar left kidney measures 1.9 cm,
image 64/13.

Simple appearing cyst measures 2.9 cm in arises off the inferior
pole of the left kidney, image [DATE].

Additional small kidney lesions are noted which are technically too
small to reliably characterize.

Stomach/Bowel: Visualized portions within the abdomen are
unremarkable.

Vascular/Lymphatic: Aortic atherosclerosis. No aneurysm. No
abdominal adenopathy.

Other:  No free fluid or fluid collections identified.

Musculoskeletal: No suspicious bone lesions identified.
IMPRESSION: 1. Arising off the upper pole of the left kidney is a solid
enhancing, partially exophytic lesion measuring 1.5 cm. Suspicious
for renal cell carcinoma. No signs to suggest nodal or solid organ
metastasis.
2. Additional kidney lesions identified compatible with Bosniak
category 1 and 2 cysts.

## 2021-01-27 MED ORDER — MINERAL OIL RE ENEM
1.0000 | ENEMA | Freq: Every day | RECTAL | Status: DC
  Administered 2021-01-28: 1 via RECTAL
  Filled 2021-01-27 (×2): qty 1

## 2021-01-27 MED ORDER — ONDANSETRON HCL 4 MG PO TABS: 4.0000 mg | ORAL_TABLET | Freq: Four times a day (QID) | ORAL | Status: DC | PRN

## 2021-01-27 MED ORDER — ONDANSETRON HCL 4 MG/2ML IJ SOLN: 4.0000 mg | Freq: Four times a day (QID) | INTRAMUSCULAR | Status: DC | PRN

## 2021-01-27 MED ORDER — ALBUTEROL SULFATE (2.5 MG/3ML) 0.083% IN NEBU: 2.5000 mg | INHALATION_SOLUTION | RESPIRATORY_TRACT | Status: DC | PRN

## 2021-01-27 MED ORDER — GADOBUTROL 1 MMOL/ML IV SOLN
7.0000 mL | Freq: Once | INTRAVENOUS | Status: AC | PRN
  Administered 2021-01-27: 7 mL via INTRAVENOUS

## 2021-01-27 MED ORDER — GABAPENTIN 300 MG PO CAPS
300.0000 mg | ORAL_CAPSULE | Freq: Every day | ORAL | Status: DC
  Administered 2021-01-27: 300 mg via ORAL
  Filled 2021-01-27: qty 1

## 2021-01-27 MED ORDER — PANTOPRAZOLE SODIUM 40 MG PO TBEC
40.0000 mg | DELAYED_RELEASE_TABLET | Freq: Every day | ORAL | Status: DC
  Administered 2021-01-27: 40 mg via ORAL
  Filled 2021-01-27 (×2): qty 1

## 2021-01-27 MED ORDER — ENOXAPARIN SODIUM 80 MG/0.8ML IJ SOSY: 1.0000 mg/kg | PREFILLED_SYRINGE | Freq: Two times a day (BID) | INTRAMUSCULAR | Status: DC

## 2021-01-27 MED ORDER — SENNOSIDES-DOCUSATE SODIUM 8.6-50 MG PO TABS
1.0000 | ORAL_TABLET | Freq: Two times a day (BID) | ORAL | Status: DC
  Administered 2021-01-27 – 2021-01-28 (×3): 1 via ORAL
  Filled 2021-01-27 (×3): qty 1

## 2021-01-27 MED ORDER — ENOXAPARIN SODIUM 80 MG/0.8ML IJ SOSY
70.0000 mg | PREFILLED_SYRINGE | Freq: Two times a day (BID) | INTRAMUSCULAR | Status: DC
  Administered 2021-01-27 – 2021-01-28 (×3): 70 mg via SUBCUTANEOUS
  Filled 2021-01-27 (×3): qty 0.8

## 2021-01-27 MED ORDER — ACETAMINOPHEN 650 MG RE SUPP: 650.0000 mg | Freq: Four times a day (QID) | RECTAL | Status: DC | PRN

## 2021-01-27 MED ORDER — MINERAL OIL RE ENEM
1.0000 | ENEMA | RECTAL | Status: DC
Start: 2021-01-27 — End: 2021-01-27
  Administered 2021-01-27: 1 via RECTAL
  Filled 2021-01-27 (×2): qty 1

## 2021-01-27 MED ORDER — ACETAMINOPHEN 325 MG PO TABS: 650.0000 mg | ORAL_TABLET | Freq: Four times a day (QID) | ORAL | Status: DC | PRN

## 2021-01-27 MED ORDER — LACTATED RINGERS IV SOLN: INTRAVENOUS | Status: DC

## 2021-01-27 MED ORDER — POLYETHYLENE GLYCOL 3350 17 G PO PACK
17.0000 g | PACK | Freq: Two times a day (BID) | ORAL | Status: AC
  Administered 2021-01-27 (×2): 17 g via ORAL
  Filled 2021-01-27 (×2): qty 1

## 2021-01-27 MED ORDER — HYDROCODONE-ACETAMINOPHEN 5-325 MG PO TABS: 1.0000 | ORAL_TABLET | Freq: Four times a day (QID) | ORAL | Status: DC | PRN

## 2021-01-27 NOTE — Progress Notes (Signed)
New Admission Note:   Arrival Method: PTAR stretcher Mental Orientation: alert x 4 Telemetry: 18 Assessment: Completed Skin: see flow sheet IV: NSL Pain: none Tubes: none Safety Measures: Safety Fall Prevention Plan has been discussed Admission: Completed [facility] Orientation: Patient has been orientated to the room, unit and staff.  Family: none at bedside  Orders have been reviewed and implemented. Will continue to monitor the patient. Call light has been placed within reach and bed alarm has been activated.   Rockie Neighbours BSN, RN Phone number: [phone]

## 2021-01-27 NOTE — H&P (Signed)
History and Physical    Kelly [name] [id number] DOB: Jun 20, 1945 DOA: 01/26/2021  PCP: Katherina Mires, MD  Patient coming from: Home via [place]   Chief Complaint:  Chief Complaint  Patient presents with   Constipation     HPI:    75 year old female with past medical history of COPD, adenocarcinoma of the colon stage IIIb (Dx 05/2020, Colon resection 06/2020, FOLFOX completion 12/25/2020) and chemotherapy-induced polyneuropathy who presents to [facility] emergency department with a several day history of constipation and rectal pressure.  Last week of August, patient suddenly noticed that she began to palpate a soft mass in her right inguinal area.  She saw her primary care provider concerning on 8/26 and during that visit there was discussion about having a repeat CT imaging of the abdomen and pelvis after coordination with patient's oncologist at that time however this was never done.  In the following that, patient noticed that she began to experience intermittent constipation.  Patient explains that at baseline she typically moves her bowels on a daily basis.  Patient states that approximately 3 or 4 days ago she noticed that she was having dramatically worsened difficulty moving her bowels.  In the days that followed patient's constipation worsened.  Patient began to experience lower abdominal and rectal "pressure."  Patient denies actual pain however.  Patient denies fever, nausea, vomiting, change in appetite, weakness, recent travel, sick contacts or contact with confirmed COVID-19 infection.  Patient denies any changes in dietary habits or any new medications as of late.  Patient symptoms of lower abdominal and rectal pressure continue to persist until she eventually presented to [facility] emergency department for evaluation.  Upon evaluation at [facility] emergency department patient underwent CT imaging of the abdomen and pelvis  revealing a moderate to large stool ball with surrounding bowel wall thickening and fat stranding concerning for stercoral colitis.  CT imaging also incidentally found an 11 mm enhancing mass of the left kidney concerning for renal cell carcinoma as well as a nonobstructive thrombus within the IVC extending into the right common iliac vein.  1 mg/kg of Lovenox was administered.  ER provider attempted a manual disimpaction which was moderately successful.  Hospitalist group was then contacted and and patient was accepted for transfer to [facility] for continued medical care.  Review of Systems:   Review of Systems  Constitutional:  Positive for malaise/fatigue.  Gastrointestinal:  Positive for constipation.  Neurological:  Positive for weakness.  All other systems reviewed and are negative.  Past Medical History:  Diagnosis Date   Acute gallstone pancreatitis s/p lap cholecystectomy 10/26/2015 10/24/2015   Choledocholithiasis 10/24/2015   Colon cancer [facility])    with surgery in Jan. 2022   COPD (chronic obstructive pulmonary disease) ([place])     Past Surgical History:  Procedure Laterality Date   ABDOMINAL HYSTERECTOMY  06/2010   bladder tack     CHOLECYSTECTOMY N/A 10/26/2015   Procedure: LAPAROSCOPIC CHOLECYSTECTOMY WITH INTRAOPERATIVE CHOLANGIOGRAM;  Surgeon: Michael Boston, MD;  Location: [facility];  Service: General;  Laterality: N/A;   DILATION AND CURETTAGE OF UTERUS  06/2010   ERCP N/A 10/25/2015   Procedure: ENDOSCOPIC RETROGRADE CHOLANGIOPANCREATOGRAPHY (ERCP);  Surgeon: Milus Banister, MD;  Location: [facility];  Service: Endoscopy;  Laterality: N/A;   HERNIA REPAIR     inguinal hernia repair - at 75 years old   TONSILLECTOMY       reports that  she quit smoking about 11 years ago. Her smoking use included cigarettes. She has never used smokeless tobacco. She reports that she does not currently use alcohol after a past usage of about 1.0 standard drink per week. She reports that  she does not use drugs.  Allergies  Allergen Reactions   Sulfa Antibiotics Rash    Family History  Problem Relation Age of Onset   Heart disease Mother    Dementia Father      Prior to Admission medications   Medication Sig Start Date End Date Taking? Authorizing Provider  gabapentin (NEURONTIN) 300 MG capsule Take 300 mg by mouth at bedtime. 01/09/21  Yes [provider]  Biotin 5 MG TABS Take 5 mg by mouth daily.    [provider]  cephALEXin (KEFLEX) 500 MG capsule Take 1 capsule (500 mg total) by mouth 3 (three) times daily. 08/17/20   Veryl Speak, MD  HYDROcodone-acetaminophen (NORCO) 5-325 MG tablet Take 1-2 tablets by mouth every 6 (six) hours as needed. 08/17/20   Veryl Speak, MD  hydrocortisone (ANUSOL-HC) 2.5 % rectal cream Place 1 application rectally 2 (two) times daily. 01/17/21   [provider]  KLOR-CON M20 20 MEQ tablet Take 20 mEq by mouth 2 (two) times daily. 12/25/20   [provider]  omeprazole (PRILOSEC) 40 MG capsule Take 40 mg by mouth daily. 12/18/20   [provider]    Physical Exam: Vitals:   01/27/21 0305 01/27/21 0336 01/27/21 0355 01/27/21 0515  BP:   (!) 168/80 117/71  Pulse: 75 73 73 76  Resp: '17 20 18 16  '$ Temp:   99.2 F (37.3 C) 98.4 F (36.9 C)  TempSrc:   Oral Oral  SpO2: 94% 96% 95% 92%  Weight:      Height:        Constitutional: Awake alert and oriented x3, no associated distress.   Skin: no rashes, no lesions, somewhat poor skin turgor noted. Eyes: Pupils are equally reactive to light.  No evidence of scleral icterus or conjunctival pallor.  ENMT: Slightly dry mucous membranes noted.  Posterior pharynx clear of any exudate or lesions.   Neck: normal, supple, no masses, no thyromegaly.  No evidence of jugular venous distension.   Respiratory: clear to auscultation bilaterally, no wheezing, no crackles. Normal respiratory effort. No accessory muscle use.  Cardiovascular: Regular rate and  rhythm, no murmurs / rubs / gallops. No extremity edema. 2+ pedal pulses. No carotid bruits.  Chest:   Nontender without crepitus or deformity.   Back:   Nontender without crepitus or deformity. Abdomen: Some lower abdominal tenderness noted with vague lower abdominal fullness.   Abdomen is soft.  Positive bowel sounds noted in all quadrants.   Musculoskeletal: No joint deformity upper and lower extremities. Good ROM, no contractures. Normal muscle tone.  Neurologic: CN 2-12 grossly intact. Sensation intact.  Patient moving all 4 extremities spontaneously.  Patient is following all commands.  Patient is responsive to verbal stimuli.   Psychiatric: Patient exhibits normal mood with appropriate affect.  Patient seems to possess insight as to their current situation.     Labs on Admission: I have personally reviewed following labs and imaging studies -   CBC: Recent Labs  Lab 01/26/21 1925 01/27/21 0638  WBC 11.4* 11.5*  NEUTROABS 8.6* 8.2*  HGB 13.6 13.2  HCT 41.2 39.5  MCV 96.3 94.3  PLT 147* [id number]*   Basic Metabolic Panel: Recent Labs  Lab 01/26/21 1925 01/27/21 0638  NA  139 137  K 4.1 4.0  CL 106 109  CO2 24 22  GLUCOSE 109* 117*  BUN 22 15  CREATININE 0.82 0.74  CALCIUM 8.7* 8.7*   GFR: Estimated Creatinine Clearance: 65.5 mL/min (by C-G formula based on SCr of 0.74 mg/dL). Liver Function Tests: Recent Labs  Lab 01/26/21 1925 01/27/21 0638  AST 22 22  ALT 14 14  ALKPHOS 85 85  BILITOT 0.7 1.3*  PROT 6.7 6.0*  ALBUMIN 3.6 3.1*   Recent Labs  Lab 01/26/21 1925  LIPASE 23   No results for input(s): AMMONIA in the last 168 hours. Coagulation Profile: Recent Labs  Lab 01/27/21 0638  INR 1.1   Cardiac Enzymes: No results for input(s): CKTOTAL, CKMB, CKMBINDEX, TROPONINI in the last 168 hours. BNP (last 3 results) No results for input(s): PROBNP in the last 8760 hours. HbA1C: No results for input(s): HGBA1C in the last 72 hours. CBG: No results for  input(s): GLUCAP in the last 168 hours. Lipid Profile: No results for input(s): CHOL, HDL, LDLCALC, TRIG, CHOLHDL, LDLDIRECT in the last 72 hours. Thyroid Function Tests: No results for input(s): TSH, T4TOTAL, FREET4, T3FREE, THYROIDAB in the last 72 hours. Anemia Panel: No results for input(s): VITAMINB12, FOLATE, FERRITIN, TIBC, IRON, RETICCTPCT in the last 72 hours. Urine analysis:    Component Value Date/Time   COLORURINE STRAW (A) 01/27/2021 0044   APPEARANCEUR HAZY (A) 01/27/2021 0044   LABSPEC 1.015 01/27/2021 0044   PHURINE 7.0 01/27/2021 0044   GLUCOSEU NEGATIVE 01/27/2021 0044   HGBUR NEGATIVE 01/27/2021 0044   BILIRUBINUR NEGATIVE 01/27/2021 0044   KETONESUR NEGATIVE 01/27/2021 0044   PROTEINUR NEGATIVE 01/27/2021 0044   NITRITE NEGATIVE 01/27/2021 0044   LEUKOCYTESUR NEGATIVE 01/27/2021 0044    Radiological Exams on Admission - Personally Reviewed: CT Abdomen Pelvis W Contrast  Result Date: 01/26/2021 CLINICAL DATA:  Bowel obstruction suspected EXAM: CT ABDOMEN AND PELVIS WITH CONTRAST TECHNIQUE: Multidetector CT imaging of the abdomen and pelvis was performed using the standard protocol following bolus administration of intravenous contrast. CONTRAST:  192m OMNIPAQUE IOHEXOL 350 MG/ML SOLN COMPARISON:  August 17, 2020 FINDINGS: Lower chest: LEFT basilar consolidative opacity, improved since prior and likely reflecting atelectasis. Improvement of RIGHT basilar consolidative opacity with minimal residual linear opacity consistent with atelectasis/scar. Hepatobiliary: Status post cholecystectomy. There is dilation of the common bile duct proximally 15 mm, previously 17 mm. No focal hepatic lesion identified. Pancreas: Unremarkable. No pancreatic ductal dilatation or surrounding inflammatory changes. Spleen: Splenomegaly, mild. Adrenals/Urinary Tract: Adrenal glands are unremarkable. There is an enhancing exophytic mass of the LEFT kidney which measures 11 mm (series 5, image 36).  Multiple renal cysts. Nonobstructive RIGHT-sided nephrolithiasis. Bladder is distended. Stomach/Bowel: There is large rectal stool ball with adjacent mild rectal wall thickening and fat stranding. Extensive diverticulosis. Status post RIGHT colonic surgery. No evidence of appendicitis. There is moderate colonic stool burden predominately within the LEFT hemicolon. Vascular/Lymphatic: Atherosclerotic calcifications of the aorta. Circumaortic LEFT renal vein Reproductive: Status post hysterectomy. Other: Increase in size of a low-density masslike area in the RIGHT inguinal canal. It measures 33 x 27 mm, previously 14 x 14 mm (series 2, image 77). There is a nonobstructive filling defect within the inferior vena cava extending into the RIGHT common iliac vein consistent with thrombus. Musculoskeletal: Transitional anatomy with bilateral assimilation joints at L5-S1. IMPRESSION: 1. Moderate to large stool ball with adjacent bowel wall thickening and fat stranding. 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 2. There is a nonobstructive thrombus within the  IVC extending into the RIGHT common iliac vein. 3. There is a 11 mm enhancing mass of the LEFT kidney which is concerning for renal cell carcinoma. Recommend dedicated evaluation with renal cell carcinoma protocol CT or MRI when clinically appropriate. 4. Cystic density within the RIGHT inguinal canal may reflect loculated hydrocele (favored). Additional differential considerations include a necrotic lymph node. Recommend correlation physical exam and ultrasound as deemed necessary These results were called by telephone at the time of interpretation on 01/26/2021 at 8:36 pm to provider [facility] , who verbally acknowledged these results. Electronically Signed   By: Valentino Saxon M.D.   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 [facility])  Patient presented for symptoms of constipation but instead was  incidentally found to have a 11 mm enhancing mass of the left kidney concerning for renal cell carcinoma with even more concerning nonocclusive IVC thrombus extending into the right common iliac vein These 2 findings are likely related as renal cell carcinomas can typically precipitate inferior vena cava thromboembolism No clinical evidence to indicate concurrent pulmonary embolism Patient has already been initiated on subcutaneous Lovenox which will be continued at this time Monitoring patient closely on telemetry I have discussed the finding of the inferior vena cava nonocclusive thrombus with Dr. Carlis Abbott with vascular surgery.  He recommends fully evaluating the renal cell carcinoma followed by proceeding with having urology perform surgical intervention (possible nephrectomy).  He feels that the best approach is that at the time of nephrectomy urology can remove the IVC thrombus simultaneously, which can be coordinated with vascular surgery if urology wishes. He recommends against attempting an IVC filter at this time. Obtaining MRI abdomen per radiology recommendation.  Once this confirms RCC we will formally consult urology for further input and options for inpatient/outpatient intervention  Active Problems:   COPD (chronic obstructive pulmonary disease) ([place])  No clinical evidence of COPD exacerbation As needed bronchodilator therapy for shortness of breath and wheezing    Stercoral colitis  Substantial stool ball with surrounding bowel wall thickening and stranding concerning for stercoral colitis Etiology of sudden development of stercoral colitis is unclear as patient has already completed her treatment for adenocarcinoma of the colon Attempted disimpaction at [place] emergency department by the ER provider Not provided patient with a combination of oral MiraLAX plus frequent mineral oil enemas for effective evacuation of the stool burden Afterwards, patient may need repeat  endoscopic evaluation of the colon to ensure that there is no underlying recurrent malignancy.    Adenocarcinoma of colon [facility])  Identified 05/2020 Patient underwent resection of colon 06/2020 Patient initiated FOLFOX chemotherapy regimen 07/2020 which was just completed 12/25/2020 Patient follows with [facility] hematology/oncology As mentioned above patient may need further evaluation to determine if there is any underlying recurrent colonic malignancy considering severe constipation    Chemotherapy-induced neuropathy ([place])  Continue home regimen of gabapentin    Code Status:  Full code Family Communication:  Deferred  Status is: Observation  The patient remains OBS appropriate and will d/c before 2 midnights.  Dispo: The patient is from: Home              Anticipated d/c is to: Home              Patient currently is not medically stable to d/c.   Difficult to place patient No        Vernelle Emerald MD  [facility] Pager [phone]  If 7PM-7AM, please contact night-coverage [email or web address] Use universal [place] password for that web site. If you do not have the password, please call the hospital operator.  01/27/2021, 9:14 AM

## 2021-01-27 NOTE — Progress Notes (Signed)
PROGRESS NOTE  Kelly [name]  DOB: 1945/12/04  PCP: Katherina Mires, MD [id number]  DOA: 01/26/2021  LOS: 0 days  Hospital Day: 2   Chief Complaint  Patient presents with   Constipation   Brief narrative: Kelly [name] is a 75 y.o. female with PMH significant for COPD, adenocarcinoma of the colon stage IIIb (Dx 05/2020, Colon resection 06/2020, FOLFOX completion 12/25/2020) and chemotherapy-induced polyneuropathy. Patient presented to the ED at [facility] on 8/27 with several day history of constipation and a palpable mass on right inguinal area. Typically moves bowel every day but has not had BM this time for several days followed by rectal pressure.  In the ED, patient was afebrile, hemodynamically stable, breathing on room air Labs with WC count 11.4, sodium 139, potassium 4.1, BUN/creatinine 22/0.82, liver enzymes normal CT abdomen pelvis showed  moderate to large stool ball with adjacent bowel wall thickening and fat stranding, likely suggestive of stercoral colitis.  1.1 cm enhancing mass in the left kidney concerning for renal cell carcinoma Nonobstructive thrombus within the IVC extending into the RIGHT common iliac vein. Cystic density within the RIGHT inguinal canal suspicious for loculated hydrocele versus a necrotic lymph node.    ED physician tried manual disimpaction which is moderately successful.  Patient was admitted to hospital service for further evaluation management.  Subjective: Patient was seen and examined this am.  Lying on bed.  Not in distress.  She just had a good bowel movement and is in a pleasant mood.  Daughters at bedside. Went down for MRI this morning.  Pending report.   Assessment/Plan: Tumor of right kidney with IVC thrombus -1.1 cm enhancing mass in the left kidney radiologically suggestive of renal cell carcinoma. -MRI abdomen was ordered for better understanding.  Pending report.  We will get urology consultation after  that. -Currently not having any hematuria or pain.  IVC thrombus -Nonobstructive thrombus within the IVC extending from the right common iliac vein, probably related to underlying renal cell cancer. -Currently on full dose Lovenox.  Acute constipation Stercoral colitis -Several days of constipation with hard stool ball in rectum.  Manual disimpaction tried in ED with moderate success. -She had successful bowel movement this morning. -Continue bowel regimen with Senokot twice daily, MiraLAX as needed enema.  Adenocarcinoma of the colon stage IIIb -Dx 05/2020, Colon resection 06/2020, FOLFOX completion 12/25/2020 -Follows with oncologist Dr. Basilia Jumbo at [place] [phone])  -We will get in touch with her tomorrow.  Chemotherapy-induced neuropathy -Continue Neurontin.   Mobility: Encourage ambulation Code Status:   Code Status: Full Code  Nutritional status: Body mass index is 23.33 kg/m.     Diet:  Diet Order             Diet regular Room service appropriate? Yes; Fluid consistency: Thin  Diet effective now                  DVT prophylaxis: Lovenox full dose   Antimicrobials: None Fluid: No need of IV hydration at this time Consultants: Not yet Family Communication: Daughters at bedside  Status is: Observation  Remains inpatient appropriate because: Needs further work-up for probable renal cell cancer  Dispo: The patient is from: Home              Anticipated d/c is to: Home              Patient currently is not medically stable to d/c.   Difficult to place patient No  Infusions:     Scheduled Meds:  enoxaparin (LOVENOX) injection  70 mg Subcutaneous Q12H   gabapentin  300 mg Oral QHS   [START ON 01/28/2021] mineral oil  1 enema Rectal Daily   pantoprazole  40 mg Oral Daily   polyethylene glycol  17 g Oral BID   senna-docusate  1 tablet Oral BID    Antimicrobials: Anti-infectives (From admission, onward)    None       PRN  meds: acetaminophen **OR** acetaminophen, albuterol, HYDROcodone-acetaminophen, ondansetron **OR** ondansetron (ZOFRAN) IV   Objective: Vitals:   01/27/21 0515 01/27/21 0951  BP: 117/71 122/75  Pulse: 76 73  Resp: 16 18  Temp: 98.4 F (36.9 C) 97.9 F (36.6 C)  SpO2: 92% 97%   No intake or output data in the 24 hours ending 01/27/21 1147 Filed Weights   01/26/21 1520  Weight: 71.7 kg   Weight change:  Body mass index is 23.33 kg/m.   Physical Exam: General exam: Pleasant, elderly Caucasian female.  Not in distress at this time Skin: No rashes, lesions or ulcers. HEENT: Atraumatic, normocephalic, no obvious bleeding Lungs: Clear to auscultation bilaterally CVS: Regular rate and rhythm, no murmur GI/Abd soft, nontender, nondistended, bowel sound present CNS: Alert, awake, oriented x3 Psychiatry: Mood appropriate Extremities: No pedal edema, no calf tenderness  Data Review: I have personally reviewed the laboratory data and studies available.  Recent Labs  Lab 01/26/21 1925 01/27/21 0638  WBC 11.4* 11.5*  NEUTROABS 8.6* 8.2*  HGB 13.6 13.2  HCT 41.2 39.5  MCV 96.3 94.3  PLT 147* 139*   Recent Labs  Lab 01/26/21 1925 01/27/21 0638  NA 139 137  K 4.1 4.0  CL 106 109  CO2 24 22  GLUCOSE 109* 117*  BUN 22 15  CREATININE 0.82 0.74  CALCIUM 8.7* 8.7*    F/u labs ordered Unresulted Labs (From admission, onward)     Start     Ordered   01/28/21 0500  Comprehensive metabolic panel  Tomorrow morning,   R        01/27/21 0859   01/28/21 0500  CBC WITH DIFFERENTIAL  Tomorrow morning,   R        01/27/21 0859   01/28/21 0500  APTT  Tomorrow morning,   R        01/27/21 0859   01/28/21 0500  Protime-INR  Tomorrow morning,   R        01/27/21 0859            Signed, Terrilee Croak, MD [facility] 01/27/2021

## 2021-01-27 NOTE — Progress Notes (Signed)
ANTICOAGULATION CONSULT NOTE - Initial Consult  Pharmacy Consult for therapeutic enoxaparin Indication:  new IVC thrombus  Allergies  Allergen Reactions   Sulfa Antibiotics Rash    Patient Measurements: Height: '5\' 9"'$  (175.3 cm) Weight: 71.7 kg (158 lb) IBW/kg (Calculated) : 66.2  Vital Signs: Temp: 98.4 F (36.9 C) (08/28 0515) Temp Source: Oral (08/28 0515) BP: 117/71 (08/28 0515) Pulse Rate: 76 (08/28 0515)  Labs: Recent Labs    01/26/21 1925 01/27/21 0638  HGB 13.6 13.2  HCT 41.2 39.5  PLT 147* 139*  APTT  --  34  LABPROT  --  14.1  INR  --  1.1  CREATININE 0.82 0.74    Estimated Creatinine Clearance: 65.5 mL/min (by C-G formula based on SCr of 0.74 mg/dL).   Medical History: Past Medical History:  Diagnosis Date   Acute gallstone pancreatitis s/p lap cholecystectomy 10/26/2015 10/24/2015   Choledocholithiasis 10/24/2015   Colon cancer ([place])    with surgery in Jan. 2022   COPD (chronic obstructive pulmonary disease) ([facility])     Medications:  Scheduled:   enoxaparin (LOVENOX) injection  70 mg Subcutaneous Q12H   gabapentin  300 mg Oral QHS   mineral oil  1 enema Rectal Q4H   pantoprazole  40 mg Oral Daily   polyethylene glycol  17 g Oral BID   Infusions:   lactated ringers      Assessment: 8/27 CToA revealed new nonobstructive thrombus within the IVC extending into the right common iliac vein with suspicion of new renal cell carcinoma. Pharmacy asked to dose therapeutic enoxaparin to treat new IVC thrombus.  Renal function stable with Scr 0.74 mg/dL (BL < 1.0). Current weight 71.7 kg. Will dose '1mg'$ /kg Q12H and check levels once at steady state.   Goal of Therapy:  Anti-Xa level 0.6-1 units/ml 4hrs after LMWH dose given Monitor platelets by anticoagulation protocol: Yes   Plan:  - Start Lovenox '70mg'$  SQ Q12H - Monitor anti Xa levels, CBC - Monitor for bleeding   Thank you for allowing pharmacy to be a part of this patient's care.  Ardyth Harps, PharmD Clinical Pharmacist

## 2021-01-28 DIAGNOSIS — D49511 Neoplasm of unspecified behavior of right kidney: Secondary | ICD-10-CM | POA: Diagnosis not present

## 2021-01-28 DIAGNOSIS — I82220 Acute embolism and thrombosis of inferior vena cava: Secondary | ICD-10-CM | POA: Diagnosis not present

## 2021-01-28 LAB — CBC WITH DIFFERENTIAL/PLATELET
Abs Immature Granulocytes: 0.05 10*3/uL (ref 0.00–0.07)
Basophils Absolute: 0 10*3/uL (ref 0.0–0.1)
Basophils Relative: 1 %
Eosinophils Absolute: 0.1 10*3/uL (ref 0.0–0.5)
Eosinophils Relative: 1 %
HCT: 37.9 % (ref 36.0–46.0)
Hemoglobin: 12.3 g/dL (ref 12.0–15.0)
Immature Granulocytes: 1 %
Lymphocytes Relative: 30 %
Lymphs Abs: 2.3 10*3/uL (ref 0.7–4.0)
MCH: 31.2 pg (ref 26.0–34.0)
MCHC: 32.5 g/dL (ref 30.0–36.0)
MCV: 96.2 fL (ref 80.0–100.0)
Monocytes Absolute: 0.8 10*3/uL (ref 0.1–1.0)
Monocytes Relative: 10 %
Neutro Abs: 4.6 10*3/uL (ref 1.7–7.7)
Neutrophils Relative %: 57 %
Platelets: 122 10*3/uL — ABNORMAL LOW (ref 150–400)
RBC: 3.94 MIL/uL (ref 3.87–5.11)
RDW: 14.6 % (ref 11.5–15.5)
WBC: 7.9 10*3/uL (ref 4.0–10.5)
nRBC: 0 % (ref 0.0–0.2)

## 2021-01-28 LAB — COMPREHENSIVE METABOLIC PANEL
ALT: 13 U/L (ref 0–44)
AST: 20 U/L (ref 15–41)
Albumin: 2.7 g/dL — ABNORMAL LOW (ref 3.5–5.0)
Alkaline Phosphatase: 75 U/L (ref 38–126)
Anion gap: 8 (ref 5–15)
BUN: 18 mg/dL (ref 8–23)
CO2: 21 mmol/L — ABNORMAL LOW (ref 22–32)
Calcium: 8.6 mg/dL — ABNORMAL LOW (ref 8.9–10.3)
Chloride: 110 mmol/L (ref 98–111)
Creatinine, Ser: 0.83 mg/dL (ref 0.44–1.00)
GFR, Estimated: >60 mL/min (ref >60)
Glucose, Bld: 98 mg/dL (ref 70–99)
Potassium: 3.4 mmol/L — ABNORMAL LOW (ref 3.5–5.1)
Sodium: 139 mmol/L (ref 135–145)
Total Bilirubin: 0.8 mg/dL (ref 0.3–1.2)
Total Protein: 5.5 g/dL — ABNORMAL LOW (ref 6.5–8.1)

## 2021-01-28 LAB — PROTIME-INR
INR: 1.1 (ref 0.8–1.2)
Prothrombin Time: 14.6 seconds (ref 11.4–15.2)

## 2021-01-28 LAB — APTT: aPTT: 38 seconds — ABNORMAL HIGH (ref 24–36)

## 2021-01-28 MED ORDER — SENNOSIDES-DOCUSATE SODIUM 8.6-50 MG PO TABS: 1.0000 | ORAL_TABLET | Freq: Two times a day (BID) | ORAL | 0 refills | Status: AC

## 2021-01-28 MED ORDER — APIXABAN (ELIQUIS) VTE STARTER PACK (10MG AND 5MG): ORAL_TABLET | ORAL | 0 refills | Status: DC

## 2021-01-28 MED ORDER — POTASSIUM CHLORIDE CRYS ER 20 MEQ PO TBCR
40.0000 meq | EXTENDED_RELEASE_TABLET | Freq: Once | ORAL | Status: AC
  Administered 2021-01-28: 40 meq via ORAL
  Filled 2021-01-28: qty 2

## 2021-01-28 NOTE — Care Management Obs Status (Signed)
[employer] NOTIFICATION   Patient Details  Name: Kelly [name] MRN: [id number] Date of Birth: 02-15-1946   Medicare Observation Status Notification Given:  Yes    Tom-Johnson, Renea Ee, RN 01/28/2021, 2:01 PM

## 2021-01-28 NOTE — TOC Benefit Eligibility Note (Signed)
Transition of Care [facility]) Benefit Eligibility Note    Patient Details  Name: DAIZIE JOURNIGAN MRN: [id number] Date of Birth: 01/07/1946   Medication/Dose: Alveda Reasons  15 MG BID : CO-PAY- $64.00   and   XARELTO 20 MG DAILY : CO-PAY- $64.00  , RIVAROXABAN: NON-FORMULARY  Covered?: Yes  Tier: 3 Drug  Prescription Coverage [facility]: [facility]  Spoke with Person/Company/Phone Number:: BREANNA   @ [employer] # [phone]  Co-Pay: $ 64  Prior Approval: No  Deductible:  (NO DEDUCTIBLE WITH PLAN / OUT-OF-POCKET:UNMET)  Additional Notes: ELIQUIS  5 MG  : COVER- YES , CO-PAY- $64.00 , TIER- 3 DRUG, P/A-NO   and  ELIQUIS  2.5 MG BID : COVER-YES , CO-PAY- $64.00 , TIER- 3 DRUG , P/A-NO    APIXABAN and ELIQUIS 10 MG BID :NON-FORMULARY    Memory Argue Phone Number: 01/28/2021, 1:40 PM

## 2021-01-28 NOTE — Progress Notes (Signed)
DISCHARGE NOTE HOME AMIAH GUASTELLA to be discharged Home per MD order. Discussed prescriptions and follow up appointments with the patient. Prescriptions given to patient; medication list explained in detail. Patient verbalized understanding.  Skin clean, dry and intact without evidence of skin break down, no evidence of skin tears noted. IV catheter discontinued intact. Site without signs and symptoms of complications. Dressing and pressure applied. Pt denies pain at the site currently. No complaints noted.  Patient free of lines, drains, and wounds.   An After Visit Summary (AVS) was printed and given to the patient. Patient escorted via wheelchair, and discharged home via private auto.  Arlyss Repress, RN

## 2021-01-28 NOTE — Discharge Summary (Signed)
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 female with PMH significant for COPD, adenocarcinoma of the colon stage IIIb (Dx 05/2020, Colon resection 06/2020, FOLFOX completion 12/25/2020) and chemotherapy-induced polyneuropathy. Patient presented to the ED at [facility] on 8/27 with several day history of constipation and a palpable mass on right inguinal area. Typically moves bowel every day but has not had BM this time for several days followed by rectal pressure.  In the ED, patient was afebrile, hemodynamically stable, breathing on room air Labs with WC count 11.4, sodium 139, potassium 4.1, BUN/creatinine 22/0.82, liver enzymes normal CT abdomen pelvis showed  moderate to large stool ball with adjacent bowel wall thickening and fat stranding, likely suggestive of stercoral colitis.  1.1 cm enhancing mass in the left kidney concerning for renal cell carcinoma Nonobstructive thrombus within the IVC extending into the RIGHT common iliac vein. Cystic density within the RIGHT inguinal canal suspicious for loculated hydrocele versus a necrotic lymph node.    ED physician tried manual disimpaction which was moderately successful.  Patient was admitted to hospital service for further evaluation management.  Subjective: Patient was seen and examined this am.  Lying down on bed.  Not in distress.    No new symptoms.  Hospital course: Tumor of right kidney with IVC thrombus -1.1 cm enhancing mass in the left kidney radiologically suggestive of renal cell carcinoma. -MRI abdomen confirmed the findings.  I discussed the case with urology Dr. Jeffie Pollock and patient's oncologist Dr. Marylou Mccoy at [place].  Recommended outpatient follow-up.  -Patient is currently not having any hematuria or pain.  IVC thrombus -Nonobstructive thrombus within the IVC extending from the right common iliac vein.it is unclear at this time if the thrombus is related to underlying renal cell cancer. -Currently on full dose Lovenox.  Per discussion with patient's oncologist Dr. Marylou Mccoy, will discharge the patient on oral anticoagulant.  Benefits checked.  Prescription given for Eliquis starter pack.  Risk of bleeding, bruising discussed with the patient and her daughter.  Acute constipation Stercoral colitis -Several days of constipation with hard stool ball in rectum.  Manual disimpaction tried in ED with moderate success. -Continue Senokot daily and MiraLAX as needed.  Adenocarcinoma of the colon stage IIIb -Dx 05/2020, Colon resection 06/2020, FOLFOX completion 12/25/2020 -Follows with oncologist Dr. Marylou Mccoy at [place] [phone])   Chemotherapy-induced neuropathy -Continue Neurontin.   Allergies as of 01/28/2021       Reactions   Sulfa Antibiotics Rash        Medication List     STOP taking these medications    colestipol 1 g tablet Commonly known as: COLESTID   FLEET ENEMA RE       TAKE these medications    Apixaban Starter Pack ('10mg'$  and '5mg'$ ) Commonly known as: ELIQUIS STARTER PACK Take as directed on package: start with two-'5mg'$  tablets twice daily for 7 days. On day  8, switch to one-'5mg'$  tablet twice daily.   bisacodyl 5 MG EC tablet Commonly known as: DULCOLAX Take 10 mg by mouth daily as needed for moderate constipation.   gabapentin 300 MG capsule Commonly known as:  NEURONTIN Take 300 mg by mouth at bedtime.   hydrocortisone 2.5 % rectal cream Commonly known as: ANUSOL-HC Place 1 application rectally 2 (two) times daily as needed for hemorrhoids.   Melatonin 10 MG Tabs Take 10 mg by mouth at bedtime.   omeprazole 40 MG capsule Commonly known as: PRILOSEC Take 40 mg by mouth daily as needed (acid reflux).   senna-docusate 8.6-50 MG tablet Commonly known as: Senokot-S Take 1 tablet by mouth 2 (two) times daily.        Discharge Instructions:  Diet Recommendation:  Discharge Diet Orders (From admission, onward)     Start     Ordered   01/28/21 0000  Diet general        01/28/21 1431              Follow with Primary MD Katherina Mires, MD in 7 days   Get CBC/BMP checked in next visit within 1 week by PCP or SNF MD ( we routinely change or add medications that can affect your baseline labs and fluid status, therefore we recommend that you get the mentioned basic workup next visit with your PCP, your PCP may decide not to get them or add new tests based on their clinical decision)  On your next visit with your PCP, please Get Medicines reviewed and adjusted.  Please request your PCP  to go over all Hospital Tests and Procedure/Radiological results at the follow up, please get all Hospital records sent to your Prim MD by signing hospital release before you go home.  Activity: As tolerated with Full fall precautions use walker/cane & assistance as needed  For Heart failure patients - Check your Weight same time everyday, if you gain over 2 pounds, or you develop in leg swelling, experience more shortness of breath or chest pain, call your Primary MD immediately. Follow Cardiac Low Salt Diet and 1.5 lit/day fluid restriction.  If you have smoked or chewed Tobacco in the last 2 yrs please stop smoking, stop any regular Alcohol  and or any Recreational drug use.  If you experience worsening of your admission symptoms, develop shortness of  breath, life threatening emergency, suicidal or homicidal thoughts you must seek medical attention immediately by calling 911 or calling your MD immediately  if symptoms less severe.  You Must read complete instructions/literature along with all the possible adverse reactions/side effects for all the Medicines you take and that have been prescribed to you. Take any new Medicines after you have completely understood and accpet all the possible adverse reactions/side effects.   Do not drive, operate heavy machinery, perform activities at heights, swimming or participation in water activities or provide baby sitting services if your were admitted for syncope or siezures until you have seen by Primary MD or a Neurologist and advised to do so again.  Do not drive when taking Pain medications.  Do not take more than prescribed Pain, Sleep and Anxiety Medications  Wear Seat belts while driving.   Please note You were cared for by a hospitalist during your hospital stay. If you have any questions about your discharge medications or the care you received while you were in the hospital after you are discharged, you can call the unit and asked to speak with  the hospitalist on call if the hospitalist that took care of you is not available. Once you are discharged, your primary care physician will handle any further medical issues. Please note that NO REFILLS for any discharge medications will be authorized once you are discharged, as it is imperative that you return to your primary care physician (or establish a relationship with a primary care physician if you do not have one) for your aftercare needs so that they can reassess your need for medications and monitor your lab values.    Follow ups:    Follow-up Information     Katherina Mires, MD Follow up.   Specialty: Family Medicine Contact information: [place] [address] [phone]         Irine Seal, MD  Follow up.   Specialty: Urology Contact information: [place] [address] [phone]         Ala Dach, MD Follow up.   Specialty: Hematology and Oncology Contact information: [address] [place] [address] [phone]                 Wound care:   Incision - 3 Ports Abdomen 1: Umbilicus 2: Right;Upper 3: Right;Medial (Active)  Placement Date/Time: 10/26/15 2007   Location of Ports: Abdomen  Port: 1:  Location Orientation: Umbilicus  Port: 2:  Location Orientation: Right;Upper  Port: 3:  Location Orientation: Right;Medial    Assessments 10/26/2015  8:51 PM 10/27/2015  8:15 AM  Port 1 Drainage Amount -- None  Port 1 Dressing Type Gauze (Comment);Transparent dressing Gauze (Comment)  Port 1 Dressing Status Clean;Dry;Intact Clean;Dry;Intact  Port 2 Drainage Amount -- None  Port 2 Dressing Type Gauze (Comment);Transparent dressing Gauze (Comment)  Port 2 Dressing Status Clean;Dry;Intact Clean;Dry;Intact  Port 3 Drainage Amount -- None  Port 3 Dressing Type Gauze (Comment);Transparent dressing Gauze (Comment)  Port 3 Dressing Status Clean;Dry;Intact Clean;Dry;Intact     No Linked orders to display     Incision - 1 Port Abdomen Other (Comment);Right;Lateral (Active)  Placement Date/Time: 10/26/15 (c)   Location of Ports: Abdomen  Location Orientation: (c) Other (Comment);Right;Lateral    Assessments 10/26/2015  9:04 PM 10/27/2015  8:15 AM  Port 1 Site Assessment Clean;Dry --  Port 1 Drainage Amount -- None  Port 1 Dressing Type Gauze (Comment) Gauze (Comment)  Port 1 Dressing Status Clean;Dry;Intact Clean;Dry;Intact     No Linked orders to display    Discharge Exam:   Vitals:   01/27/21 1628 01/27/21 2139 01/28/21 0548 01/28/21 0943  BP: 113/82 121/70 112/64 112/75  Pulse: 81 79 72 68  Resp: '18 16 16 18  '$ Temp: 98 F (36.7 C) 98.1 F (36.7 C) 98.1 F (36.7 C) 98.5 F (36.9 C)  TempSrc:  Oral Oral Oral   SpO2: 96% 96% 93% 95%  Weight:      Height:        Body mass index is 23.33 kg/m.  General exam: Pleasant, elderly Caucasian female.  Not in distress Skin: No rashes, lesions or ulcers. HEENT: Atraumatic, normocephalic, no obvious bleeding Lungs: Clear to auscultation bilaterally CVS: Regular rate and rhythm, no murmur GI/Abd soft, nontender, nondistended, bowel sound present CNS: Alert, awake, oriented x3 Psychiatry: Mood appropriate Extremities: No pedal edema, no calf tenderness  Time coordinating discharge: 35 minutes   The results of significant diagnostics from this hospitalization (including imaging, microbiology, ancillary and laboratory) are listed below for reference.    Procedures  and Diagnostic Studies:   MR ABDOMEN W WO CONTRAST  Result Date: 01/27/2021 CLINICAL DATA:  Evaluate kidney lesion EXAM: MRI ABDOMEN WITHOUT AND WITH CONTRAST TECHNIQUE: Multiplanar multisequence MR imaging of the abdomen was performed both before and after the administration of intravenous contrast. CONTRAST:  58m GADAVIST GADOBUTROL 1 MMOL/ML IV SOLN COMPARISON:  CT AP 01/26/2021 FINDINGS: Lower chest: No acute findings. Hepatobiliary: Small arterial phase enhancing structure within the posterior right hepatic lobe measures 4 mm. This becomes isointense on the delayed images and is favored to represent a benign perfusion anomaly. No suspicious enhancing liver lesions identified. Cholecystectomy. Increase caliber of the common bile duct measures 1.4 cm. This is unchanged when compared with 08/17/2020. No choledocholithiasis identified. Pancreas: No mass, inflammatory changes, or other parenchymal abnormality identified. Spleen:  Within normal limits in size and appearance. Adrenals/Urinary Tract: Normal adrenal glands. Arising off the upper pole of the left kidney is a solid enhancing, partially exophytic lesion measuring 1.5 cm, image 48/7. Suspicious for renal cell carcinoma. Septated cyst  containing layering hemorrhagic debris arising off the posterior cortex of the interpolar left kidney measures 1.9 cm, image 64/13. Simple appearing cyst measures 2.9 cm in arises off the inferior pole of the left kidney, image 25/5. Additional small kidney lesions are noted which are technically too small to reliably characterize. Stomach/Bowel: Visualized portions within the abdomen are unremarkable. Vascular/Lymphatic: Aortic atherosclerosis. No aneurysm. No abdominal adenopathy. Other:  No free fluid or fluid collections identified. Musculoskeletal: No suspicious bone lesions identified. IMPRESSION: 1. Arising off the upper pole of the left kidney is a solid enhancing, partially exophytic lesion measuring 1.5 cm. Suspicious for renal cell carcinoma. No signs to suggest nodal or solid organ metastasis. 2. Additional kidney lesions identified compatible with Bosniak category 1 and 2 cysts. Electronically Signed   By: TKerby MoorsM.D.   On: 01/27/2021 09:48   CT Abdomen Pelvis W Contrast  Result Date: 01/26/2021 CLINICAL DATA:  Bowel obstruction suspected EXAM: CT ABDOMEN AND PELVIS WITH CONTRAST TECHNIQUE: Multidetector CT imaging of the abdomen and pelvis was performed using the standard protocol following bolus administration of intravenous contrast. CONTRAST:  1066mOMNIPAQUE IOHEXOL 350 MG/ML SOLN COMPARISON:  August 17, 2020 FINDINGS: Lower chest: LEFT basilar consolidative opacity, improved since prior and likely reflecting atelectasis. Improvement of RIGHT basilar consolidative opacity with minimal residual linear opacity consistent with atelectasis/scar. Hepatobiliary: Status post cholecystectomy. There is dilation of the common bile duct proximally 15 mm, previously 17 mm. No focal hepatic lesion identified. Pancreas: Unremarkable. No pancreatic ductal dilatation or surrounding inflammatory changes. Spleen: Splenomegaly, mild. Adrenals/Urinary Tract: Adrenal glands are unremarkable. There is an  enhancing exophytic mass of the LEFT kidney which measures 11 mm (series 5, image 36). Multiple renal cysts. Nonobstructive RIGHT-sided nephrolithiasis. Bladder is distended. Stomach/Bowel: There is large rectal stool ball with adjacent mild rectal wall thickening and fat stranding. Extensive diverticulosis. Status post RIGHT colonic surgery. No evidence of appendicitis. There is moderate colonic stool burden predominately within the LEFT hemicolon. Vascular/Lymphatic: Atherosclerotic calcifications of the aorta. Circumaortic LEFT renal vein Reproductive: Status post hysterectomy. Other: Increase in size of a low-density masslike area in the RIGHT inguinal canal. It measures 33 x 27 mm, previously 14 x 14 mm (series 2, image 77). There is a nonobstructive filling defect within the inferior vena cava extending into the RIGHT common iliac vein consistent with thrombus. Musculoskeletal: Transitional anatomy with bilateral assimilation joints at L5-S1. IMPRESSION: 1. Moderate to large stool ball with adjacent bowel wall thickening  and fat stranding. 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 2. There is a nonobstructive thrombus within the IVC extending into the RIGHT common iliac vein. 3. There is a 11 mm enhancing mass of the LEFT kidney which is concerning for renal cell carcinoma. Recommend dedicated evaluation with renal cell carcinoma protocol CT or MRI when clinically appropriate. 4. Cystic density within the RIGHT inguinal canal may reflect loculated hydrocele (favored). Additional differential considerations include a necrotic lymph node. Recommend correlation physical exam and ultrasound as deemed necessary These results were called by telephone at the time of interpretation on 01/26/2021 at 8:36 pm to provider [facility] , who verbally acknowledged these results. Electronically Signed   By: Valentino Saxon M.D.   On: 01/26/2021 20:42     Labs:   Basic Metabolic Panel: Recent Labs  Lab  01/26/21 1925 01/27/21 0638 01/28/21 0342  NA 139 137 139  K 4.1 4.0 3.4*  CL 106 109 110  CO2 24 22 21*  GLUCOSE 109* 117* 98  BUN '22 15 18  '$ CREATININE 0.82 0.74 0.83  CALCIUM 8.7* 8.7* 8.6*   GFR Estimated Creatinine Clearance: 63.1 mL/min (by C-G formula based on SCr of 0.83 mg/dL). Liver Function Tests: Recent Labs  Lab 01/26/21 1925 01/27/21 0638 01/28/21 0342  AST '22 22 20  '$ ALT '14 14 13  '$ ALKPHOS 85 85 75  BILITOT 0.7 1.3* 0.8  PROT 6.7 6.0* 5.5*  ALBUMIN 3.6 3.1* 2.7*   Recent Labs  Lab 01/26/21 1925  LIPASE 23   No results for input(s): AMMONIA in the last 168 hours. Coagulation profile Recent Labs  Lab 01/27/21 0638 01/28/21 0342  INR 1.1 1.1    CBC: Recent Labs  Lab 01/26/21 1925 01/27/21 0638 01/28/21 0342  WBC 11.4* 11.5* 7.9  NEUTROABS 8.6* 8.2* 4.6  HGB 13.6 13.2 12.3  HCT 41.2 39.5 37.9  MCV 96.3 94.3 96.2  PLT 147* 139* 122*   Cardiac Enzymes: No results for input(s): CKTOTAL, CKMB, CKMBINDEX, TROPONINI in the last 168 hours. BNP: Invalid input(s): POCBNP CBG: No results for input(s): GLUCAP in the last 168 hours. D-Dimer No results for input(s): DDIMER in the last 72 hours. Hgb A1c No results for input(s): HGBA1C in the last 72 hours. Lipid Profile No results for input(s): CHOL, HDL, LDLCALC, TRIG, CHOLHDL, LDLDIRECT in the last 72 hours. Thyroid function studies No results for input(s): TSH, T4TOTAL, T3FREE, THYROIDAB in the last 72 hours.  Invalid input(s): FREET3 Anemia work up No results for input(s): VITAMINB12, FOLATE, FERRITIN, TIBC, IRON, RETICCTPCT in the last 72 hours. Microbiology Recent Results (from the past 240 hour(s))  Resp Panel by RT-PCR (Flu A&B, Covid)     Status: None   Collection Time: 01/27/21  1:30 AM  Result Value Ref Range Status   SARS Coronavirus 2 by RT PCR NEGATIVE NEGATIVE Final    Comment: (NOTE) SARS-CoV-2 target nucleic acids are NOT DETECTED.  The SARS-CoV-2 RNA is generally detectable  in upper respiratory specimens during the acute phase of infection. The lowest concentration of SARS-CoV-2 viral copies this assay can detect is 138 copies/mL. A negative result does not preclude SARS-Cov-2 infection and should not be used as the sole basis for treatment or other patient management decisions. A negative result may occur with  improper specimen collection/handling, submission of specimen other than nasopharyngeal swab, presence of viral mutation(s) within the areas targeted by this assay, and inadequate number of viral copies(<138 copies/mL). A negative result must be combined with clinical observations, patient history,  and epidemiological information. The expected result is Negative.  Fact Sheet for Patients:  EntrepreneurPulse.com.au  Fact Sheet for Healthcare Providers:  IncredibleEmployment.be  This test is no t yet approved or cleared by the [place] FDA and  has been authorized for detection and/or diagnosis of SARS-CoV-2 by FDA under an Emergency Use Authorization (EUA). This EUA will remain  in effect (meaning this test can be used) for the duration of the COVID-19 declaration under Section 564(b)(1) of the Act, 21 U.S.C.section 360bbb-3(b)(1), unless the authorization is terminated  or revoked sooner.       Influenza A by PCR NEGATIVE NEGATIVE Final   Influenza B by PCR NEGATIVE NEGATIVE Final    Comment: (NOTE) The Xpert Xpress SARS-CoV-2/FLU/RSV plus assay is intended as an aid in the diagnosis of influenza from Nasopharyngeal swab specimens and should not be used as a sole basis for treatment. Nasal washings and aspirates are unacceptable for Xpert Xpress SARS-CoV-2/FLU/RSV testing.  Fact Sheet for Patients: EntrepreneurPulse.com.au  Fact Sheet for Healthcare Providers: IncredibleEmployment.be  This test is not yet approved or cleared by the [place] FDA and has  been authorized for detection and/or diagnosis of SARS-CoV-2 by FDA under an Emergency Use Authorization (EUA). This EUA will remain in effect (meaning this test can be used) for the duration of the COVID-19 declaration under Section 564(b)(1) of the Act, 21 U.S.C. section 360bbb-3(b)(1), unless the authorization is terminated or revoked.  Performed at [facility], [address] [place], [place] [address]      Signed: Terrilee Croak  [facility] 01/28/2021, 2:30 PM

## 2021-01-28 NOTE — TOC Transition Note (Signed)
Transition of Care [facility]) - CM/SW Discharge Note   Patient Details  Name: Kelly [name] MRN: [id number] Date of Birth: 1945/08/07  [facility] CM/SW Contact:  Tom-Johnson, Renea Ee, RN Phone Number: 01/28/2021, 3:35 PM   Clinical Narrative:    Spoke with patient at bedside. Patient states she lives with her daughter who takes her to and from her appointments as she does not drive due to her neuropathy to feet. Denies any TOC needs at this time. States she does not have DME's and does not need it. Observation letter explained to patient with understanding verbalized. Letter given to patient at bedside. No further TOC needs at this time.  Final next level of care: Home/Self Care Barriers to Discharge: No Barriers Identified   Patient Goals and CMS Choice Patient states their goals for this hospitalization and ongoing recovery are:: To go home.      Discharge Placement                       Discharge Plan and Services                DME Arranged: N/A DME Agency: NA       HH Arranged: NA HH Agency: NA        Social Determinants of Health (SDOH) Interventions     Readmission Risk Interventions No flowsheet data found.

## 2021-06-15 ENCOUNTER — Inpatient Hospital Stay (HOSPITAL_COMMUNITY): Payer: Medicare PPO

## 2021-06-15 ENCOUNTER — Emergency Department (HOSPITAL_COMMUNITY): Payer: Medicare PPO

## 2021-06-15 ENCOUNTER — Other Ambulatory Visit: Payer: Self-pay

## 2021-06-15 ENCOUNTER — Observation Stay (HOSPITAL_COMMUNITY)
Admission: EM | Admit: 2021-06-15 | Discharge: 2021-06-17 | Disposition: A | Discharge status: Home / Self Care | Payer: Medicare PPO | Attending: Family Medicine | Admitting: Family Medicine

## 2021-06-15 DIAGNOSIS — R413 Other amnesia: Secondary | ICD-10-CM | POA: Diagnosis not present

## 2021-06-15 DIAGNOSIS — Z85038 Personal history of other malignant neoplasm of large intestine: Secondary | ICD-10-CM | POA: Insufficient documentation

## 2021-06-15 DIAGNOSIS — R55 Syncope and collapse: Principal | ICD-10-CM | POA: Diagnosis present

## 2021-06-15 DIAGNOSIS — Z20822 Contact with and (suspected) exposure to covid-19: Secondary | ICD-10-CM | POA: Insufficient documentation

## 2021-06-15 DIAGNOSIS — J449 Chronic obstructive pulmonary disease, unspecified: Secondary | ICD-10-CM | POA: Diagnosis not present

## 2021-06-15 DIAGNOSIS — R200 Anesthesia of skin: Secondary | ICD-10-CM | POA: Insufficient documentation

## 2021-06-15 DIAGNOSIS — Z87891 Personal history of nicotine dependence: Secondary | ICD-10-CM | POA: Insufficient documentation

## 2021-06-15 DIAGNOSIS — R410 Disorientation, unspecified: Secondary | ICD-10-CM | POA: Diagnosis not present

## 2021-06-15 DIAGNOSIS — R4182 Altered mental status, unspecified: Secondary | ICD-10-CM | POA: Diagnosis present

## 2021-06-15 DIAGNOSIS — Z86718 Personal history of other venous thrombosis and embolism: Secondary | ICD-10-CM | POA: Diagnosis not present

## 2021-06-15 DIAGNOSIS — Z7901 Long term (current) use of anticoagulants: Secondary | ICD-10-CM | POA: Insufficient documentation

## 2021-06-15 LAB — I-STAT CHEM 8, ED
BUN: 19 mg/dL (ref 8–23)
Calcium, Ion: 1.11 mmol/L — ABNORMAL LOW (ref 1.15–1.40)
Chloride: 105 mmol/L (ref 98–111)
Creatinine, Ser: 0.9 mg/dL (ref 0.44–1.00)
Glucose, Bld: 134 mg/dL — ABNORMAL HIGH (ref 70–99)
HCT: 42 % (ref 36.0–46.0)
Hemoglobin: 14.3 g/dL (ref 12.0–15.0)
Potassium: 3.5 mmol/L (ref 3.5–5.1)
Sodium: 142 mmol/L (ref 135–145)
TCO2: 24 mmol/L (ref 22–32)

## 2021-06-15 LAB — CBC
HCT: 43.1 % (ref 36.0–46.0)
Hemoglobin: 14.3 g/dL (ref 12.0–15.0)
MCH: 30 pg (ref 26.0–34.0)
MCHC: 33.2 g/dL (ref 30.0–36.0)
MCV: 90.4 fL (ref 80.0–100.0)
Platelets: 186 10*3/uL (ref 150–400)
RBC: 4.77 MIL/uL (ref 3.87–5.11)
RDW: 12.4 % (ref 11.5–15.5)
WBC: 8.9 10*3/uL (ref 4.0–10.5)
nRBC: 0 % (ref 0.0–0.2)

## 2021-06-15 LAB — RESP PANEL BY RT-PCR (FLU A&B, COVID) ARPGX2
Influenza A by PCR: NEGATIVE
Influenza B by PCR: NEGATIVE
SARS Coronavirus 2 by RT PCR: NEGATIVE

## 2021-06-15 LAB — DIFFERENTIAL
Abs Immature Granulocytes: 0.05 10*3/uL (ref 0.00–0.07)
Basophils Absolute: 0 10*3/uL (ref 0.0–0.1)
Basophils Relative: 0 %
Eosinophils Absolute: 0.1 10*3/uL (ref 0.0–0.5)
Eosinophils Relative: 1 %
Immature Granulocytes: 1 %
Lymphocytes Relative: 22 %
Lymphs Abs: 2 10*3/uL (ref 0.7–4.0)
Monocytes Absolute: 0.5 10*3/uL (ref 0.1–1.0)
Monocytes Relative: 6 %
Neutro Abs: 6.2 10*3/uL (ref 1.7–7.7)
Neutrophils Relative %: 70 %

## 2021-06-15 LAB — COMPREHENSIVE METABOLIC PANEL
ALT: 12 U/L (ref 0–44)
AST: 16 U/L (ref 15–41)
Albumin: 3.6 g/dL (ref 3.5–5.0)
Alkaline Phosphatase: 78 U/L (ref 38–126)
Anion gap: 9 (ref 5–15)
BUN: 18 mg/dL (ref 8–23)
CO2: 24 mmol/L (ref 22–32)
Calcium: 8.8 mg/dL — ABNORMAL LOW (ref 8.9–10.3)
Chloride: 106 mmol/L (ref 98–111)
Creatinine, Ser: 0.94 mg/dL (ref 0.44–1.00)
GFR, Estimated: >60 mL/min (ref >60)
Glucose, Bld: 138 mg/dL — ABNORMAL HIGH (ref 70–99)
Potassium: 3.5 mmol/L (ref 3.5–5.1)
Sodium: 139 mmol/L (ref 135–145)
Total Bilirubin: 1.1 mg/dL (ref 0.3–1.2)
Total Protein: 6.6 g/dL (ref 6.5–8.1)

## 2021-06-15 LAB — MAGNESIUM: Magnesium: 2 mg/dL (ref 1.7–2.4)

## 2021-06-15 LAB — CBG MONITORING, ED: Glucose-Capillary: 146 mg/dL — ABNORMAL HIGH (ref 70–99)

## 2021-06-15 LAB — APTT: aPTT: 30 seconds (ref 24–36)

## 2021-06-15 LAB — PROTIME-INR
INR: 1.1 (ref 0.8–1.2)
Prothrombin Time: 13.9 seconds (ref 11.4–15.2)

## 2021-06-15 LAB — TROPONIN I (HIGH SENSITIVITY): Troponin I (High Sensitivity): 8 ng/L (ref <18)

## 2021-06-15 IMAGING — CT CT HEAD CODE STROKE
4 series · 16 of 47 positions shown, 18 images · non-contrast
Comparison: None.

CLINICAL DATA: Code stroke.



[Series 2: head wo · axial · 0.46mm/px · z∈[+1346,+1456]mm · 6 of 32 slices shown, 8 images]
[im 5/32  brain]
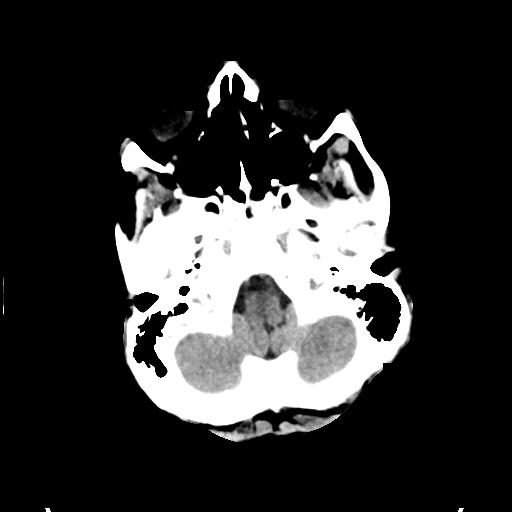
[im 5/32  bone]
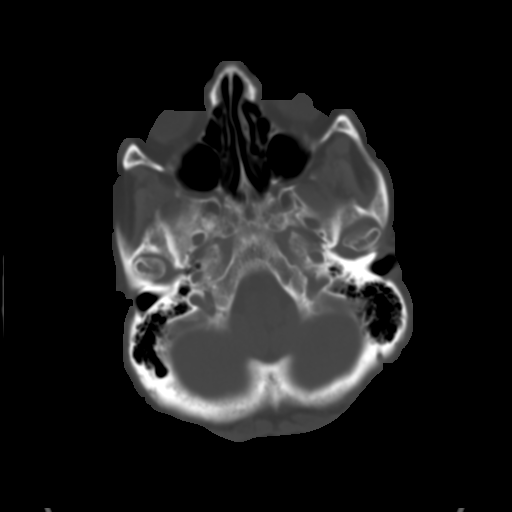
[im 9/32  brain]
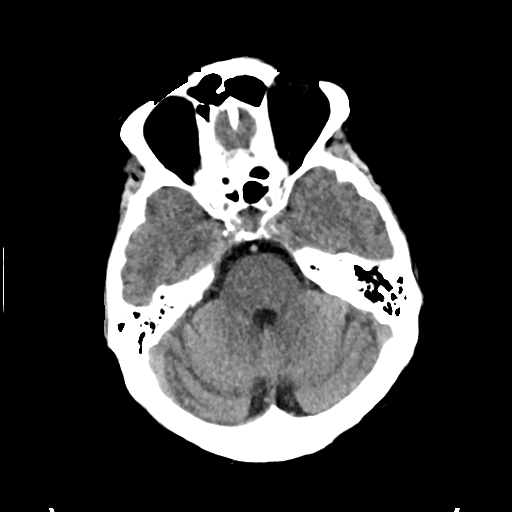
[im 14/32  brain]
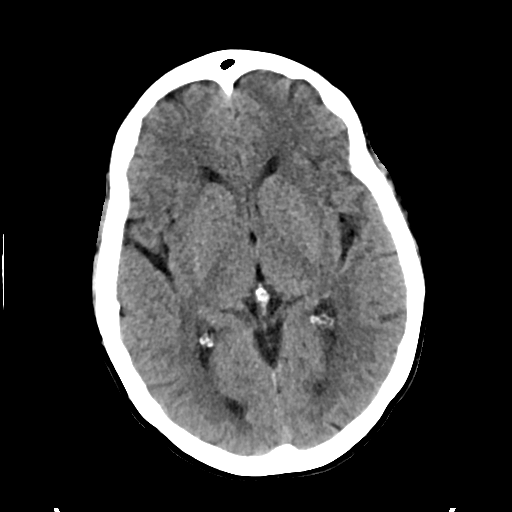
[im 18/32  brain]
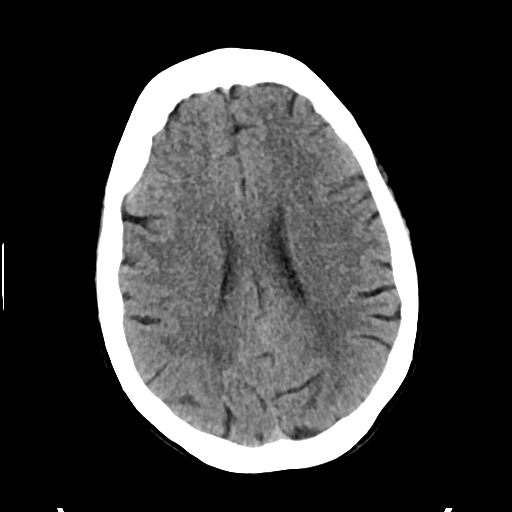
[im 23/32  brain]
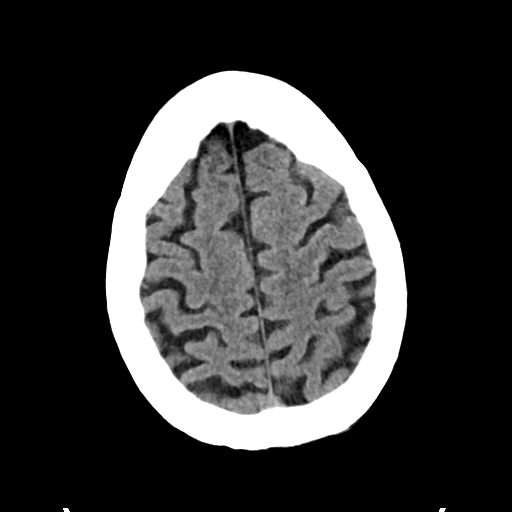
[im 23/32  bone]
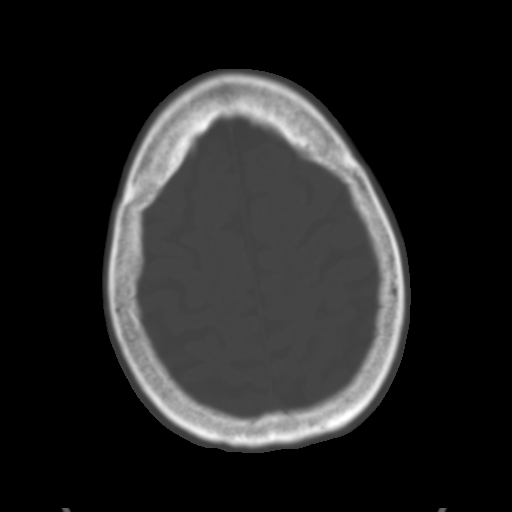
[im 27/32  brain]
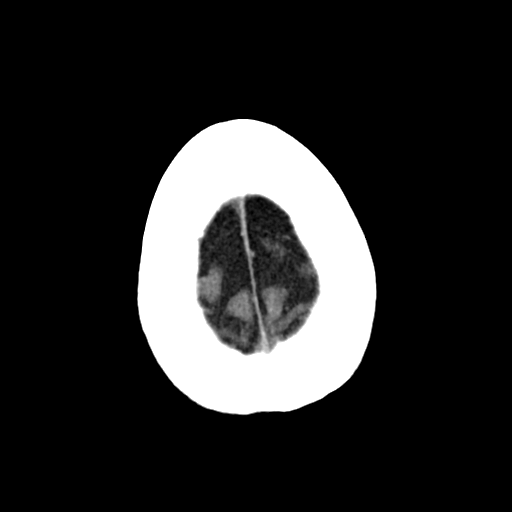

[Series 4: head bone · axial · 0.46mm/px · z∈[+1340,+1394]mm · 4 of 82 slices shown]
[im 8/82  bone]
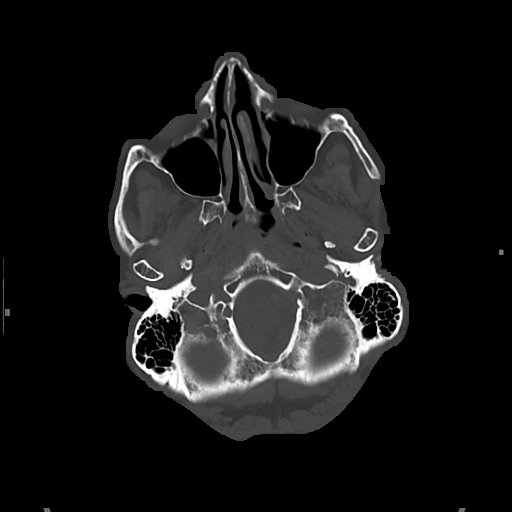
[im 16/82  bone]
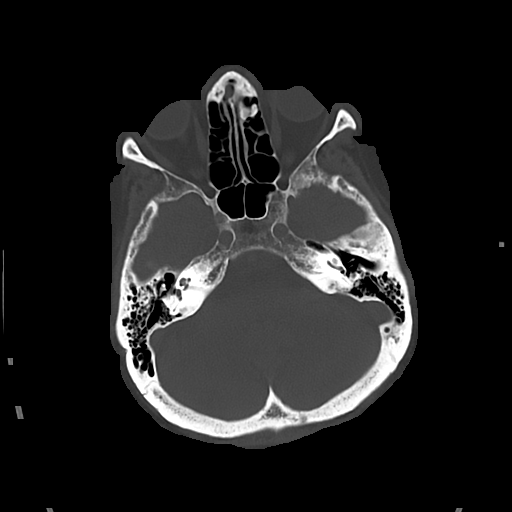
[im 28/82  bone]
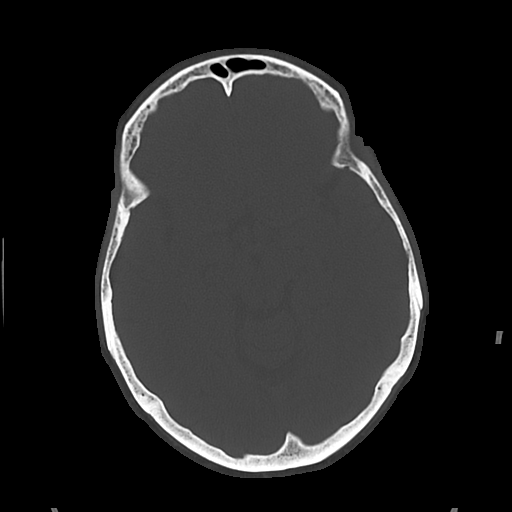
[im 35/82  bone]
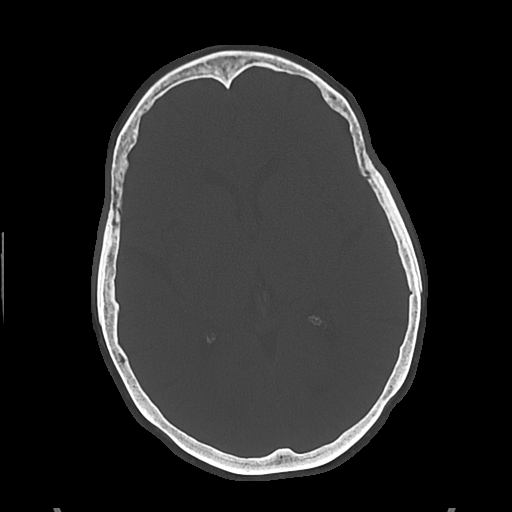

[Series 5: cor soft · coronal · 0.35mm/px · 3 of 70 slices shown]
[im 24/70  brain]
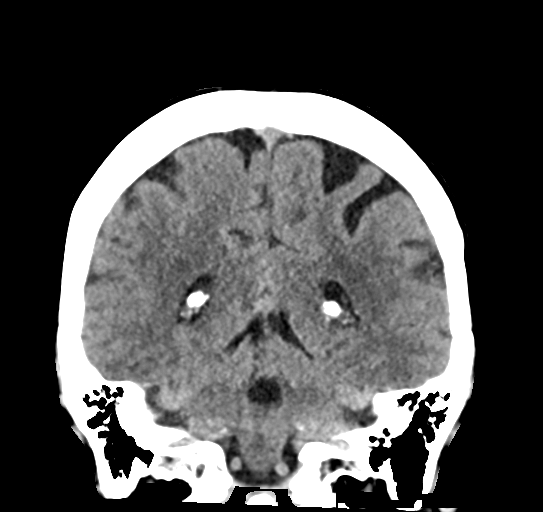
[im 31/70  brain]
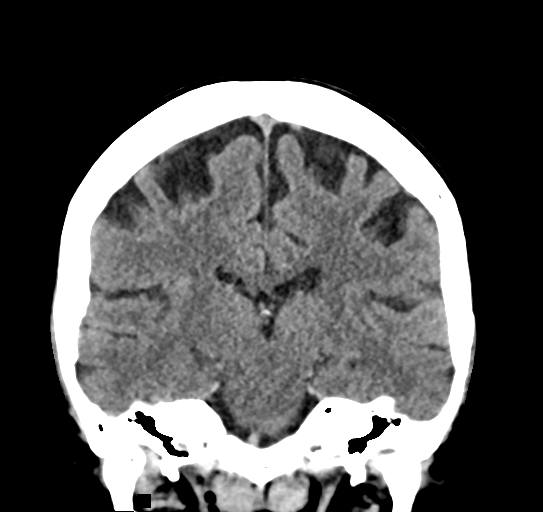
[im 39/70  brain]
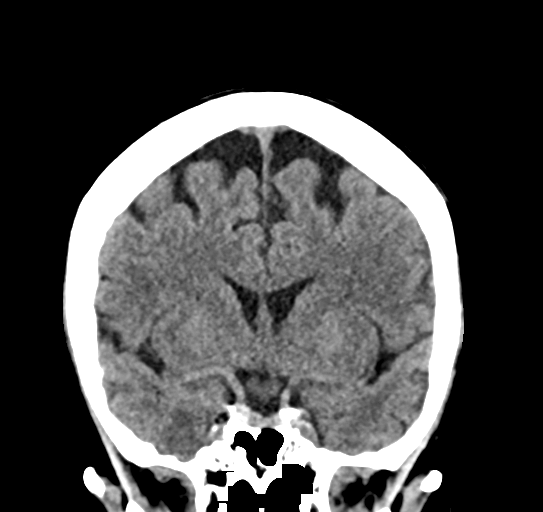

[Series 6: sag soft · sagittal · 0.34mm/px · 3 of 52 slices shown]
[im 18/52  brain]
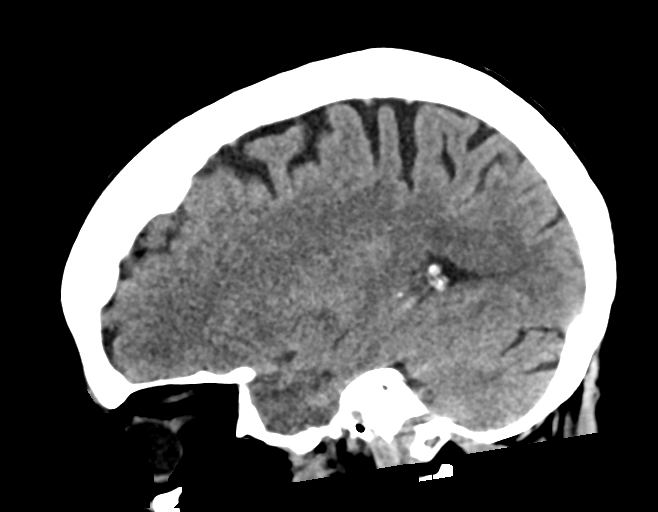
[im 26/52  brain]
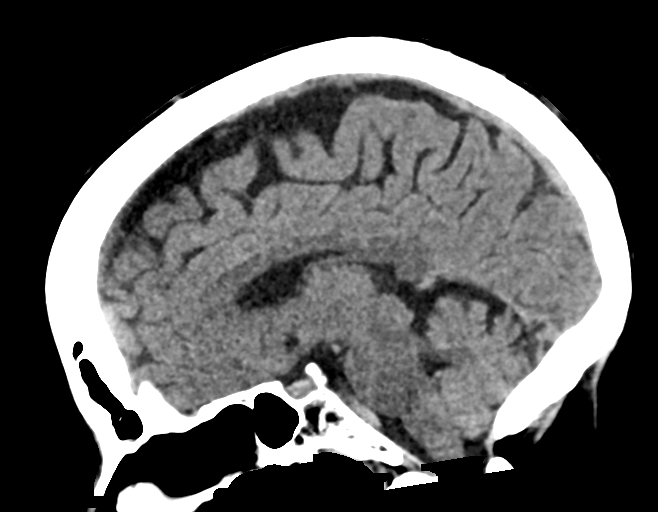
[im 35/52  brain]
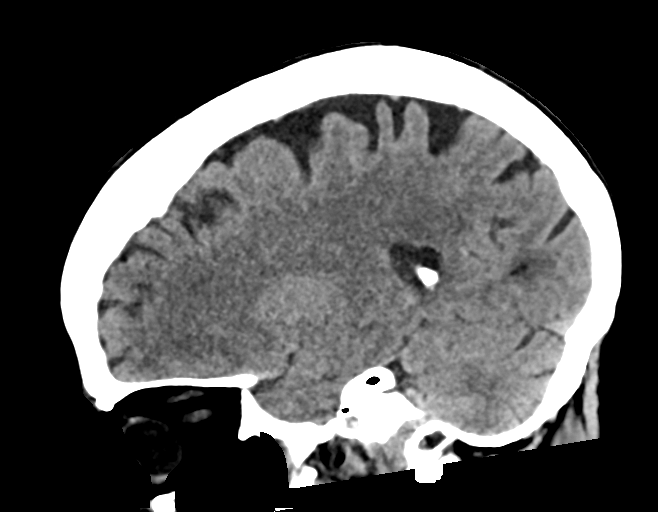

[16 of 47 positions shown; findings below may reference images not displayed]

FINDINGS: Brain: There is no acute intracranial hemorrhage, mass effect, or
edema. Gray-white differentiation is preserved. Ventricles and sulci
are within normal limits in size and configuration. Patchy
hypoattenuation in the supratentorial white matter is nonspecific
but may reflect mild chronic microvascular ischemic changes. No
extra-axial collection.

Vascular: No hyperdense vessel. There is intracranial
atherosclerotic calcification at the skull base.

Skull: Unremarkable.

Sinuses/Orbits: Small left anterior ethmoid osteoma. Orbits are
unremarkable.

Other: Mastoid air cells are clear.

ASPECTS ([place]ber[place] Stroke Program Early CT Score)

- Ganglionic level infarction (caudate, lentiform nuclei, internal
capsule, insula, M1-M3 cortex): 7

- Supraganglionic infarction (M4-M6 cortex): 3

Total score (0-10 with 10 being normal): 10
IMPRESSION: There is no acute intracranial hemorrhage or evidence of acute
infarction. ASPECT score is 10.

These results were communicated to Dr. TIGER at [DATE] on
[DATE] by text page via the [employer] messaging system.

## 2021-06-15 IMAGING — MR MR HEAD WO/W CM
12 of 14 series · 38 of 48 positions shown · IV contrast (gadavist)
Comparison: None.

CLINICAL DATA: Transient ischemic attack

EXAM:
MRI HEAD WITHOUT AND WITH CONTRAST
TECHNIQUE: Multiplanar, multiecho pulse sequences of the brain and surrounding
structures were obtained without and with intravenous contrast.
CONTRAST:  7.5mL GADAVIST GADOBUTROL 1 MMOL/ML IV SOLN

[Series 5: DWI · axial · 3.0mm · 0.88mm/px · z∈[-46,+100]mm · 7 of 100 slices shown (1 of 4)]
[im 1/100]
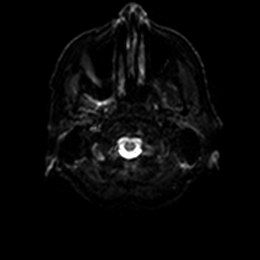
[im 17/100]
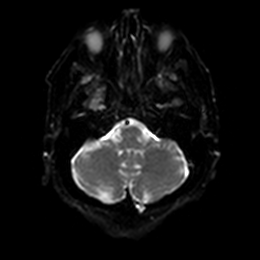
[im 34/100]
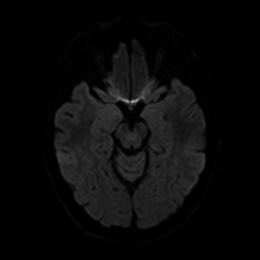
[im 50/100]
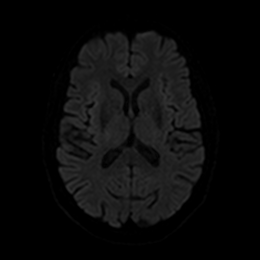
[im 67/100]
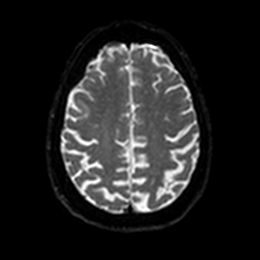
[im 83/100]
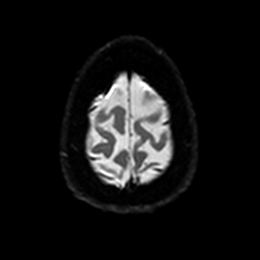
[im 100/100]
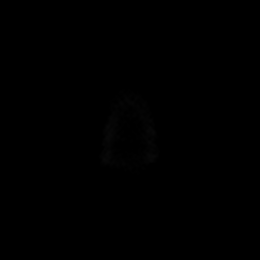

[Series 6: DWI · axial · 3.0mm · 0.88mm/px · z∈[-46,+100]mm · 3 of 49 slices shown (2 of 4)]
[im 1/49]
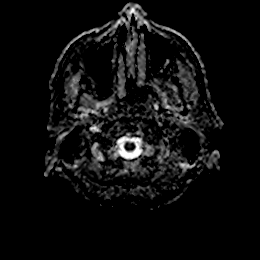
[im 25/49]
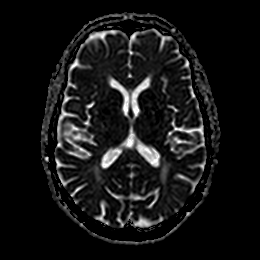
[im 49/49]
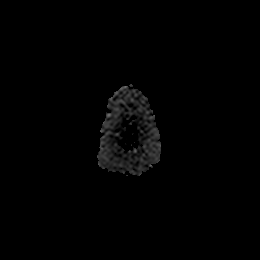

[Series 7: DWI · coronal · 4.0mm · 0.88mm/px · 5 of 66 slices shown (3 of 4)]
[im 1/66]
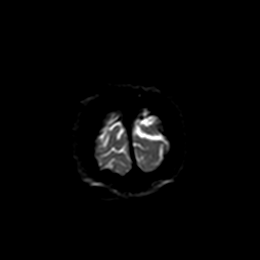
[im 17/66]
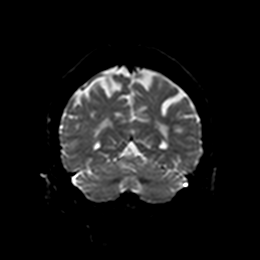
[im 33/66]
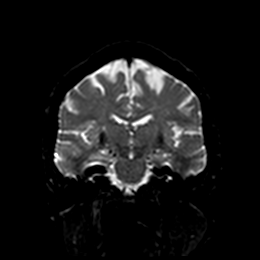
[im 49/66]
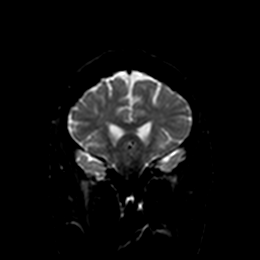
[im 66/66]
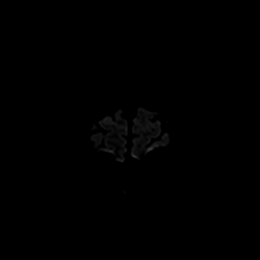

[Series 8: DWI · coronal · 4.0mm · 0.88mm/px · 2 of 33 slices shown (4 of 4)]
[im 1/33]
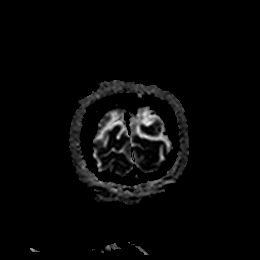
[im 33/33]
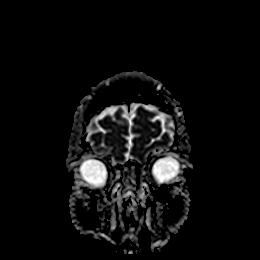

[Series 9: T1 · sagittal · 5.0mm · 0.75mm/px · 2 of 23 slices shown]
[im 1/23]
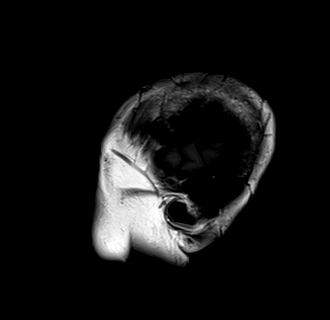
[im 23/23]
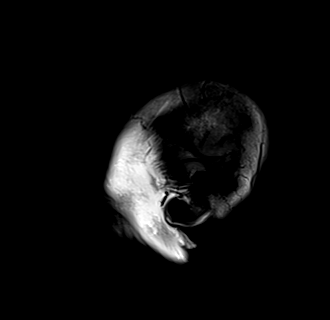

[Series 10: T2 · axial · 5.0mm · 0.72mm/px · z∈[-47,+96]mm · 2 of 25 slices shown]
[im 1/25]
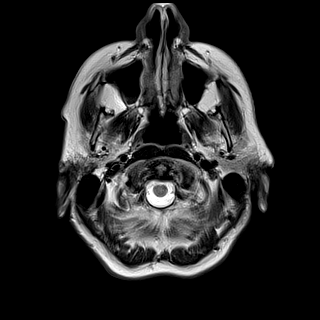
[im 25/25]
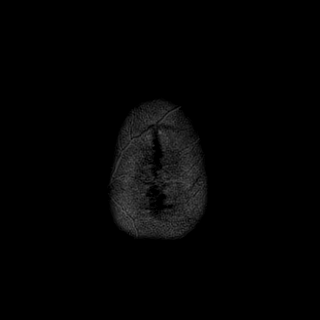

[Series 11: FLAIR · axial · 5.0mm · 0.45mm/px · z∈[-47,+97]mm · 2 of 25 slices shown]
[im 1/25]
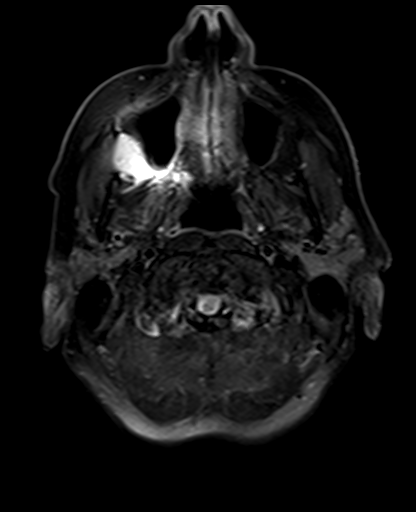
[im 25/25]
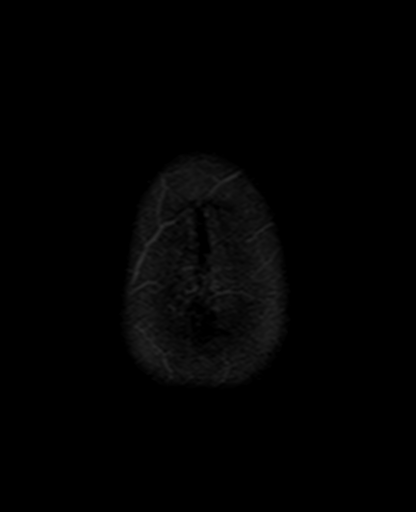

[Series 13: pha_images · axial · 3.0mm · 0.90mm/px · z∈[-61,+115]mm · 4 of 59 slices shown]
[im 1/59]
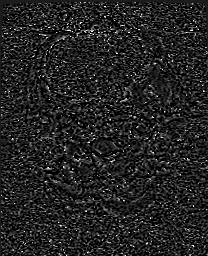
[im 20/59]
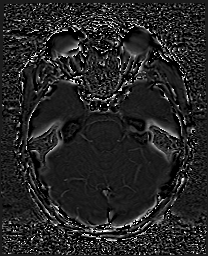
[im 39/59]
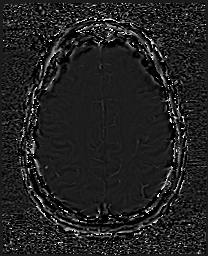
[im 59/59]
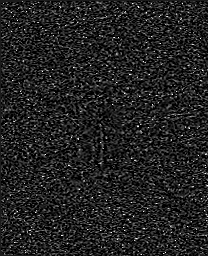

[Series 14: swi_images · axial · 3.0mm · 0.90mm/px · z∈[-61,+115]mm · 5 of 60 slices shown]
[im 1/60]
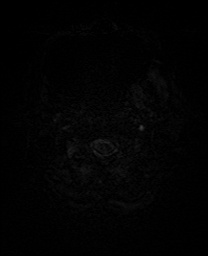
[im 15/60]
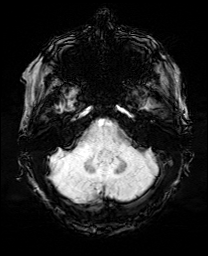
[im 30/60]
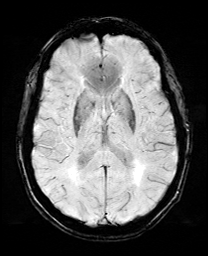
[im 45/60]
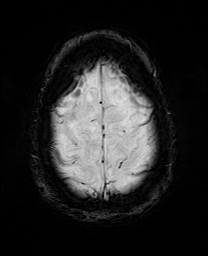
[im 60/60]
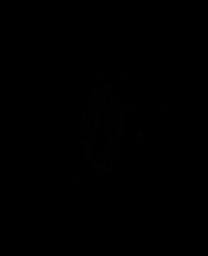

[Series 17: T2 post-contrast · coronal · 5.0mm · 0.72mm/px · 2 of 28 slices shown]
[im 1/28]
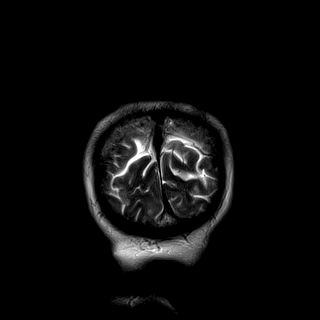
[im 28/28]
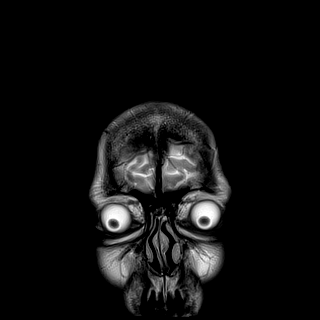

[Series 19: T1 post-contrast · coronal · 5.0mm · 0.34mm/px · 2 of 28 slices shown (1 of 2)]
[im 1/28]
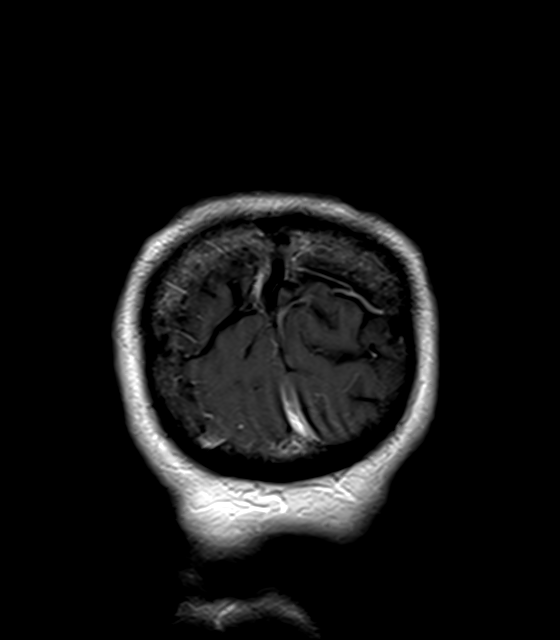
[im 28/28]
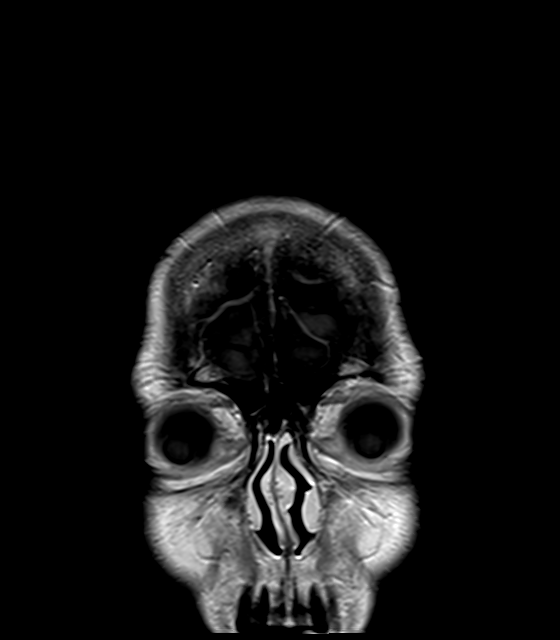

[Series 20: T1 post-contrast · sagittal · 5.0mm · 0.72mm/px · 2 of 23 slices shown (2 of 2)]
[im 1/23]
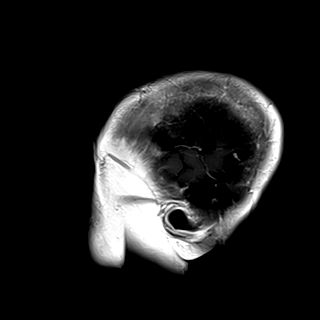
[im 23/23]
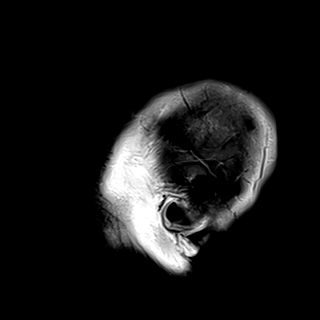

[38 of 48 positions shown; findings below may reference images not displayed]

FINDINGS: Brain: No acute infarct, mass effect or extra-axial collection. No
acute or chronic hemorrhage. There is multifocal hyperintense
T2-weighted signal within the white matter. Parenchymal volume and
CSF spaces are normal. The midline structures are normal. There is
no abnormal contrast enhancement.

Vascular: Major flow voids are preserved.

Skull and upper cervical spine: Normal calvarium and skull base.
Visualized upper cervical spine and soft tissues are normal.

Sinuses/Orbits:No paranasal sinus fluid levels or advanced mucosal
thickening. No mastoid or middle ear effusion. Normal orbits.
IMPRESSION: 1. No acute intracranial abnormality.
2. Multifocal hyperintense T2-weighted signal within the white
matter, most consistent with chronic microvascular ischemia.

## 2021-06-15 MED ORDER — GADOBUTROL 1 MMOL/ML IV SOLN
7.5000 mL | Freq: Once | INTRAVENOUS | Status: AC | PRN
  Administered 2021-06-15: 7.5 mL via INTRAVENOUS

## 2021-06-15 MED ORDER — SODIUM CHLORIDE 0.9% FLUSH
3.0000 mL | Freq: Once | INTRAVENOUS | Status: AC
  Administered 2021-06-15: 3 mL via INTRAVENOUS

## 2021-06-15 NOTE — ED Triage Notes (Signed)
Pt here via EMS due to syncope episode. Pt confused with repetitive questioning. Family reports second occurrence of this type within last year. Pt alert and oriented on arrival

## 2021-06-15 NOTE — ED Notes (Signed)
Pt returned from MRI at this time

## 2021-06-15 NOTE — ED Provider Notes (Signed)
[facility] EMERGENCY DEPARTMENT Provider Note   CSN: [id number] Arrival date & time: 06/15/21  1545  An emergency department physician performed an initial assessment on this suspected stroke patient at 1555.  History  Chief Complaint  Patient presents with   Code Stroke    Kelly [name] is a 76 y.o. 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  The patient was in her usual state of health when she went to go cut cantaloupe in the kitchen.  That is the last thing she remembers.  The patient is accompanied by her daughter who heard patient fall in the kitchen.  She immediately went to the kitchen and found her mother on the ground unresponsive with her eyes deviated upwards and making repetitive lipsmacking movements.  She was unresponsive for about 30 seconds to a minute and then started to respond to questions.  Although she was responding, she was confused and did not know where she was.  She had a slow return back to her baseline and at the time of my evaluation in the emergency department the patient feels like her normal self.  She denies any recent illnesses, fevers, chills, cough, congestion, sore throat, chest pain, shortness of breath, cough, nausea, vomiting, abdominal pain, urinary symptoms, or changes to bowel movements.  She has no history of seizures although her father had an unspecified seizure disorder and took Dilantin.    The patient's daughter states that she had another transient episode similar to this within the past few months where they were sitting watching TV and all of a sudden her mother started having garbled speech and was not making any sense.  She says this lasted for few minutes and her mother slowly returned to her baseline.  The patient did not seek medical attention at that time.     Home  Medications Prior to Admission medications   Medication Sig Start Date End Date Taking? Authorizing Provider  acetaminophen (TYLENOL) 500 MG tablet Take 1,500 mg by mouth daily as needed for mild pain or headache.   Yes [provider]  calcium carbonate (TUMS - DOSED IN MG ELEMENTAL CALCIUM) 500 MG chewable tablet Chew 1-2 tablets by mouth as needed for indigestion or heartburn.   Yes [provider]  colestipol (COLESTID) 1 g tablet Take 1 g by mouth daily as needed (for bowel control).   Yes [provider]  ELIQUIS 5 MG TABS tablet Take 5 mg by mouth in the morning and at bedtime.   Yes [provider]  gabapentin (NEURONTIN) 300 MG capsule Take 300 mg by mouth See admin instructions. Take 300 mg by mouth midday and at bedtime 01/09/21  Yes [provider]  hydrocortisone (ANUSOL-HC) 2.5 % rectal cream Place 1 application rectally See admin instructions. Apply one to two times a day for hemorrhoids 01/17/21  Yes [provider]  APIXABAN Arne [place]) VTE STARTER PACK (10MG  AND 5MG ) Take as directed on package: start with two-5mg  tablets twice daily for 7 days. On day 8, switch to one-5mg  tablet twice daily. Patient not taking: Reported on 06/15/2021 01/28/21   Terrilee Croak, MD      Allergies    Sulfa antibiotics    Review of Systems   Review of Systems  Constitutional:  Negative for chills and fever.  HENT:  Negative for congestion.   Eyes:  Negative for visual disturbance.  Respiratory:  Negative for cough and shortness of breath.   Cardiovascular:  Negative for chest pain.  Gastrointestinal:  Negative for abdominal pain, constipation, diarrhea, nausea and vomiting.  Genitourinary:  Negative for dysuria and frequency.  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 Negative for facial asymmetry, weakness, light-headedness and headaches.  Psychiatric/Behavioral:  Positive for confusion.     Physical Exam Updated Vital Signs BP (!) 151/58    Pulse 74    Temp 98.5 F (36.9 C) (Oral)    Resp 20    Ht 5\' 5"  (1.651 m)    Wt 74 kg    SpO2 99%    BMI 27.15 kg/m   Physical Exam Vitals and nursing note reviewed.  Constitutional:      General: She is not in acute distress.    Appearance: She is well-developed.  HENT:     Head: Normocephalic and atraumatic.     Right Ear: External ear normal.     Left Ear: External ear normal.     Nose: Nose normal.     Mouth/Throat:     Pharynx: Oropharynx is clear.  Eyes:     Extraocular Movements: Extraocular movements intact.     Conjunctiva/sclera: Conjunctivae normal.     Pupils: Pupils are equal, round, and reactive to light.  Cardiovascular:     Rate and Rhythm: Normal rate and regular rhythm.     Heart sounds: No murmur heard. Pulmonary:     Effort: Pulmonary effort is normal. No respiratory distress.     Breath sounds: Normal breath sounds.  Abdominal:     Palpations: Abdomen is soft.     Tenderness: There is no abdominal tenderness.  Musculoskeletal:        General: No swelling.     Cervical back: Neck supple.  Skin:    General: Skin is warm and dry.     Capillary Refill: Capillary refill takes less than 2 seconds.  Neurological:     General: No focal deficit present.     Mental Status: She is alert and oriented to person, place, and time.     GCS: GCS eye subscore is 4. GCS verbal subscore is 5. GCS motor subscore is 6.     Cranial Nerves: Cranial nerves 2-12 are intact.     Sensory: Sensation is intact.     Motor: Motor function is intact.     Coordination: Coordination is intact. Coordination normal. Finger-Nose-Finger Test normal.  Psychiatric:        Mood and Affect: Mood normal.    ED Results / Procedures / Treatments   Labs (all labs ordered are listed, but only abnormal results are displayed) Labs Reviewed  COMPREHENSIVE METABOLIC PANEL - Abnormal; Notable for the following components:      Result Value    Glucose, Bld 138 (*)    Calcium 8.8 (*)    All other components within normal limits  I-STAT CHEM 8, ED - Abnormal; Notable for the following components:   Glucose, Bld 134 (*)    Calcium, Ion 1.11 (*)    All other components within normal limits  CBG MONITORING, ED - Abnormal; Notable for the following components:   Glucose-Capillary 146 (*)    All other components within normal limits  PROTIME-INR  APTT  CBC  DIFFERENTIAL  MAGNESIUM  TROPONIN I (HIGH SENSITIVITY)    EKG EKG Interpretation  Date/Time:  Saturday June 15 2021 16:21:56 EST  Ventricular Rate:  82 PR Interval:  144 QRS Duration: 98 QT Interval:  422 QTC Calculation: 429 R Axis:   74 Text Interpretation: Sinus rhythm Atrial premature complexes Borderline repolarization abnormality Confirmed by Thamas Jaegers ([id number]) on 06/15/2021 4:31:49 PM  Radiology CT HEAD CODE STROKE WO CONTRAST  Result Date: 06/15/2021 CLINICAL DATA:  Code stroke. EXAM: CT HEAD WITHOUT CONTRAST TECHNIQUE: Contiguous axial images were obtained from the base of the skull through the vertex without intravenous contrast. RADIATION DOSE REDUCTION: This exam was performed according to the departmental dose-optimization program which includes automated exposure control, adjustment of the mA and/or kV according to patient size and/or use of iterative reconstruction technique. COMPARISON:  None. FINDINGS: Brain: There is no acute intracranial hemorrhage, mass effect, or edema. Gray-white differentiation is preserved. Ventricles and sulci are within normal limits in size and configuration. Patchy hypoattenuation in the supratentorial white matter is nonspecific but may reflect mild chronic microvascular ischemic changes. No extra-axial collection. Vascular: No hyperdense vessel. There is intracranial atherosclerotic calcification at the skull base. Skull: Unremarkable. Sinuses/Orbits: Small left anterior ethmoid osteoma. Orbits are unremarkable. Other:  Mastoid air cells are clear. ASPECTS ([facility] Stroke Program Early CT Score) - Ganglionic level infarction (caudate, lentiform nuclei, internal capsule, insula, M1-M3 cortex): 7 - Supraganglionic infarction (M4-M6 cortex): 3 Total score (0-10 with 10 being normal): 10 IMPRESSION: There is no acute intracranial hemorrhage or evidence of acute infarction. ASPECT score is 10. These results were communicated to Dr. Cheral Marker at 4:08 pm on 06/15/2021 by text page via the [facility] messaging system. Electronically Signed   By: Macy Mis M.D.   On: 06/15/2021 16:10    Procedures .Critical Care E&M Performed by: Varney Baas, MD  Critical care provider statement:    Critical care time (minutes):  45   Critical care time was exclusive of:  Separately billable procedures and treating other patients   Critical care was necessary to treat or prevent imminent or life-threatening deterioration of the following conditions: Stroke.   Critical care was time spent personally by me on the following activities:  Discussions with consultants, examination of patient, obtaining history from patient or surrogate, review of old charts, ordering and review of laboratory studies and ordering and review of radiographic studies   Care discussed with: admitting provider   After initial E/M assessment, critical care services were subsequently performed that were exclusive of separately billable procedures or treatment.      Medications Ordered in ED Medications  sodium chloride flush (NS) 0.9 % injection 3 mL (3 mLs Intravenous Given 06/15/21 1702)    ED Course/ Medical Decision Making/ A&P                           Patient presents after transient episode of altered mental status as described in HPI above.  On my evaluation, patient has returned to her baseline as described in physical exam above.  She is fully alert and oriented with no focal neurologic deficits on exam.  Labs and imaging reviewed by myself.  CBC  completely normal.  Metabolic panel without significant abnormalities.  CT code stroke without acute intracranial abnormality.  Presentation is most concerning for syncope with postevent confusion versus seizure and postictal state.  Patient has no history of seizures and would be unusual to develop a primary seizure disorder at her age.  I discussed the patient with neurology who recommended admission for observation overnight, MRI brain, and further syncope work-up.  I discussed the patient with family medicine who will admit the patient.  Final Clinical Impression(s) / ED Diagnoses Final diagnoses:  Syncope and collapse    Rx / DC Orders ED Discharge Orders     None         Zane Pellecchia, Amalia Hailey, MD 06/16/21 1249    Luna Fuse, MD 06/16/21 2221

## 2021-06-15 NOTE — ED Provider Notes (Signed)
.  Critical Care Performed by: Luna Fuse, MD Authorized by: Luna Fuse, MD   Critical care provider statement:    Critical care time (minutes):  30   Critical care time was exclusive of:  Separately billable procedures and treating other patients and teaching time   Critical care was necessary to treat or prevent imminent or life-threatening deterioration of the following conditions:  CNS failure or compromise Comments:     Acute stroke activation.    Luna Fuse, MD 06/15/21 2011

## 2021-06-15 NOTE — H&P (Signed)
[facility] Admission History and Physical Service Pager: [phone]  Patient name: Kelly [name] Medical record number: [id number] Date of birth: 1945-08-12 Age: 76 y.o. Gender: female  Primary Care Provider: Katherina Mires, MD Consultants: Neurology Code Status: Full Preferred Emergency Contact: Maxie Better, daughter, [phone]  Chief Complaint: Unwitnessed fall  Assessment and Plan: Kelly [name] is a 76 y.o. female presenting with unwitnessed fall with suspected postictal state. 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  Unwitnessed fall Patient presented to ED via EMS after unwitnessed fall with questionable postictal-like confusion afterwards that resolved spontaneously.  In ED, she was A&O x4 with moderate hypertension and otherwise stable vital signs.  CMP, CBC, magnesium unremarkable.  First troponin 8, currently awaiting second.  CT head and MRI head negative for acute findings.  Neurology consulted, plan to do EEG.  They recommend initiating cardiac work-up for syncope.  Differential at this time remains broad, but includes arrhythmia, orthostasis, seizure activity, medication effects.  Arrhythmia less likely as she is currently sinus rhythm with frequent PACs and has been referred for diagnosis arrhythmia.  Orthostatics possible, however patient currently symptomatic.  Seizure activity is possible given postictal like state of confusion afterwards and family history of father having adult onset epilepsy.  Medication affect much less likely as patient takes very little medication and does not have any new medications the last couple weeks. - Admit to med telemetry floor, Dr. 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  Neuropathy began August 2022, was started on 300 mg gabapentin twice daily by oncology.  IVC thrombus Identified on CT abdomen pelvis 01/26/2021.  Noted to be a nonobstructive thrombus within the IVC extending into right common iliac vein. Home meds include Eliquis 5 mg twice daily. - Continue Eliquis 5 twice daily  Hx left kidney mass Previously seen by urology for left kidney mass presumed to be renal cell carcinoma.  This mass is identified on CT abdomen pelvis 01/26/2021, measured 11 mm. Reported to be too small for biopsy and urology had recommended follow-up in 6 months.  Hx COPD No home meds listed.  FEN/GI: Regular Prophylaxis: Eliquis  Disposition: Med tele  History of Present Illness:  Kelly [name] is a 76 y.o. female presenting after syncopal episode in her kitchen earlier today.  She was attempting to cut a cantaloupe and then woke up on the couch with EMTs in front of her asking how she was doing.  She lives with her daughter, who heard a thud from the other room and found her mother on the kitchen floor.  Ms. Couse relayed that her daughter had told her afterwards that she had some odd lipsmacking and difficulty remembering what happened or answering questions for the EMTs.  She denies any prodromal symptoms.  Denies dizziness, nausea, diaphoresis.  She notes that she does sometimes gets swimmy-headed but that this did not happen prior to her falling.  This sort of episode has never happened to her before.  Review Of Systems: Per HPI with the following additions:   Review of Systems  Constitutional:  Negative for diaphoresis, fatigue and fever.  Respiratory:  Negative for shortness of breath.   Cardiovascular:  Negative for chest pain.  Psychiatric/Behavioral:  Positive for  confusion. Negative for hallucinations.     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 2022   COPD (chronic obstructive pulmonary disease) ([place])    Past Surgical History: Past Surgical History:  Procedure Laterality Date   ABDOMINAL HYSTERECTOMY  06/2010   bladder tack     CHOLECYSTECTOMY N/A 10/26/2015   Procedure: LAPAROSCOPIC CHOLECYSTECTOMY WITH INTRAOPERATIVE CHOLANGIOGRAM;  Surgeon: Michael Boston, MD;  Location: [facility];  Service: General;  Laterality: N/A;   DILATION AND CURETTAGE OF UTERUS  06/2010   ERCP N/A 10/25/2015   Procedure: ENDOSCOPIC RETROGRADE CHOLANGIOPANCREATOGRAPHY (ERCP);  Surgeon: Milus Banister, MD;  Location: [facility];  Service: Endoscopy;  Laterality: N/A;   HERNIA REPAIR     inguinal hernia repair - at 76 years old   TONSILLECTOMY     Social History: Social History   Tobacco Use   Smoking status: Former    Types: Cigarettes    Quit date: 06/02/2009    Years since quitting: 12.0   Smokeless tobacco: Never  Substance Use Topics   Alcohol use: Not Currently    Alcohol/week: 1.0 standard drink    Types: 1 Glasses of wine per week   Drug use: No   Additional social history: none Please also refer to relevant sections of EMR.  Family History: Family History  Problem Relation Age of Onset   Heart disease Mother    Dementia Father    Allergies and Medications: Allergies  Allergen Reactions   Sulfa Antibiotics Rash   No current facility-administered medications on file prior to encounter.   Current Outpatient Medications on File Prior to Encounter   Medication Sig Dispense Refill   acetaminophen (TYLENOL) 500 MG tablet Take 1,500 mg by mouth daily as needed for mild pain or headache.     calcium carbonate (TUMS - DOSED IN MG ELEMENTAL CALCIUM) 500 MG chewable tablet Chew 1-2 tablets by mouth as needed for indigestion or heartburn.     colestipol (COLESTID) 1 g tablet Take 1 g by mouth daily as needed (for bowel control).     ELIQUIS 5 MG TABS tablet Take 5 mg by mouth in the morning and at bedtime.     gabapentin (NEURONTIN) 300 MG capsule Take 300 mg by mouth See admin instructions. Take 300 mg by mouth midday and at bedtime     hydrocortisone (ANUSOL-HC) 2.5 % rectal cream Place 1 application rectally See admin instructions. Apply one to two times a day for hemorrhoids     APIXABAN (ELIQUIS) VTE STARTER PACK (10MG  AND 5MG ) Take as directed on package: start with two-5mg  tablets twice daily for 7 days. On day 8, switch to one-5mg  tablet twice daily. (Patient not taking: Reported on 06/15/2021) 1 each 0    Objective: BP (!) 167/77    Pulse 63    Temp 98.5 F (36.9 C) (Oral)    Resp 18    Ht 5\' 5"  (1.651 m)    Wt 74 kg    SpO2 94%  BMI 27.15 kg/m  Exam: General: Awake, alert, oriented, no acute distress Eyes: EOM intact, PERRL ENTM: Sclera anicteric, nose clear, mouth without lesions, uvula midline Neck: Normal appearing Cardiovascular: Irregular rhythm, no murmurs appreciated Respiratory: Clear to auscultation bilaterally Gastrointestinal: Bowel sounds present MSK: Muscle tone and bulk normal and symmetric Derm: No rashes or lesions Neuro: Cranial nerves II through X grossly intact, equal shoulder shrug, BUE and BLE strength 5/5 and equal bilaterally, grip strength 5/5 and equal bilaterally Psych: Appropriate affect  Labs and Imaging: CBC BMET  Recent Labs  Lab 06/15/21 1547 06/15/21 1558  WBC 8.9  --   HGB 14.3 14.3  HCT 43.1 42.0  PLT 186  --    Recent Labs  Lab 06/15/21 1547 06/15/21 1558  NA 139 142  K 3.5 3.5   CL 106 105  CO2 24  --   BUN 18 19  CREATININE 0.94 0.90  GLUCOSE 138* 134*  CALCIUM 8.8*  --      EKG: Regular sinus rhythm with frequent PACs, rate 82, QTc 429, no ST segment elevation  CT HEAD WITHOUT CONTRAST 06/15/2021 IMPRESSION: There is no acute intracranial hemorrhage or evidence of acute infarction. ASPECT score is 10. ASPECTS [facility] Stroke Program Early CT Score) - Ganglionic level infarction (caudate, lentiform nuclei, internal capsule, insula, M1-M3 cortex): 7 - Supraganglionic infarction (M4-M6 cortex): 3 Total score (0-10 with 10 being normal): 10  MRI HEAD WITHOUT AND WITH CONTRAST IMPRESSION: 1. No acute intracranial abnormality. 2. Multifocal hyperintense T2-weighted signal within the white matter, most consistent with chronic microvascular ischemia.   Ezequiel Essex, MD 06/16/2021, 12:22 AM PGY-2, [facility] Intern pager: [phone], text pages welcome

## 2021-06-15 NOTE — ED Notes (Signed)
Pt transported to MRI 

## 2021-06-15 NOTE — ED Notes (Signed)
Spoke with admit advised pt could eat. Pt taken something to drink and a [place] sandwich at this time

## 2021-06-15 NOTE — Progress Notes (Signed)
Family medicine teaching service will be admitting this patient. Our pager information can be located in the physician sticky notes, treatment team sticky notes, and the headers of all our official daily progress notes.   FAMILY MEDICINE TEACHING SERVICE Patient - Please contact intern pager [phone] or text page via website [email or web address]) for questions regarding care. DO NOT page listed attending provider unless there is no answer from the number above.   Kelly Mckibbin, MD PGY-2, [facility] pager [phone]   

## 2021-06-15 NOTE — Consult Note (Signed)
NEURO HOSPITALIST CONSULT NOTE   Requestig physician: Dr. Almyra Free  Reason for Consult: Loss of consciousness in association with fall, followed by confusion and memory loss, now resolving.   History obtained from:  EMS and Chart initially. Patient later able to answer questions regarding her history.   HPI:                                                                                                                                          Kelly [name] is an 76 y.o. female presenting via EMS after brief loss of consciousness at home in association with a visually unwitnessed fall, followed by confusion and memory loss which were gradually resolving at the time of arrival to the ED.   On arrival to the ED the patient was conversant but unable to recall any of the events of the day nor of the week before, but did have intact remote memory, as well as memories beginning after the arrival of EMS at her home, including the ambulance ride over to the hospital. She states that her first memory was being on the couch at home, which her family had brought her over to after her fall in the kitchen, "surrounded by all these handsome men". Fall had been visually unwitnessed but had been heard by family in a nearby room as a loud thumping sound. She had been cutting fruit in the kitchen right before the fall. She seemed to have briefly lost consciousness. She apparently had no evidence of head or other bodily trauma. No seizure activity or urinary/bowel incontinence. No preceding symptoms were related to EMS by family. She was not weak, aphasic or with facial droop on EMS arrival.   She denied headache or other pain on arrival to the ED. Also denied any weakness. She endorsed chronic bilateral hand and lower leg/foot numbness with paresthesias due to recent chemotherapy.   She has a port in her right chest region which was retained after she completed chemotherapy for colon cancer last  January. She did have a portion of her colon resected as part of the treatment. Her PMHx also includes COPD, choledocholithiasis and acute gallstone pancreatitis s/p laparoscopic cholecystectomy in 2017.     Home medications include Eliquis.   Past Medical History:  Diagnosis Date   Acute gallstone pancreatitis s/p lap cholecystectomy 10/26/2015 10/24/2015   Choledocholithiasis 10/24/2015   Colon cancer [facility])    with surgery in Jan. 2022   COPD (chronic obstructive pulmonary disease) ([place])     Past Surgical History:  Procedure Laterality Date   ABDOMINAL HYSTERECTOMY  06/2010   bladder tack     CHOLECYSTECTOMY N/A 10/26/2015   Procedure: LAPAROSCOPIC CHOLECYSTECTOMY WITH INTRAOPERATIVE CHOLANGIOGRAM;  Surgeon: Michael Boston, MD;  Location: [facility];  Service: General;  Laterality: N/A;   DILATION AND CURETTAGE OF UTERUS  06/2010   ERCP N/A 10/25/2015   Procedure: ENDOSCOPIC RETROGRADE CHOLANGIOPANCREATOGRAPHY (ERCP);  Surgeon: Milus Banister, MD;  Location: [facility];  Service: Endoscopy;  Laterality: N/A;   HERNIA REPAIR     inguinal hernia repair - at 76 years old   TONSILLECTOMY      Family History  Problem Relation Age of Onset   Heart disease Mother    Dementia Father              Social History:  reports that she quit smoking about 12 years ago. Her smoking use included cigarettes. She has never used smokeless tobacco. She reports that she does not currently use alcohol after a past usage of about 1.0 standard drink per week. She reports that she does not use drugs.  Allergies  Allergen Reactions   Sulfa Antibiotics Rash    HOME MEDICATIONS:                                                                                                                      No current facility-administered medications on file prior to encounter.   Current Outpatient Medications on File Prior to Encounter  Medication Sig Dispense Refill   acetaminophen (TYLENOL) 500 MG tablet Take 1,500 mg  by mouth daily as needed for mild pain or headache.     calcium carbonate (TUMS - DOSED IN MG ELEMENTAL CALCIUM) 500 MG chewable tablet Chew 1-2 tablets by mouth as needed for indigestion or heartburn.     colestipol (COLESTID) 1 g tablet Take 1 g by mouth daily as needed (for bowel control).     ELIQUIS 5 MG TABS tablet Take 5 mg by mouth in the morning and at bedtime.     gabapentin (NEURONTIN) 300 MG capsule Take 300 mg by mouth See admin instructions. Take 300 mg by mouth midday and at bedtime     hydrocortisone (ANUSOL-HC) 2.5 % rectal cream Place 1 application rectally See admin instructions. Apply one to two times a day for hemorrhoids     APIXABAN (ELIQUIS) VTE STARTER PACK (10MG  AND 5MG ) Take as directed on package: start with two-5mg  tablets twice daily for 7 days. On day 8, switch to one-5mg  tablet twice daily. (Patient not taking: Reported on 06/15/2021) 1 each 0     ROS:  As per HPI. Does not endorse any additional complaints.    Blood pressure (!) 151/58, pulse 74, temperature 98.5 F (36.9 C), temperature source Oral, resp. rate 20, height 5\' 5"  (1.651 m), weight 74 kg, SpO2 99 %.   General Examination:                                                                                                       Physical Exam  HEENT-  [place]/AT. No evidence for laceration, bruising or subcutaneous hematoma on examination of scalp, face and neck. Lungs- Respirations unlabored Extremities- No edema  Neurological Examination Mental Status:  Initially with mildly decreased level of alertness and impaired memory for recent events. After CT the patient improved and was alert and oriented x 5 with excellent situational awareness and insight. Speech fluent without evidence of aphasia.  Able to follow all commands without difficulty. Cranial Nerves: II: PERRL.  Temporal visual fields intact with no extinction to DSS.   III,IV, VI: No ptosis. EOMI. No nystagmus.  V,VII: Smile symmetric, facial temp sensation normal bilaterally VIII: Hearing intact to voice IX,X: No hypophonia XI: Symmetric XII: Midline tongue extension Motor: Right : Upper extremity   5/5    Left:     Upper extremity   5/5  Lower extremity   5/5     Lower extremity   5/5 No pronator drift Sensory: Temp and light touch intact throughout, bilaterally. No extinction to DSS.  Deep Tendon Reflexes: 2+ and symmetric throughout Plantars: Right: downgoing   Left: downgoing Cerebellar: No ataxia with FNF and H-S bilaterally  Gait: Deferred   Lab Results: Basic Metabolic Panel: Recent Labs  Lab 06/15/21 1547 06/15/21 1558  NA 139 142  K 3.5 3.5  CL 106 105  CO2 24  --   GLUCOSE 138* 134*  BUN 18 19  CREATININE 0.94 0.90  CALCIUM 8.8*  --     CBC: Recent Labs  Lab 06/15/21 1547 06/15/21 1558  WBC 8.9  --   NEUTROABS 6.2  --   HGB 14.3 14.3  HCT 43.1 42.0  MCV 90.4  --   PLT 186  --     Cardiac Enzymes: No results for input(s): CKTOTAL, CKMB, CKMBINDEX, TROPONINI in the last 168 hours.  Lipid Panel: No results for input(s): CHOL, TRIG, HDL, CHOLHDL, VLDL, LDLCALC in the last 168 hours.  Imaging: CT HEAD CODE STROKE WO CONTRAST  Result Date: 06/15/2021 CLINICAL DATA:  Code stroke. EXAM: CT HEAD WITHOUT CONTRAST TECHNIQUE: Contiguous axial images were obtained from the base of the skull through the vertex without intravenous contrast. RADIATION DOSE REDUCTION: This exam was performed according to the departmental dose-optimization program which includes automated exposure control, adjustment of the mA and/or kV according to patient size and/or use of iterative reconstruction technique. COMPARISON:  None. FINDINGS: Brain: There is no acute intracranial hemorrhage, mass effect, or edema. Gray-white differentiation is preserved. Ventricles and sulci are within normal  limits in size and configuration. Patchy hypoattenuation in the supratentorial white matter is nonspecific but may reflect mild chronic microvascular ischemic changes.  No extra-axial collection. Vascular: No hyperdense vessel. There is intracranial atherosclerotic calcification at the skull base. Skull: Unremarkable. Sinuses/Orbits: Small left anterior ethmoid osteoma. Orbits are unremarkable. Other: Mastoid air cells are clear. ASPECTS ([place] Stroke Program Early CT Score) - Ganglionic level infarction (caudate, lentiform nuclei, internal capsule, insula, M1-M3 cortex): 7 - Supraganglionic infarction (M4-M6 cortex): 3 Total score (0-10 with 10 being normal): 10 IMPRESSION: There is no acute intracranial hemorrhage or evidence of acute infarction. ASPECT score is 10. These results were communicated to Dr. Cheral Marker at 4:08 pm on 06/15/2021 by text page via the [facility] messaging system. Electronically Signed   By: Macy Mis M.D.   On: 06/15/2021 16:10     Assessment: 76 y.o. female with a history of HTN, DVT (on Eliquis), colon cancer s/p resection and chemotherapy and chemotherapy related peripheral neuropathy, who presents to the ED via EMS after brief loss of consciousness at home in association with a visually unwitnessed fall, followed by confusion and memory loss which were gradually resolving at the time of arrival to the ED.  - Neurological exam after CT was nonfocal, without motor deficit or ataxia, with fully intact language and comprehension, and with rapidly resolving memory deficits that had been present at the time of presentation to the ED.  - CT head:  There is no acute intracranial hemorrhage or evidence of acute infarction. ASPECT score is 10.  - DDx includes first time unwitnessed seizure leading to fall and postictal confusion and syncope with unusual post-event manifestation of confusion. Stroke is felt to be unlikely.    Recommendations: - Observe overnight.  - Obtain MRI brain.  -  Orthostatics.  - Obtain cardiac work up as etiology for her falling and losing consciousness could be an arrhythmia.  - Seizure unlikely but will need EEG.   Electronically signed: Dr. Kerney Elbe 06/15/2021, 7:07 PM

## 2021-06-15 NOTE — ED Notes (Signed)
Ambulated to restroom without assistance at this time 

## 2021-06-16 ENCOUNTER — Observation Stay (HOSPITAL_COMMUNITY): Payer: Medicare PPO

## 2021-06-16 ENCOUNTER — Inpatient Hospital Stay (HOSPITAL_BASED_OUTPATIENT_CLINIC_OR_DEPARTMENT_OTHER): Payer: Medicare PPO

## 2021-06-16 DIAGNOSIS — R55 Syncope and collapse: Secondary | ICD-10-CM | POA: Diagnosis not present

## 2021-06-16 LAB — BASIC METABOLIC PANEL
Anion gap: 8 (ref 5–15)
BUN: 16 mg/dL (ref 8–23)
CO2: 24 mmol/L (ref 22–32)
Calcium: 8.7 mg/dL — ABNORMAL LOW (ref 8.9–10.3)
Chloride: 109 mmol/L (ref 98–111)
Creatinine, Ser: 0.82 mg/dL (ref 0.44–1.00)
GFR, Estimated: >60 mL/min (ref >60)
Glucose, Bld: 91 mg/dL (ref 70–99)
Potassium: 3.5 mmol/L (ref 3.5–5.1)
Sodium: 141 mmol/L (ref 135–145)

## 2021-06-16 LAB — LIPID PANEL
Cholesterol: 166 mg/dL (ref 0–200)
HDL: 52 mg/dL (ref >40)
LDL Cholesterol: 89 mg/dL (ref 0–99)
Total CHOL/HDL Ratio: 3.2 RATIO
Triglycerides: 126 mg/dL (ref <150)
VLDL: 25 mg/dL (ref 0–40)

## 2021-06-16 LAB — ECHOCARDIOGRAM COMPLETE
Area-P 1/2: 3.65 cm2
Height: 65 in
S' Lateral: 2.5 cm
Weight: 2610.25 oz

## 2021-06-16 LAB — TROPONIN I (HIGH SENSITIVITY): Troponin I (High Sensitivity): 6 ng/L (ref <18)

## 2021-06-16 LAB — HEMOGLOBIN A1C
Hgb A1c MFr Bld: 5.9 % — ABNORMAL HIGH (ref 4.8–5.6)
Mean Plasma Glucose: 122.63 mg/dL

## 2021-06-16 LAB — TSH: TSH: 1.494 u[IU]/mL (ref 0.350–4.500)

## 2021-06-16 MED ORDER — APIXABAN 5 MG PO TABS
5.0000 mg | ORAL_TABLET | Freq: Two times a day (BID) | ORAL | Status: DC
  Administered 2021-06-16 – 2021-06-17 (×3): 5 mg via ORAL
  Filled 2021-06-16 (×3): qty 1

## 2021-06-16 MED ORDER — ACETAMINOPHEN 325 MG PO TABS: 650.0000 mg | ORAL_TABLET | Freq: Every day | ORAL | Status: DC | PRN

## 2021-06-16 MED ORDER — POLYETHYLENE GLYCOL 3350 17 G PO PACK
17.0000 g | PACK | Freq: Every day | ORAL | Status: DC
  Administered 2021-06-16: 17 g via ORAL
  Filled 2021-06-16: qty 1

## 2021-06-16 MED ORDER — SODIUM CHLORIDE 0.9% FLUSH
3.0000 mL | Freq: Two times a day (BID) | INTRAVENOUS | Status: DC
  Administered 2021-06-16: 3 mL via INTRAVENOUS

## 2021-06-16 MED ORDER — GABAPENTIN 300 MG PO CAPS
300.0000 mg | ORAL_CAPSULE | ORAL | Status: DC
  Administered 2021-06-16 (×2): 300 mg via ORAL
  Filled 2021-06-16 (×2): qty 1

## 2021-06-16 MED ORDER — SODIUM CHLORIDE 0.9 % IV SOLN: 250.0000 mL | INTRAVENOUS | Status: DC | PRN

## 2021-06-16 MED ORDER — CALCIUM CARBONATE ANTACID 500 MG PO CHEW: 1.0000 | CHEWABLE_TABLET | Freq: Three times a day (TID) | ORAL | Status: DC | PRN

## 2021-06-16 MED ORDER — SODIUM CHLORIDE 0.9% FLUSH: 3.0000 mL | INTRAVENOUS | Status: DC | PRN

## 2021-06-16 NOTE — Procedures (Signed)
[facility] TeleNeurology Consult Services  Routine EEG Report  Patient Name:   Kelly, [name] Date of Birth:   1945-12-07 Identification Number:   MRN - [id number]  Date of Study:   06/16/2021 16:35:10  Duration: 21 min  Indication: Encephalopathy,  Technical Summary: A routine 20 channel electroencephalogram using the international 10-20 system of electrode placement was performed.  Background: 9-10 Hz, Posterior dominant rhythm that attenuated with eye Opening  States       Awake  Abnormalities  Focal Slowing: There was intermittent right temporal slowing.   Activation Procedures  Hyperventilation: Not performed  Photic Stimulation: Not performed  Classification: Abnormal :  Diagnosis: This is an abnormal EEG due to right temporal slowing concerning for underlying neuronal dysfunction or an area of increased epileptogenicity.      Dr Tsosie Billing   [facility] [phone]  Case [id number]

## 2021-06-16 NOTE — ED Notes (Signed)
Ambulated to restroom with NT at side at this time

## 2021-06-16 NOTE — ED Notes (Signed)
Patient transported to Ultrasound for ECHO

## 2021-06-16 NOTE — Progress Notes (Signed)
FPTS Brief Progress Note  S:Received page that patient was leaving in next 30 min, and wanting to talk to team. Martin Majestic and saw patient.  Patient with family member.  Patient and family member expressed interest in leaving, felt like they had not seen doctors or heard any news about results from earlier in the day. Also felt like they were keeping a bed from someone more sick.   Told family echocardiogram, and electrolytes were normal. Told family that we are still awaiting results of EEG to be read, and also waiting for PT, OT evaluation.   Expressed to family that we would like them to wait for PT OT evaluation to determine if there were any weaknesses or issues in muscles and joints that could be causing the falls.    Patient and family member agreeable and decided to stay.  Told family we will attempt to make them early discharge if all things were normal in the morning and we were able to have PT and OT see them.   O: BP (!) 153/66    Pulse 74    Temp 98.5 F (36.9 C) (Oral)    Resp (!) 25    Ht 5\' 5"  (1.651 m)    Wt 74 kg    SpO2 91%    BMI 27.15 kg/m     A/P: -F/u nerology rec -F/u EEG -F/u PT/OT recs - Orders reviewed. Labs for AM ordered, which was adjusted as needed.   Holley Bouche, MD 06/16/2021, 8:29 PM PGY-1, [place] Night Resident  Please page [phone] with questions.

## 2021-06-16 NOTE — Progress Notes (Signed)
MRI brain:  1. No acute intracranial abnormality. 2. Multifocal hyperintense T2-weighted signal within the white matter, most consistent with chronic microvascular ischemia.  EEG is pending.  Electronically signed: Dr. Kerney Elbe

## 2021-06-16 NOTE — ED Notes (Signed)
Pt's family member came out to nurses station stating pt is ready to leave and that she will leave in the next 30 minutes with or without being told to do so. Admitting paged and made aware.

## 2021-06-16 NOTE — Progress Notes (Signed)
EEG done at bedside. No skin breakdown noted. Results pending. 

## 2021-06-16 NOTE — Progress Notes (Signed)
°  Echocardiogram 2D Echocardiogram has been performed.  Johny Chess 06/16/2021, 9:23 AM

## 2021-06-16 NOTE — Care Management CC44 (Signed)
Condition Code 44 Documentation Completed  Patient Details  Name: Kelly [name] MRN: [id number] Date of Birth: [birth year]   Condition Code 44 given:  Yes Patient signature on Condition Code 44 notice:  Yes Documentation of 2 MD's agreement:    Code 44 added to claim:       Alfredia Ferguson, LCSW 06/16/2021, 10:05 AM

## 2021-06-16 NOTE — Progress Notes (Signed)
Family Medicine Teaching Service Daily Progress Note Intern Pager: [phone]  Patient name: Kelly [name] Medical record number: [id number] Date of birth: 08/20/1945 Age: 76 y.o. Gender: female  Primary Care Provider: Katherina Mires, MD Consultants: Neuro Code Status: Full  Pt Overview and Major Events to Date:  1/14 admitted  Assessment and Plan: Kelly [name] is a 76 year old female who was admitted after an unwitnessed fall with suspected postictal state.  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  Unwitnessed fall MRI negative for acute insult.  Patient reports feeling back to baseline at this time, has no complaints. - Neuro consulted, appreciate ongoing care and recommendations - Awaiting EEG per neuro - Awaiting a.m. orthostatics - Awaiting echo - PT OT eval and treat - Continue delirium precautions   Chemo-related neuropathy Continue gabapentin 300 mg twice daily.   IVC thrombus Continue Eliquis 5 mg daily.  Continue gabapentin 300 mg twice daily.   FEN/GI: Regular PPx: Full dose Eliquis Dispo:Pending PT recommendations  tomorrow. Barriers include continued work-up.   Subjective:  Patient found awake and alert.  She has no complaints.  Reports feeling back to her baseline.  Objective: Temp:  [98.5 F (36.9 C)] 98.5 F (36.9 C) (01/14 1659) Pulse Rate:  [60-83] 67 (01/15 0633) Resp:  [17-28] 17 (01/15 0633) BP: (125-174)/(53-123) 149/62 (01/15 0633) SpO2:  [91 %-99 %] 91 % (01/15 0962) Weight:  [74 kg] 74 kg (01/14 1558) Physical Exam: General: Awake, alert, oriented, no acute distress Cardiovascular: Irregular rhythm, no murmurs appreciated Respiratory: CTA B Extremities: Moving all spontaneously  Laboratory: Recent Labs  Lab 06/15/21 1547 06/15/21 1558  WBC 8.9  --   HGB 14.3 14.3  HCT 43.1 42.0  PLT 186  --    Recent Labs  Lab 06/15/21 1547 06/15/21 1558 06/16/21 0308  NA 139 142 141   K 3.5 3.5 3.5  CL 106 105 109  CO2 24  --  24  BUN 18 19 16   CREATININE 0.94 0.90 0.82  CALCIUM 8.8*  --  8.7*  PROT 6.6  --   --   BILITOT 1.1  --   --   ALKPHOS 78  --   --   ALT 12  --   --   AST 16  --   --   GLUCOSE 138* 134* 91    Imaging/Diagnostic Tests: MRI HEAD WITHOUT AND WITH CONTRAST IMPRESSION: 1. No acute intracranial abnormality. 2. Multifocal hyperintense T2-weighted signal within the white matter, most consistent with chronic microvascular ischemia.   Ezequiel Essex, MD 06/16/2021, 7:17 AM PGY-2, [place] Intern pager: [phone], text pages welcome

## 2021-06-17 DIAGNOSIS — R55 Syncope and collapse: Secondary | ICD-10-CM | POA: Diagnosis not present

## 2021-06-17 DIAGNOSIS — R569 Unspecified convulsions: Secondary | ICD-10-CM

## 2021-06-17 MED ORDER — LEVETIRACETAM 500 MG PO TABS: 500.0000 mg | ORAL_TABLET | Freq: Two times a day (BID) | ORAL | 0 refills | Status: DC

## 2021-06-17 MED ORDER — LEVETIRACETAM 500 MG PO TABS: 500.0000 mg | ORAL_TABLET | Freq: Two times a day (BID) | ORAL | 1 refills | Status: DC

## 2021-06-17 MED ORDER — LEVETIRACETAM 500 MG PO TABS
500.0000 mg | ORAL_TABLET | Freq: Two times a day (BID) | ORAL | Status: DC
  Administered 2021-06-17: 500 mg via ORAL
  Filled 2021-06-17: qty 1

## 2021-06-17 NOTE — Progress Notes (Signed)
[facility] Discharge Summary  Patient name: Kelly [name] Medical record number: [id number] Date of birth: 04-27-1946 Age: 76 y.o. Gender: female Date of Admission: 06/15/2021  Date of Discharge: 06/17/2021 Admitting Physician: Ezequiel Essex, MD  Primary Care Provider: Katherina Mires, MD Consultants: Neurology  Indication for Hospitalization: Fall with Postictal State  Discharge Diagnoses/Problem List:  Syncope  Disposition: Home  Discharge Condition: Stable  Discharge Exam:  General: Nontoxic, sitting in bed comfortably, alert and oriented Cardiovascular: RRR no murmurs rubs or gallops Respiratory: No increased work of breathing, clear to auscultation bilaterally no wheezes rales or crackles Abdomen: Soft, nontender Extremities: No lower extremity edema, moving spontaneously Neuro: No focal neurologic deficits  Brief Hospital Course:  Kelly [name] is a 76 year old female who presented with a and unwitnessed fall with suspected postictal state.  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  Unwitnessed fall Patient had unwitnessed fall with postictal confusions afterwards which resolved spontaneously in the ED.  Cardiac etiology unlikely given echo showed EF of 65 to 70% with grade 1 diastolic dysfunction.  Neurology was consulted and performed EEG. MRI of the brain showed no acute abnormalities.  EEG showed right temporal slowing concerning for underlying neuronal dysfunction or an area of increased epileptogenicity.  Neurology started patient on Keppra 500 mg twice daily and will follow-up with her in 2 to 3 months.  There were no PT or OT recommendations for patient.  Chronic conditions remained stable   Issues for Follow Up:  Neurology started patient on 500 mg Keppra BID, ensure compliance and follow up with neurology clinic in 2-3  months  Significant Procedures: EEG  Significant Labs and Imaging:  Recent Labs  Lab 06/15/21 1547 06/15/21 1558  WBC 8.9  --   HGB 14.3 14.3  HCT 43.1 42.0  PLT 186  --    Recent Labs  Lab 06/15/21 1547 06/15/21 1558 06/15/21 2208 06/16/21 0308  NA 139 142  --  141  K 3.5 3.5  --  3.5  CL 106 105  --  109  CO2 24  --   --  24  GLUCOSE 138* 134*  --  91  BUN 18 19  --  16  CREATININE 0.94 0.90  --  0.82  CALCIUM 8.8*  --   --  8.7*  MG  --   --  2.0  --   ALKPHOS 78  --   --   --   AST 16  --   --   --   ALT 12  --   --   --   ALBUMIN 3.6  --   --   --      Results/Tests Pending at Time of Discharge: None  Discharge Medications:  Allergies as of 06/17/2021       Reactions   Sulfa Antibiotics Rash        Medication List     TAKE these medications    acetaminophen 500 MG tablet Commonly known as: TYLENOL Take 1,500 mg by mouth daily as needed for mild pain or headache.   calcium carbonate 500 MG chewable tablet Commonly known as: TUMS - dosed in mg elemental calcium Chew 1-2 tablets by mouth as needed for indigestion or heartburn.   colestipol 1 g tablet Commonly known as: COLESTID Take 1 g by mouth daily as needed (for bowel control).   Eliquis 5 MG Tabs  tablet Generic drug: apixaban Take 5 mg by mouth in the morning and at bedtime. What changed: Another medication with the same name was removed. Continue taking this medication, and follow the directions you see here.   gabapentin 300 MG capsule Commonly known as: NEURONTIN Take 300 mg by mouth See admin instructions. Take 300 mg by mouth midday and at bedtime   hydrocortisone 2.5 % rectal cream Commonly known as: ANUSOL-HC Place 1 application rectally See admin instructions. Apply one to two times a day for hemorrhoids   levETIRAcetam 500 MG tablet Commonly known as: KEPPRA Take 1 tablet (500 mg total) by mouth 2 (two) times daily.        Discharge Instructions: Please refer to  Patient Instructions section of EMR for full details.  Patient was counseled important signs and symptoms that should prompt return to medical care, changes in medications, dietary instructions, activity restrictions, and follow up appointments.   Follow-Up Appointments:   Gerrit Heck, MD 06/17/2021, 3:21 PM PGY-1, [place]

## 2021-06-17 NOTE — ED Notes (Signed)
Pt ambulated to BR with steady gait and returned to stretcher. Pt very agitated, states she is leaving, not going to stay any additional time here in ED. Pt self removed IV and went to bathroom and got dressed. Steady ambulatory gait and no distress noted. RN spoke to Dr Janus Molder with family medicine who stated they will not be discharging pt due to abnormal EEG and waiting on neuro followup. RN expressed this to the pt, and pt opted to wait for neurology. Family medicine Dr Jinny Sanders at bedside with pt at this time, and OT arrives at the same time, as this RN had paged them at shift change to try and facilitate D/C needs.

## 2021-06-17 NOTE — ED Notes (Signed)
Breakfast orders placed 

## 2021-06-17 NOTE — Progress Notes (Signed)
PT Cancellation Note  Patient Details Name: KARITA DRALLE MRN: [id number] DOB: [birth year]   Cancelled Treatment:    Reason Eval/Treat Not Completed: PT screened, no needs identified, will sign off  Spoke with OT.  Pt independent and at baseline.  Has no skilled PT needs - will sign off. Abran Richard, PT Acute Rehab Services Pager [phone] [facility] [place] 06/17/2021, 9:57 AM

## 2021-06-17 NOTE — Progress Notes (Signed)
NEUROLOGY CONSULTATION PROGRESS NOTE   Date of service: June 17, 2021 Patient Name: Kelly [name] MRN:  [id number] DOB:  11/05/1945  Brief HPI  Kelly [name] is a  76 y.o. female with a history of HTN, DVT (on Eliquis), colon cancer s/p resection and chemotherapy and chemotherapy related peripheral neuropathy, who presents to the ED via EMS after brief loss of consciousness at home in association with a visually unwitnessed fall, followed by confusion and memory loss which were gradually resolving at the time of arrival to the ED.    Interval Hx   Back to her baseline, no acute events. No prior hs of seizures. Father had seizures after a fall and hit his head. No personal hx of significant head injury, no hx of Cns infection or surgery. Only social EtOH use about once a week, does not use recreational substances. Was not sleep deprived.  Vitals   Vitals:   06/16/21 2200 06/17/21 0100 06/17/21 0200 06/17/21 0820  BP: 137/67 (!) 125/51 (!) 101/57 110/70  Pulse: 77 (!) 40 79 80  Resp: (!) 25 (!) 22 12 16   Temp:    98.4 F (36.9 C)  TempSrc:    Oral  SpO2: 91% 93% 93% 98%  Weight:      Height:         Body mass index is 27.15 kg/m.  Physical Exam   General: Laying comfortably in bed; in no acute distress.  HENT: Normal oropharynx and mucosa. Normal external appearance of ears and nose.  Neck: Supple, no pain or tenderness  CV: No JVD. No peripheral edema.  Pulmonary: Symmetric Chest rise. Normal respiratory effort.  Abdomen: Soft to touch, non-tender.  Ext: No cyanosis, edema, or deformity  Skin: No rash. Normal palpation of skin.   Musculoskeletal: Normal digits and nails by inspection. No clubbing.   Neurologic Examination  Mental status/Cognition: Alert, oriented to self, place, month and year, good attention.  Speech/language: Fluent, comprehension intact, object naming intact, repetition intact.  Cranial nerves:   CN II Pupils equal and reactive to light, no VF  deficits    CN III,IV,VI EOM intact, no gaze preference or deviation, no nystagmus    CN V normal sensation in V1, V2, and V3 segments bilaterally    CN VII no asymmetry, no nasolabial fold flattening    CN VIII normal hearing to speech    CN IX & X normal palatal elevation, no uvular deviation    CN XI 5/5 head turn and 5/5 shoulder shrug bilaterally    CN XII midline tongue protrusion    Motor:  Muscle bulk: normal, tone normal  5/5 hand grip BL Can get of chair without assistance and walk.  Coordination/Complex Motor:   - Gait: Stride length normal. Arm swing normal. Base width narrow.  Labs   Basic Metabolic Panel:  Lab Results  Component Value Date   NA 141 06/16/2021   K 3.5 06/16/2021   CO2 24 06/16/2021   GLUCOSE 91 06/16/2021   BUN 16 06/16/2021   CREATININE 0.82 06/16/2021   CALCIUM 8.7 (L) 06/16/2021   GFRNONAA >60 06/16/2021   GFRAA >60 10/27/2015   HbA1c:  Lab Results  Component Value Date   HGBA1C 5.9 (H) 06/16/2021   LDL:  Lab Results  Component Value Date   LDLCALC 89 06/16/2021   Urine Drug Screen: No results found for: LABOPIA, COCAINSCRNUR, LABBENZ, AMPHETMU, THCU, LABBARB  Alcohol Level No results found for: ETH No results found for: PHENYTOIN,  ZONISAMIDE, LAMOTRIGINE, LEVETIRACETA No results found for: PHENYTOIN, PHENOBARB, VALPROATE, CBMZ  Imaging and Diagnostic studies    MRI Brain with and without contrast: 1. No acute intracranial abnormality. 2. Multifocal hyperintense T2-weighted signal within the white matter, most consistent with chronic microvascular ischemia.   rEEG: This is an abnormal EEG due to right temporal slowing concerning for underlying neuronal dysfunction or an area of increased epileptogenicity.   Impression   Kelly [name] is a 76 y.o. female with history of HTN, DVT (on Eliquis), colon cancer s/p resection and chemotherapy and chemotherapy related peripheral neuropathy, who presents to the ED via EMS after  brief loss of consciousness at home in association with a visually unwitnessed fall, followed by confusion and memory loss which were gradually resolving at the time of arrival to the ED. Workup with MRI Brain with no acute abnormalities and rEEG with R temporal slowing.  I suspect that the episode was likely a seizure.  Recommendations  - Start Keppra 500mg  BID. Discussed side effects with patient. - Follow up with neurology clinic in 2-3 months. - Full discharge seizure precautions listed below and discussed with patient. - No driving for 6 months. Has to be seizure free before she can resume driving. - We will signoff. ______________________________________________________________________   Thank you for the opportunity to take part in the care of this patient. If you have any further questions, please contact the neurology consultation attending.  Signed,  Donnetta Simpers [facility] Pager Number [phone]    Seizure precautions: Per [facility] statutes, patients with seizures are not allowed to drive until they have been seizure-free for six months and cleared by a physician    Use caution when using heavy equipment or power tools. Avoid working on ladders or at heights. Take showers instead of baths. Ensure the water temperature is not too high on the home water heater. Do not go swimming alone. Do not lock yourself in a room alone (i.e. bathroom). When caring for infants or small children, sit down when holding, feeding, or changing them to minimize risk of injury to the child in the event you have a seizure. Maintain good sleep hygiene. Avoid alcohol.    If patient has another seizure, call 911 and bring them back to the ED if: A.  The seizure lasts longer than 5 minutes.      B.  The patient doesn't wake shortly after the seizure or has new problems such as difficulty seeing, speaking or moving following the seizure C.  The patient was injured during  the seizure D.  The patient has a temperature over 102 F (39C) E.  The patient vomited during the seizure and now is having trouble breathing    During the Seizure   - First, ensure adequate ventilation and place patients on the floor on their left side  Loosen clothing around the neck and ensure the airway is patent. If the patient is clenching the teeth, do not force the mouth open with any object as this can cause severe damage - Remove all items from the surrounding that can be hazardous. The patient may be oblivious to what's happening and may not even know what he or she is doing. If the patient is confused and wandering, either gently guide him/her away and block access to outside areas - Reassure the individual and be comforting - Call 911. In most cases, the seizure ends before EMS arrives. However, there are cases when seizures may last over 3  to 5 minutes. Or the individual may have developed breathing difficulties or severe injuries. If a pregnant patient or a person with diabetes develops a seizure, it is prudent to call an ambulance. - Finally, if the patient does not regain full consciousness, then call EMS. Most patients will remain confused for about 45 to 90 minutes after a seizure, so you must use judgment in calling for help. - Avoid restraints but make sure the patient is in a bed with padded side rails - Place the individual in a lateral position with the neck slightly flexed; this will help the saliva drain from the mouth and prevent the tongue from falling backward - Remove all nearby furniture and other hazards from the area - Provide verbal assurance as the individual is regaining consciousness - Provide the patient with privacy if possible - Call for help and start treatment as ordered by the caregiver    After the Seizure (Postictal Stage)   After a seizure, most patients experience confusion, fatigue, muscle pain and/or a headache. Thus, one should permit the  individual to sleep. For the next few days, reassurance is essential. Being calm and helping reorient the person is also of importance.   Most seizures are painless and end spontaneously. Seizures are not harmful to others but can lead to complications such as stress on the lungs, brain and the heart. Individuals with prior lung problems may develop labored breathing and respiratory distress.

## 2021-06-17 NOTE — Evaluation (Signed)
Occupational Therapy Evaluation and Discharge Patient Details Name: Kelly [name] MRN: [id number] DOB: 09-Aug-1945 Today's Date: 06/17/2021   History of Present Illness Kelly [name] is a 76 y.o. female presenting with unwitnessed fall with suspected postictal state. MRI (-). 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   Clinical Impression   This 76 yo female admitted with above presents to acute OT at an independent level for basic ADLs and mobility and Mod I for higher level balance activities (needs to hold onto something, due to peripheral neuropathy in feet). No further OT needs, we will sign off. No skilled PT needs noted and made them aware.      Recommendations for follow up therapy are one component of a multi-disciplinary discharge planning process, led by the attending physician.  Recommendations may be updated based on patient status, additional functional criteria and insurance authorization.   Follow Up Recommendations  No OT follow up    Assistance Recommended at Discharge None     Functional Status Assessment  Patient has not had a recent decline in their functional status  Equipment Recommendations  None recommended by OT       Precautions / Restrictions Precautions Precautions: Fall Restrictions Weight Bearing Restrictions: No      Mobility Bed Mobility Overal bed mobility: Independent                  Transfers Overall transfer level: Independent                        Balance Overall balance assessment: History of Falls (one, this occurance that brought her into the hospital.)                                         ADL either performed or assessed with  clinical judgement   ADL Overall ADL's : Independent                                             Vision Patient Visual Report: No change from baseline              Pertinent Vitals/Pain Pain Assessment: No/denies pain     Hand Dominance  right   Extremity/Trunk Assessment Upper Extremity Assessment Upper Extremity Assessment:  (peripheral neuropathy fingers; otherwise rest of hand/arms WNL)   Lower Extremity Assessment Lower Extremity Assessment:  (peripheral neuropathy feet, otherwise rest of legs WNL)       Communication  No issues   Cognition Arousal/Alertness: Awake/alert Behavior During Therapy: WFL for tasks assessed/performed Overall Cognitive Status: Within Functional Limits for tasks assessed                                       General Comments  Can bend over and pick up object from floor, walk fast-stop-and turn all the way arond, walk and look up/down/left/right without LOB. Cannot do tandem or stand on one leg without holding on due to peripherial  neuropathy in feet due to affects of chemotherapy.            Home Living Family/patient expects to be discharged to:: Private residence Living Arrangements: Children Available Help at Discharge: Family;Available PRN/intermittently Type of Home: House Home Access: Level entry     Home Layout: Multi-level Alternate Level Stairs-Number of Steps: several Alternate Level Stairs-Rails:  (rail on right or left depending on what steps she is going up/down) Bathroom Shower/Tub: Tub/shower unit;Curtain   Biochemist, clinical: Handicapped height     Home Equipment: None   Additional Comments: quite driving on her own due to peripheral neuorpathy in her feet      Prior Functioning/Environment Prior Level of Function : Independent/Modified Independent                        OT Problem List: Impaired balance (sitting and/or standing) (high level due to peripherial  neuropathy)         OT Goals(Current goals can be found in the care plan section) Acute Rehab OT Goals Patient Stated Goal: to talk to neurology and go home         AM-PAC OT "6 Clicks" Daily Activity     Outcome Measure Help from another person eating meals?: None Help from another person taking care of personal grooming?: None Help from another person toileting, which includes using toliet, bedpan, or urinal?: None Help from another person bathing (including washing, rinsing, drying)?: None Help from another person to put on and taking off regular upper body clothing?: None Help from another person to put on and taking off regular lower body clothing?: None 6 Click Score: 24   End of Session Equipment Utilized During Treatment: Gait belt Nurse Communication: Mobility status  Activity Tolerance: Patient tolerated treatment well Patient left: in chair  OT Visit Diagnosis: History of falling (Z91.81)                Time: 6010-9323 OT Time Calculation (min): 19 min Charges:  OT General Charges $OT Visit: 1 Visit OT Evaluation $OT Eval Moderate Complexity: 1 Mod  [address], OTR/L Acute [employer] Pager [phone] Office [phone]    Almon Register 06/17/2021, 8:23 AM

## 2021-06-17 NOTE — Discharge Instructions (Addendum)
Dear Kelly [name],   Thank you so much for allowing [place] to be part of your care!  You were admitted to [facility] for a fall. Your EEG showed some epileptogenic waves and Neurology started you on Keppra 500 mg twice a day.    POST-HOSPITAL & CARE INSTRUCTIONS Follow up with neurology clinic in 2-3 months No driving for 6 months Please let PCP/Specialists know of any changes that were made.  Please see medications section of this packet for any medication changes.   DOCTOR'S APPOINTMENT & FOLLOW UP CARE INSTRUCTIONS  No future appointments.  RETURN PRECAUTIONS:   Take care and be well!  [facility]  [place], [place] [address] [phone]

## 2021-06-17 NOTE — Hospital Course (Signed)
Kelly [name] is a 76 year old female who presented with a and unwitnessed fall with suspected postictal state.  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  Unwitnessed fall Patient had unwitnessed fall with postictal confusions afterwards which resolved spontaneously in the ED.  Cardiac etiology unlikely given echo showed EF of 65 to 70% with grade 1 diastolic dysfunction.  Neurology was consulted and performed EEG. MRI of the brain showed no acute abnormalities.  EEG showed right temporal slowing concerning for underlying neuronal dysfunction or an area of increased epileptogenicity.  Neurology started patient on Keppra 500 mg twice daily and will follow-up with her in 2 to 3 months.  There were no PT or OT recommendations for patient.  Chronic conditions remained stable

## 2021-06-17 NOTE — ED Notes (Signed)
Pt removed tele leads and threw them. Pt not on cardiac monitoring at this time. Will continue to monitor.

## 2021-06-17 NOTE — ED Notes (Signed)
Pt verbalized understanding of d/c instructions, meds and followup care. Pt verbalizes understanding no driving for 6 months. Denies questions. VSS, no distress noted. Steady gait to exit with all belongings to daughter's vehicle.

## 2021-06-24 ENCOUNTER — Other Ambulatory Visit: Payer: Self-pay | Admitting: Family Medicine

## 2021-07-24 ENCOUNTER — Other Ambulatory Visit: Payer: Self-pay | Admitting: Family Medicine

## 2021-09-04 ENCOUNTER — Other Ambulatory Visit: Payer: Self-pay | Admitting: Urology

## 2021-09-04 DIAGNOSIS — D49512 Neoplasm of unspecified behavior of left kidney: Secondary | ICD-10-CM

## 2021-09-16 ENCOUNTER — Ambulatory Visit
Admission: RE | Admit: 2021-09-16 | Discharge: 2021-09-16 | Disposition: A | Discharge status: Home / Self Care | Payer: Medicare PPO | Source: Ambulatory Visit | Attending: Urology | Admitting: Urology

## 2021-09-16 DIAGNOSIS — D49512 Neoplasm of unspecified behavior of left kidney: Secondary | ICD-10-CM

## 2021-09-16 IMAGING — MR MR ABDOMEN WO/W CM
11 of 17 series · 29 of 48 positions shown · IV contrast (multihance)
Comparison: Multiple priors including MRI [DATE]

CLINICAL DATA: Follow-up enhancing left renal lesion.

EXAM:
MRI ABDOMEN WITHOUT AND WITH CONTRAST
TECHNIQUE: Multiplanar multisequence MR imaging of the abdomen was performed
both before and after the administration of intravenous contrast.
CONTRAST:  15mL MULTIHANCE GADOBENATE DIMEGLUMINE 529 MG/ML IV SOLN

[Series 3: cor haste · coronal · 5.0mm · 0.68mm/px · 2 of 34 slices shown]
[im 1/34]
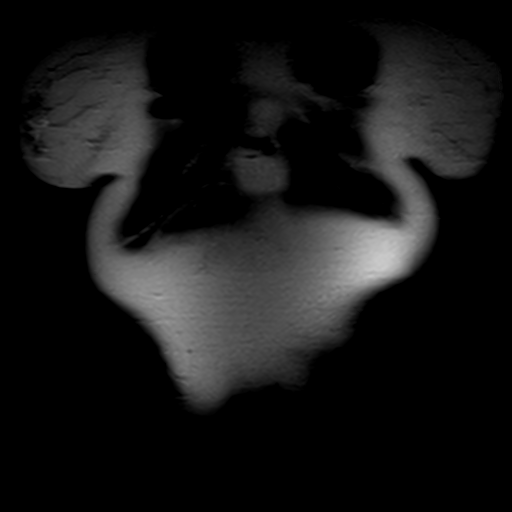
[im 34/34]
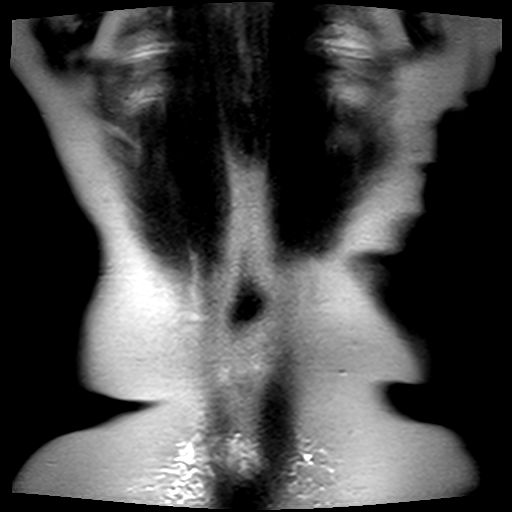

[Series 4: T2 · axial · 5.0mm · 0.68mm/px · z∈[-144,+59]mm · 2 of 35 slices shown (1 of 2)]
[im 1/35]
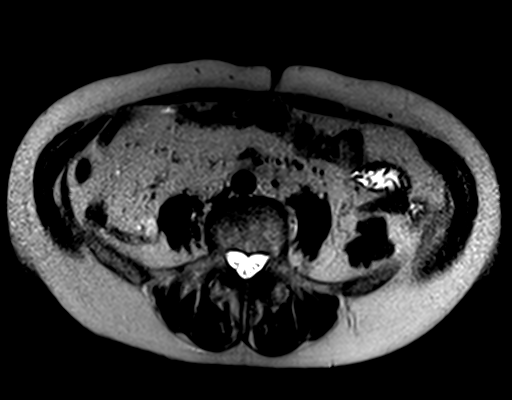
[im 35/35]
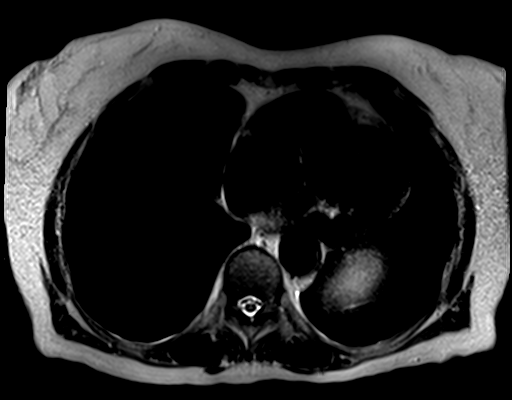

[Series 5: ep2d_diff_b50_500_800_p2_trig · axial · 5.0mm · 1.82mm/px · z∈[-113,+91]mm · 5 of 105 slices shown]
[im 1/105]
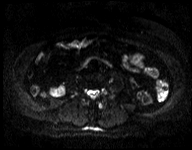
[im 27/105]
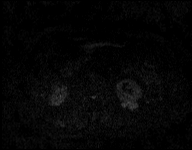
[im 53/105]
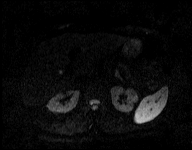
[im 79/105]
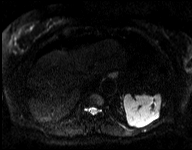
[im 105/105]
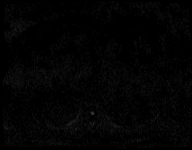

[Series 6: ep2d_diff_b50_500_800_p2_trig_adc · axial · 5.0mm · 1.82mm/px · z∈[-113,+91]mm · 2 of 35 slices shown]
[im 1/35]
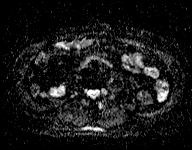
[im 35/35]
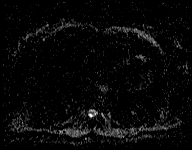

[Series 7: T2 · axial · 5.0mm · 1.37mm/px · z∈[-113,+91]mm · 2 of 35 slices shown (2 of 2)]
[im 1/35]
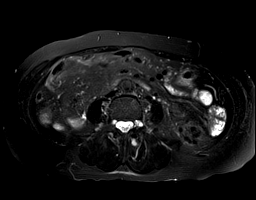
[im 35/35]
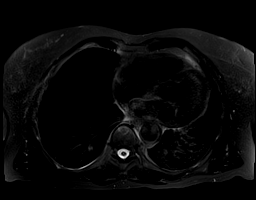

[Series 8: bSSFP · axial · 4.0mm · 0.68mm/px · z∈[-128,+64]mm · 2 of 49 slices shown]
[im 1/49]
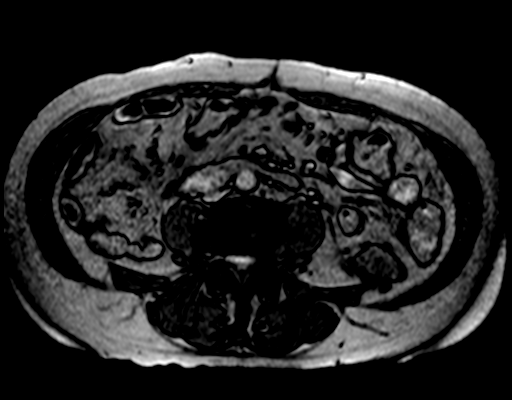
[im 49/49]
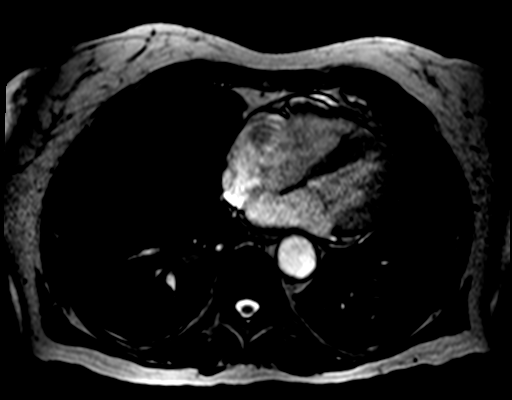

[Series 9: T1 · axial · 5.0mm · 0.68mm/px · z∈[-128,+64]mm · 3 of 66 slices shown]
[im 1/66]
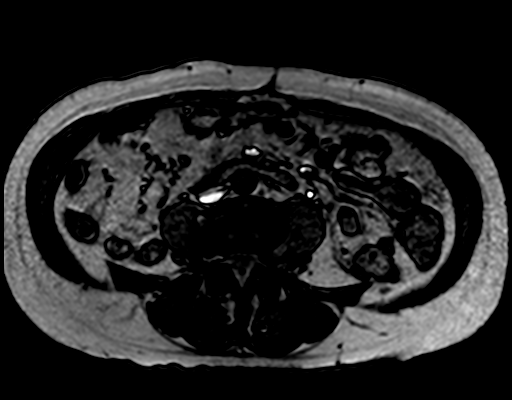
[im 33/66]
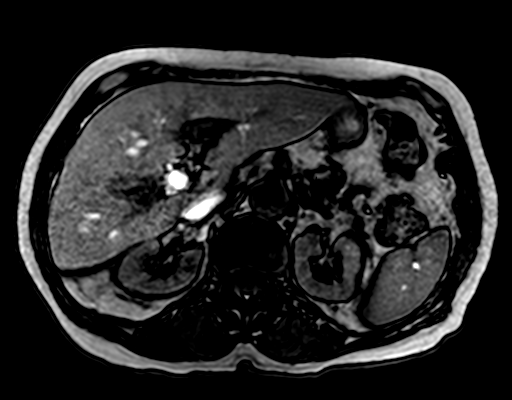
[im 66/66]
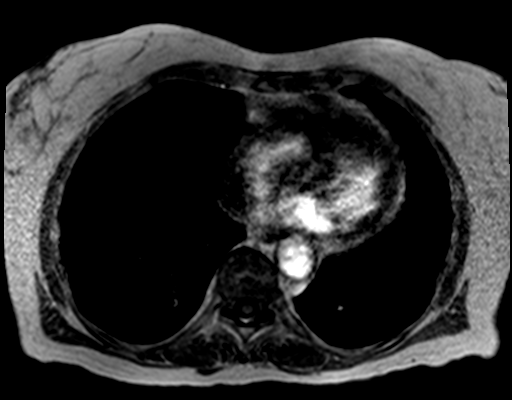

[Series 10: T1 dynamic · axial · non-contrast · 2.5mm · 0.74mm/px · z∈[-142,+76]mm · 3 of 88 slices shown]
[im 1/88]
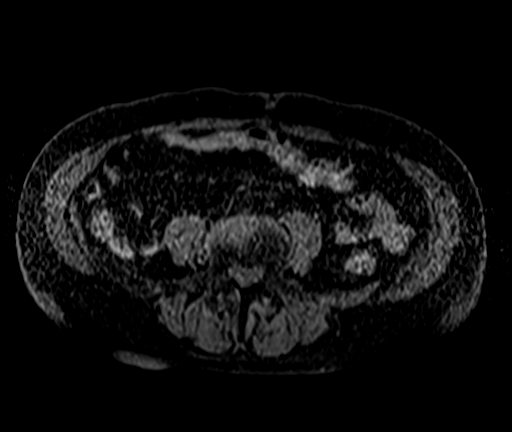
[im 44/88]
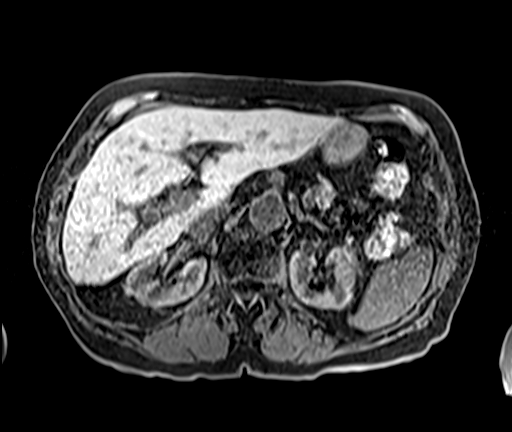
[im 88/88]
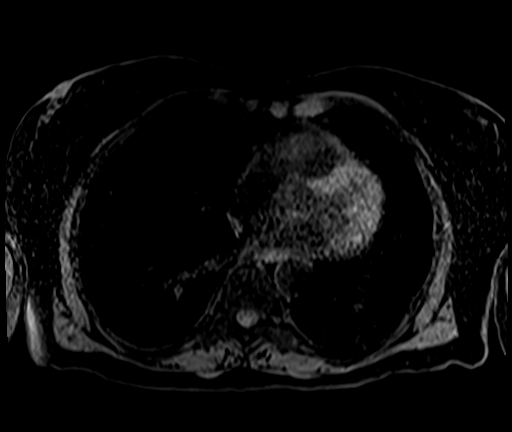

[Series 11: T1 dynamic post-contrast · axial · 2.5mm · 0.74mm/px · z∈[-142,+76]mm · 3 of 88 slices shown (1 of 3)]
[im 1/88]
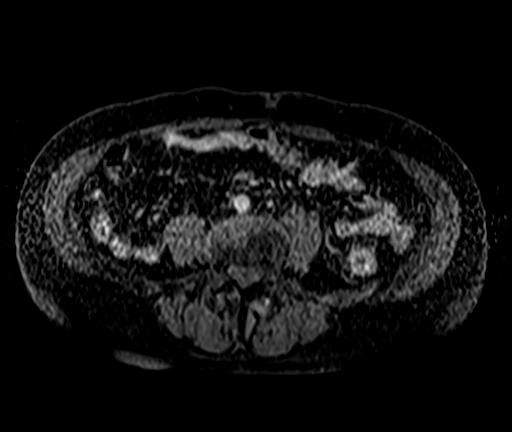
[im 44/88]
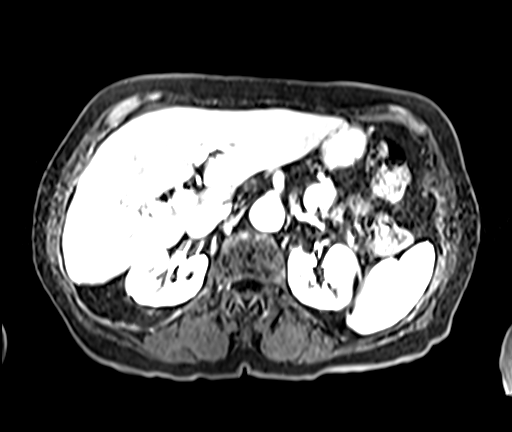
[im 88/88]
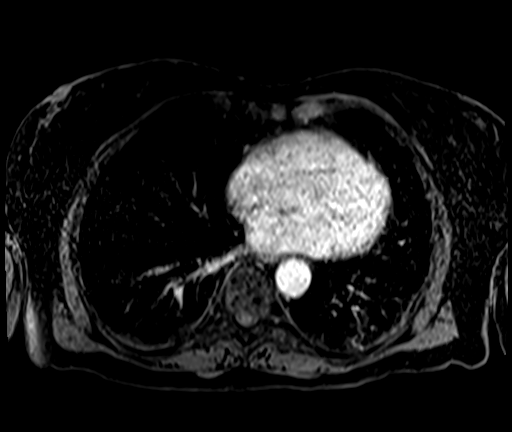

[Series 12: T1 dynamic post-contrast · axial · 2.5mm · 0.74mm/px · z∈[-142,+76]mm · 3 of 88 slices shown (2 of 3)]
[im 1/88]
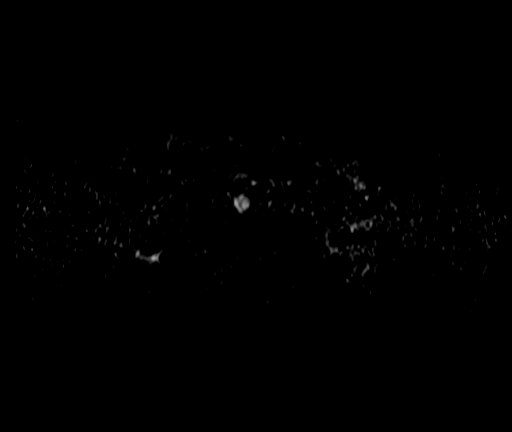
[im 44/88]
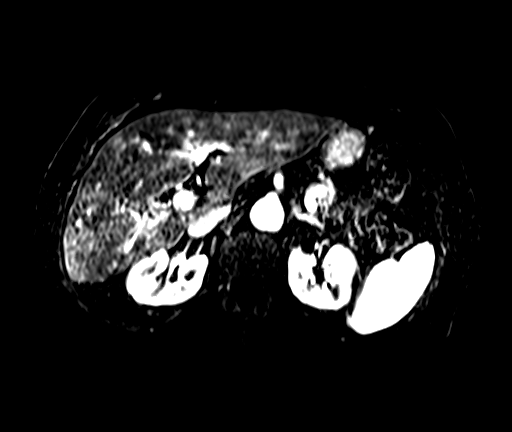
[im 88/88]
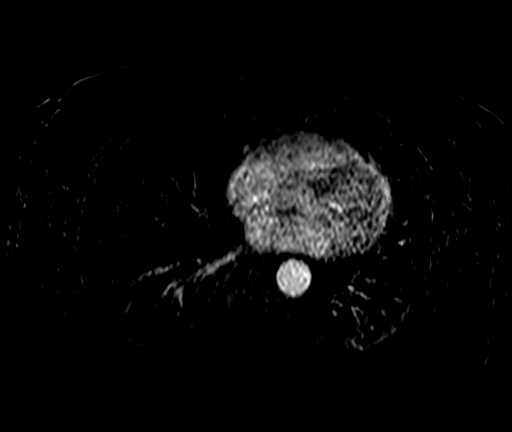

[Series 13: T1 dynamic post-contrast · axial · 2.5mm · 0.74mm/px · z∈[-142,-34]mm · 2 of 88 slices shown (3 of 3)]
[im 1/88]
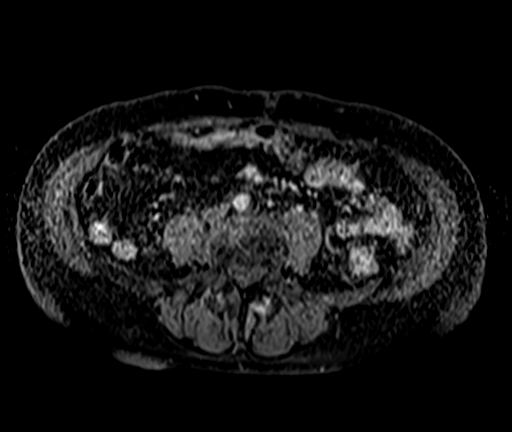
[im 44/88]
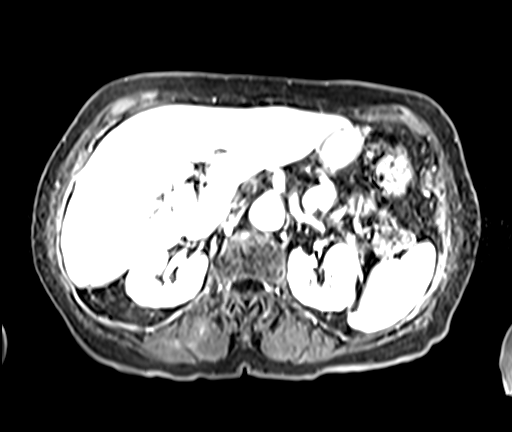

[29 of 48 positions shown; findings below may reference images not displayed]

FINDINGS: Lower chest: Heterogeneous signal in the bilateral lung bases is
favored to reflect hypoventilatory change.

Hepatobiliary: No significant hepatic steatosis. 4 mm hyperenhancing
focus in the right lobe of the liver on image 47/11 which
equilibrates to background liver on more delayed phases of imaging,
similar to prior and favored to reflect a benign perfusional
anomaly. No suspicious enhancing hepatic lesions identified.
Gallbladder surgically absent. Dilated common bile duct measures 13
mm similar to prior when it measured 14 mm likely reflecting
reservoir effect post cholecystectomy.

Pancreas: No pancreatic ductal dilation or evidence of acute
inflammation.

Spleen:  Within normal limits in size and appearance.

Adrenals/Urinary Tract:  Bilateral adrenal glands appear normal.

Partially exophytic solid enhancing left upper pole renal lesion
measures 1.5 cm on image 40/11, unchanged from prior.

Well-circumscribed T2 hyperintense 2 cm left lower pole renal lesion
demonstrates some layering hyperdense T1 signal and thin internal
septations without suspicious postcontrast enhancement, consistent
with a benign Bosniak classification 2 renal cysts.

Benign Bosniak classification 1 left lower pole renal cysts measures
3 cm and 1.1 cm.

There are additional small bilateral fluid signal renal lesions
which are technically too small to accurately characterize.

Stomach/Bowel: Colonic diverticulosis. No evidence of acute bowel
inflammation or obstruction.

Vascular/Lymphatic: No evidence of tumor in left renal vein. The
portal, splenic and superior mesenteric veins are patent. No
pathologically enlarged abdominal or pelvic lymph nodes. Normal
caliber abdominal aorta.

Other:  No significant abdominopelvic free fluid.

Musculoskeletal: No suspicious osseous lesion identified.
IMPRESSION: 1. Unchanged size of the left upper pole solid enhancing renal mass,
suspicious for renal cell carcinoma. No evidence of renal tumor in
vein or abdominal metastatic disease.
2. Stable Bosniak classification 1 and 2 benign renal cysts.

## 2021-09-16 MED ORDER — HEPARIN SOD (PORK) LOCK FLUSH 100 UNIT/ML IV SOLN
500.0000 [IU] | Freq: Once | INTRAVENOUS | Status: AC
  Administered 2021-09-16: 500 [IU] via INTRAVENOUS

## 2021-09-16 MED ORDER — SODIUM CHLORIDE 0.9% FLUSH
10.0000 mL | INTRAVENOUS | Status: DC | PRN
Start: 2021-09-16 — End: 2021-09-17
  Administered 2021-09-16: 10 mL via INTRAVENOUS

## 2021-09-16 MED ORDER — GADOBENATE DIMEGLUMINE 529 MG/ML IV SOLN
15.0000 mL | Freq: Once | INTRAVENOUS | Status: AC | PRN
Start: 2021-09-16 — End: 2021-09-16
  Administered 2021-09-16: 15 mL via INTRAVENOUS

## 2022-07-22 ENCOUNTER — Other Ambulatory Visit: Payer: Self-pay | Admitting: Urology

## 2022-07-22 DIAGNOSIS — D49512 Neoplasm of unspecified behavior of left kidney: Secondary | ICD-10-CM

## 2022-09-02 ENCOUNTER — Ambulatory Visit
Admission: RE | Admit: 2022-09-02 | Discharge: 2022-09-02 | Disposition: A | Discharge status: Home / Self Care | Payer: Medicare PPO | Source: Ambulatory Visit | Attending: Urology | Admitting: Urology

## 2022-09-02 DIAGNOSIS — D49512 Neoplasm of unspecified behavior of left kidney: Secondary | ICD-10-CM

## 2022-09-02 MED ORDER — GADOPICLENOL 0.5 MMOL/ML IV SOLN
7.0000 mL | Freq: Once | INTRAVENOUS | Status: AC | PRN
  Administered 2022-09-02: 7 mL via INTRAVENOUS

## 2023-02-28 ENCOUNTER — Emergency Department (HOSPITAL_COMMUNITY): Payer: Medicare PPO

## 2023-02-28 ENCOUNTER — Other Ambulatory Visit: Payer: Self-pay

## 2023-02-28 ENCOUNTER — Inpatient Hospital Stay (HOSPITAL_COMMUNITY)
Admission: EM | Admit: 2023-02-28 | Discharge: 2023-03-11 | DRG: 551 | Disposition: A | Discharge status: Home Health | Payer: Medicare PPO | Attending: General Surgery | Admitting: General Surgery

## 2023-02-28 ENCOUNTER — Encounter (HOSPITAL_COMMUNITY): Payer: Self-pay

## 2023-02-28 DIAGNOSIS — N2889 Other specified disorders of kidney and ureter: Secondary | ICD-10-CM | POA: Diagnosis present

## 2023-02-28 DIAGNOSIS — J449 Chronic obstructive pulmonary disease, unspecified: Secondary | ICD-10-CM | POA: Diagnosis present

## 2023-02-28 DIAGNOSIS — G629 Polyneuropathy, unspecified: Secondary | ICD-10-CM | POA: Diagnosis present

## 2023-02-28 DIAGNOSIS — Z882 Allergy status to sulfonamides status: Secondary | ICD-10-CM | POA: Diagnosis not present

## 2023-02-28 DIAGNOSIS — K649 Unspecified hemorrhoids: Secondary | ICD-10-CM | POA: Diagnosis present

## 2023-02-28 DIAGNOSIS — Y92410 Unspecified street and highway as the place of occurrence of the external cause: Secondary | ICD-10-CM | POA: Diagnosis not present

## 2023-02-28 DIAGNOSIS — R509 Fever, unspecified: Secondary | ICD-10-CM | POA: Diagnosis not present

## 2023-02-28 DIAGNOSIS — T148XXA Other injury of unspecified body region, initial encounter: Secondary | ICD-10-CM

## 2023-02-28 DIAGNOSIS — R569 Unspecified convulsions: Secondary | ICD-10-CM | POA: Diagnosis not present

## 2023-02-28 DIAGNOSIS — I82220 Acute embolism and thrombosis of inferior vena cava: Secondary | ICD-10-CM | POA: Diagnosis present

## 2023-02-28 DIAGNOSIS — G40909 Epilepsy, unspecified, not intractable, without status epilepticus: Secondary | ICD-10-CM | POA: Diagnosis present

## 2023-02-28 DIAGNOSIS — S36892A Contusion of other intra-abdominal organs, initial encounter: Secondary | ICD-10-CM | POA: Diagnosis present

## 2023-02-28 DIAGNOSIS — S36115A Moderate laceration of liver, initial encounter: Secondary | ICD-10-CM | POA: Diagnosis present

## 2023-02-28 DIAGNOSIS — Z79899 Other long term (current) drug therapy: Secondary | ICD-10-CM

## 2023-02-28 DIAGNOSIS — Q2112 Patent foramen ovale: Secondary | ICD-10-CM | POA: Diagnosis not present

## 2023-02-28 DIAGNOSIS — S22078A Other fracture of T9-T10 vertebra, initial encounter for closed fracture: Secondary | ICD-10-CM | POA: Diagnosis present

## 2023-02-28 DIAGNOSIS — Z7901 Long term (current) use of anticoagulants: Secondary | ICD-10-CM

## 2023-02-28 DIAGNOSIS — R55 Syncope and collapse: Secondary | ICD-10-CM | POA: Diagnosis present

## 2023-02-28 DIAGNOSIS — I4891 Unspecified atrial fibrillation: Secondary | ICD-10-CM | POA: Diagnosis not present

## 2023-02-28 DIAGNOSIS — M4854XA Collapsed vertebra, not elsewhere classified, thoracic region, initial encounter for fracture: Principal | ICD-10-CM | POA: Diagnosis present

## 2023-02-28 DIAGNOSIS — T1490XA Injury, unspecified, initial encounter: Secondary | ICD-10-CM | POA: Diagnosis present

## 2023-02-28 DIAGNOSIS — Z9221 Personal history of antineoplastic chemotherapy: Secondary | ICD-10-CM

## 2023-02-28 DIAGNOSIS — S1083XA Contusion of other specified part of neck, initial encounter: Secondary | ICD-10-CM | POA: Diagnosis present

## 2023-02-28 DIAGNOSIS — S60511A Abrasion of right hand, initial encounter: Secondary | ICD-10-CM | POA: Diagnosis present

## 2023-02-28 DIAGNOSIS — I480 Paroxysmal atrial fibrillation: Secondary | ICD-10-CM | POA: Diagnosis present

## 2023-02-28 DIAGNOSIS — I6521 Occlusion and stenosis of right carotid artery: Secondary | ICD-10-CM | POA: Diagnosis present

## 2023-02-28 DIAGNOSIS — R0902 Hypoxemia: Secondary | ICD-10-CM | POA: Diagnosis not present

## 2023-02-28 DIAGNOSIS — Z9049 Acquired absence of other specified parts of digestive tract: Secondary | ICD-10-CM

## 2023-02-28 DIAGNOSIS — Z87891 Personal history of nicotine dependence: Secondary | ICD-10-CM

## 2023-02-28 DIAGNOSIS — I272 Pulmonary hypertension, unspecified: Secondary | ICD-10-CM | POA: Diagnosis not present

## 2023-02-28 DIAGNOSIS — I959 Hypotension, unspecified: Secondary | ICD-10-CM | POA: Diagnosis not present

## 2023-02-28 DIAGNOSIS — S36113A Laceration of liver, unspecified degree, initial encounter: Secondary | ICD-10-CM

## 2023-02-28 DIAGNOSIS — I2723 Pulmonary hypertension due to lung diseases and hypoxia: Secondary | ICD-10-CM | POA: Diagnosis present

## 2023-02-28 DIAGNOSIS — Z85038 Personal history of other malignant neoplasm of large intestine: Secondary | ICD-10-CM

## 2023-02-28 DIAGNOSIS — Z90710 Acquired absence of both cervix and uterus: Secondary | ICD-10-CM

## 2023-02-28 LAB — COMPREHENSIVE METABOLIC PANEL
ALT: 20 U/L (ref 0–44)
AST: 36 U/L (ref 15–41)
Albumin: 3.6 g/dL (ref 3.5–5.0)
Alkaline Phosphatase: 65 U/L (ref 38–126)
Anion gap: 12 (ref 5–15)
BUN: 21 mg/dL (ref 8–23)
CO2: 22 mmol/L (ref 22–32)
Calcium: 8.8 mg/dL — ABNORMAL LOW (ref 8.9–10.3)
Chloride: 106 mmol/L (ref 98–111)
Creatinine, Ser: 1.07 mg/dL — ABNORMAL HIGH (ref 0.44–1.00)
GFR, Estimated: 54 mL/min — ABNORMAL LOW (ref >60)
Glucose, Bld: 121 mg/dL — ABNORMAL HIGH (ref 70–99)
Potassium: 4.2 mmol/L (ref 3.5–5.1)
Sodium: 140 mmol/L (ref 135–145)
Total Bilirubin: 0.7 mg/dL (ref 0.3–1.2)
Total Protein: 6.6 g/dL (ref 6.5–8.1)

## 2023-02-28 LAB — I-STAT CHEM 8, ED
BUN: 28 mg/dL — ABNORMAL HIGH (ref 8–23)
Calcium, Ion: 1.12 mmol/L — ABNORMAL LOW (ref 1.15–1.40)
Chloride: 108 mmol/L (ref 98–111)
Creatinine, Ser: 1 mg/dL (ref 0.44–1.00)
Glucose, Bld: 110 mg/dL — ABNORMAL HIGH (ref 70–99)
HCT: 44 % (ref 36.0–46.0)
Hemoglobin: 15 g/dL (ref 12.0–15.0)
Potassium: 4.3 mmol/L (ref 3.5–5.1)
Sodium: 143 mmol/L (ref 135–145)
TCO2: 25 mmol/L (ref 22–32)

## 2023-02-28 LAB — CBC
HCT: 44.2 % (ref 36.0–46.0)
Hemoglobin: 14 g/dL (ref 12.0–15.0)
MCH: 28.8 pg (ref 26.0–34.0)
MCHC: 31.7 g/dL (ref 30.0–36.0)
MCV: 90.9 fL (ref 80.0–100.0)
Platelets: 247 10*3/uL (ref 150–400)
RBC: 4.86 MIL/uL (ref 3.87–5.11)
RDW: 12.5 % (ref 11.5–15.5)
WBC: 14.1 10*3/uL — ABNORMAL HIGH (ref 4.0–10.5)
nRBC: 0 % (ref 0.0–0.2)

## 2023-02-28 LAB — PROTIME-INR
INR: 1.3 — ABNORMAL HIGH (ref 0.8–1.2)
Prothrombin Time: 15.9 s — ABNORMAL HIGH (ref 11.4–15.2)

## 2023-02-28 LAB — URINALYSIS, ROUTINE W REFLEX MICROSCOPIC
Bacteria, UA: NONE SEEN
Bilirubin Urine: NEGATIVE
Glucose, UA: NEGATIVE mg/dL
Ketones, ur: NEGATIVE mg/dL
Leukocytes,Ua: NEGATIVE
Nitrite: NEGATIVE
Protein, ur: NEGATIVE mg/dL
Specific Gravity, Urine: 1.031 — ABNORMAL HIGH (ref 1.005–1.030)
pH: 5 (ref 5.0–8.0)

## 2023-02-28 LAB — TYPE AND SCREEN
ABO/RH(D): A POS
Antibody Screen: NEGATIVE

## 2023-02-28 LAB — ETHANOL: Alcohol, Ethyl (B): <10 mg/dL (ref <10)

## 2023-02-28 LAB — MRSA NEXT GEN BY PCR, NASAL: MRSA by PCR Next Gen: NOT DETECTED

## 2023-02-28 LAB — TROPONIN I (HIGH SENSITIVITY)
Troponin I (High Sensitivity): 5 ng/L (ref <18)
Troponin I (High Sensitivity): 4 ng/L (ref <18)

## 2023-02-28 LAB — I-STAT CG4 LACTIC ACID, ED: Lactic Acid, Venous: 2.3 mmol/L (ref 0.5–1.9)

## 2023-02-28 LAB — HEMOGLOBIN AND HEMATOCRIT, BLOOD
HCT: 38.1 % (ref 36.0–46.0)
Hemoglobin: 12.2 g/dL (ref 12.0–15.0)

## 2023-02-28 MED ORDER — ONDANSETRON HCL 4 MG/2ML IJ SOLN
4.0000 mg | Freq: Four times a day (QID) | INTRAMUSCULAR | Status: DC | PRN
  Administered 2023-02-28 – 2023-03-10 (×4): 4 mg via INTRAVENOUS
  Filled 2023-02-28 (×4): qty 2

## 2023-02-28 MED ORDER — OXYCODONE HCL 5 MG PO TABS
5.0000 mg | ORAL_TABLET | ORAL | Status: DC | PRN
  Administered 2023-02-28 – 2023-03-06 (×21): 5 mg via ORAL
  Filled 2023-02-28 (×22): qty 1

## 2023-02-28 MED ORDER — ONDANSETRON 4 MG PO TBDP: 4.0000 mg | ORAL_TABLET | Freq: Four times a day (QID) | ORAL | Status: DC | PRN

## 2023-02-28 MED ORDER — DOCUSATE SODIUM 100 MG PO CAPS
100.0000 mg | ORAL_CAPSULE | Freq: Two times a day (BID) | ORAL | Status: DC
  Administered 2023-03-01 – 2023-03-08 (×16): 100 mg via ORAL
  Filled 2023-02-28 (×19): qty 1

## 2023-02-28 MED ORDER — HYDROMORPHONE HCL 1 MG/ML IJ SOLN
1.0000 mg | INTRAMUSCULAR | Status: DC | PRN
  Administered 2023-02-28 – 2023-03-10 (×15): 1 mg via INTRAVENOUS
  Filled 2023-02-28 (×15): qty 1

## 2023-02-28 MED ORDER — METOPROLOL TARTRATE 5 MG/5ML IV SOLN: 5.0000 mg | Freq: Four times a day (QID) | INTRAVENOUS | Status: DC | PRN

## 2023-02-28 MED ORDER — CHLORHEXIDINE GLUCONATE CLOTH 2 % EX PADS
6.0000 | MEDICATED_PAD | Freq: Every day | CUTANEOUS | Status: DC
  Administered 2023-02-28 – 2023-03-04 (×5): 6 via TOPICAL

## 2023-02-28 MED ORDER — METHOCARBAMOL 1000 MG/10ML IJ SOLN
500.0000 mg | Freq: Three times a day (TID) | INTRAVENOUS | Status: AC
  Filled 2023-02-28: qty 5

## 2023-02-28 MED ORDER — LACTATED RINGERS IV SOLN
INTRAVENOUS | Status: DC
  Administered 2023-03-01: 125 mL/h via INTRAVENOUS

## 2023-02-28 MED ORDER — METHOCARBAMOL 500 MG PO TABS
500.0000 mg | ORAL_TABLET | Freq: Three times a day (TID) | ORAL | Status: AC
  Administered 2023-02-28 – 2023-03-03 (×8): 500 mg via ORAL
  Filled 2023-02-28 (×9): qty 1

## 2023-02-28 MED ORDER — IOHEXOL 350 MG/ML SOLN
75.0000 mL | Freq: Once | INTRAVENOUS | Status: AC | PRN
  Administered 2023-02-28 (×2): 75 mL via INTRAVENOUS

## 2023-02-28 MED ORDER — ACETAMINOPHEN 500 MG PO TABS
1000.0000 mg | ORAL_TABLET | Freq: Four times a day (QID) | ORAL | Status: DC
  Administered 2023-02-28 – 2023-03-11 (×34): 1000 mg via ORAL
  Filled 2023-02-28 (×42): qty 2

## 2023-02-28 MED ORDER — PROTHROMBIN COMPLEX CONC HUMAN 500 UNITS IV KIT
2088.0000 [IU] | PACK | Status: AC
  Administered 2023-02-28: 2088 [IU] via INTRAVENOUS
  Filled 2023-02-28: qty 2088

## 2023-02-28 MED ORDER — HYDRALAZINE HCL 20 MG/ML IJ SOLN: 10.0000 mg | INTRAMUSCULAR | Status: DC | PRN

## 2023-02-28 MED ORDER — POLYETHYLENE GLYCOL 3350 17 G PO PACK
17.0000 g | PACK | Freq: Every day | ORAL | Status: DC | PRN
  Administered 2023-03-06 – 2023-03-07 (×2): 17 g via ORAL
  Filled 2023-02-28 (×2): qty 1
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
## 2023-02-28 NOTE — ED Notes (Addendum)
Dr. Hillery Hunter at bedside
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
## 2023-02-28 NOTE — ED Notes (Signed)
RN covered skin tears

## 2023-02-28 NOTE — ED Notes (Signed)
Trauma Response Nurse Documentation   Kelly [name] is a 77 y.o. female arriving to [facility] ED via [facility]  On No antithrombotic. Trauma was activated as a Level 2 by Charge RN based on the following trauma criteria Elderly patients > 65 with head trauma on anti-coagulation (excluding ASA).  Patient cleared for CT by Dr. Rosalia Hammers. Pt transported to CT with trauma response nurse present to monitor. RN remained with the patient throughout their absence from the department for clinical observation.   GCS 15.  History   Past Medical History:  Diagnosis Date   Acute gallstone pancreatitis s/p lap cholecystectomy 10/26/2015 10/24/2015   Choledocholithiasis 10/24/2015   Colon cancer [facility])    with surgery in Jan. 2022   COPD (chronic obstructive pulmonary disease) (HCC)      Past Surgical History:  Procedure Laterality Date   ABDOMINAL HYSTERECTOMY  06/2010   bladder tack     CHOLECYSTECTOMY N/A 10/26/2015   Procedure: LAPAROSCOPIC CHOLECYSTECTOMY WITH INTRAOPERATIVE CHOLANGIOGRAM;  Surgeon: Karie Soda, MD;  Location: [facility] ORS;  Service: General;  Laterality: N/A;   DILATION AND CURETTAGE OF UTERUS  06/2010   ERCP N/A 10/25/2015   Procedure: ENDOSCOPIC RETROGRADE CHOLANGIOPANCREATOGRAPHY (ERCP);  Surgeon: Rachael Fee, MD;  Location: [facility];  Service: Endoscopy;  Laterality: N/A;   HERNIA REPAIR     inguinal hernia repair - at 77 years old   TONSILLECTOMY         Initial Focused Assessment (If applicable, or please see trauma documentation): Airway- clear Breathing- spontaneous, unlabored Circulation- no active external hemorrhage noted-  GCS 15   CT's Completed:   CT Head, CT C-Spine, CT Chest w/ contrast, and CT abdomen/pelvis w/ contrast   Interventions:  Labs Xrays CT scans Pain control Admit to ICU  Plan for disposition:  Admission to ICU   Consults completed:  Neurosurgeon at 1600 -- Nundkumar returned call.  Event Summary:  See primary RN and  EDP notes     Hewitt Shorts  Trauma Response RN  Please call TRN at [phone] for further assistance.
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
## 2023-03-01 ENCOUNTER — Inpatient Hospital Stay (HOSPITAL_COMMUNITY): Payer: Medicare PPO

## 2023-03-01 LAB — CBC
HCT: 34.6 % — ABNORMAL LOW (ref 36.0–46.0)
Hemoglobin: 10.9 g/dL — ABNORMAL LOW (ref 12.0–15.0)
MCH: 28.7 pg (ref 26.0–34.0)
MCHC: 31.5 g/dL (ref 30.0–36.0)
MCV: 91.1 fL (ref 80.0–100.0)
Platelets: 167 10*3/uL (ref 150–400)
RBC: 3.8 MIL/uL — ABNORMAL LOW (ref 3.87–5.11)
RDW: 12.7 % (ref 11.5–15.5)
WBC: 11.5 10*3/uL — ABNORMAL HIGH (ref 4.0–10.5)
nRBC: 0 % (ref 0.0–0.2)

## 2023-03-01 LAB — BASIC METABOLIC PANEL
Anion gap: 7 (ref 5–15)
BUN: 20 mg/dL (ref 8–23)
CO2: 26 mmol/L (ref 22–32)
Calcium: 8 mg/dL — ABNORMAL LOW (ref 8.9–10.3)
Chloride: 105 mmol/L (ref 98–111)
Creatinine, Ser: 0.94 mg/dL (ref 0.44–1.00)
GFR, Estimated: >60 mL/min (ref >60)
Glucose, Bld: 116 mg/dL — ABNORMAL HIGH (ref 70–99)
Potassium: 4 mmol/L (ref 3.5–5.1)
Sodium: 138 mmol/L (ref 135–145)

## 2023-03-01 LAB — HEMOGLOBIN AND HEMATOCRIT, BLOOD
HCT: 34.3 % — ABNORMAL LOW (ref 36.0–46.0)
HCT: 32.5 % — ABNORMAL LOW (ref 36.0–46.0)
Hemoglobin: 10.9 g/dL — ABNORMAL LOW (ref 12.0–15.0)
Hemoglobin: 10.3 g/dL — ABNORMAL LOW (ref 12.0–15.0)

## 2023-03-01 MED ORDER — LEVETIRACETAM 500 MG PO TABS
500.0000 mg | ORAL_TABLET | Freq: Two times a day (BID) | ORAL | Status: DC
  Administered 2023-03-01 – 2023-03-04 (×7): 500 mg via ORAL
  Filled 2023-03-01 (×7): qty 1

## 2023-03-01 NOTE — Progress Notes (Addendum)
02/28/23 Trauma service notified of patient going into A. Fib/A flutter with max HR of 120bpm but BP still adequate with MAP above 65. Patient asymptomatic. 12 lead EKG obtained and in chart. No known history of Afib. On eliquis PTA for blood clot. Afib occurred about 2050 and aborted spontaneously about 2126.
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
## 2023-03-01 NOTE — Progress Notes (Signed)
Orthopedic Tech Progress Note Patient Details:  Kelly [name] [birth year] [id number]  Ortho Devices Type of Ortho Device: Thoracolumbar corset (TLSO) Ortho Device/Splint Location: at bedside. adjusted Ortho Device/Splint Interventions: Ordered, Adjustment   Post Interventions Instructions Provided: Care of device, Adjustment of device  [name] Carmine Savoy 03/01/2023, 1:28 PM

## 2023-03-01 NOTE — Progress Notes (Signed)
Dr. Freida Busman paged and notified of BP 88/51 (63) while patient is sleeping. When awake she is alert and oriented and asymptomatic. She has LR running at 147ml/hr. No new order at this time. Will continue to monitor.
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
## 2023-03-01 NOTE — Progress Notes (Signed)
The patient noted to have desaturations while asleep. SPO2 as low as 81. The patient has oxygen via nasal cannula. 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 She says her last hospital admission her spo2 stayed around 82-88%
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
## 2023-03-02 ENCOUNTER — Inpatient Hospital Stay (HOSPITAL_COMMUNITY): Payer: Medicare PPO

## 2023-03-02 DIAGNOSIS — I4891 Unspecified atrial fibrillation: Secondary | ICD-10-CM | POA: Diagnosis not present

## 2023-03-02 LAB — CBC
HCT: 35 % — ABNORMAL LOW (ref 36.0–46.0)
Hemoglobin: 10.9 g/dL — ABNORMAL LOW (ref 12.0–15.0)
MCH: 28.5 pg (ref 26.0–34.0)
MCHC: 31.1 g/dL (ref 30.0–36.0)
MCV: 91.4 fL (ref 80.0–100.0)
Platelets: 149 10*3/uL — ABNORMAL LOW (ref 150–400)
RBC: 3.83 MIL/uL — ABNORMAL LOW (ref 3.87–5.11)
RDW: 12.6 % (ref 11.5–15.5)
WBC: 10.8 10*3/uL — ABNORMAL HIGH (ref 4.0–10.5)
nRBC: 0 % (ref 0.0–0.2)

## 2023-03-02 LAB — ECHOCARDIOGRAM COMPLETE
Area-P 1/2: 3.06 cm2
Height: 69 in
S' Lateral: 3 cm
Weight: 2448 [oz_av]

## 2023-03-02 LAB — HEMOGLOBIN AND HEMATOCRIT, BLOOD
HCT: 32.4 % — ABNORMAL LOW (ref 36.0–46.0)
Hemoglobin: 10.3 g/dL — ABNORMAL LOW (ref 12.0–15.0)

## 2023-03-02 MED ORDER — ORAL CARE MOUTH RINSE: 15.0000 mL | OROMUCOSAL | Status: DC | PRN

## 2023-03-02 MED ORDER — CALCIUM GLUCONATE-NACL 2-0.675 GM/100ML-% IV SOLN
2.0000 g | Freq: Once | INTRAVENOUS | Status: AC
  Administered 2023-03-02: 2000 mg via INTRAVENOUS
  Filled 2023-03-02: qty 100

## 2023-03-02 NOTE — Plan of Care (Signed)
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
## 2023-03-02 NOTE — Progress Notes (Signed)
Echocardiogram 2D Echocardiogram has been performed.  [name] Wence RDCS 03/02/2023, 8:13 AM

## 2023-03-03 ENCOUNTER — Inpatient Hospital Stay (HOSPITAL_COMMUNITY): Payer: Medicare PPO

## 2023-03-03 DIAGNOSIS — I4891 Unspecified atrial fibrillation: Secondary | ICD-10-CM | POA: Diagnosis not present

## 2023-03-03 DIAGNOSIS — R55 Syncope and collapse: Secondary | ICD-10-CM

## 2023-03-03 LAB — CBC
HCT: 33.2 % — ABNORMAL LOW (ref 36.0–46.0)
Hemoglobin: 10.8 g/dL — ABNORMAL LOW (ref 12.0–15.0)
MCH: 29.7 pg (ref 26.0–34.0)
MCHC: 32.5 g/dL (ref 30.0–36.0)
MCV: 91.2 fL (ref 80.0–100.0)
Platelets: 141 10*3/uL — ABNORMAL LOW (ref 150–400)
RBC: 3.64 MIL/uL — ABNORMAL LOW (ref 3.87–5.11)
RDW: 12.5 % (ref 11.5–15.5)
WBC: 10 10*3/uL (ref 4.0–10.5)
nRBC: 0 % (ref 0.0–0.2)

## 2023-03-03 MED ORDER — AMIODARONE HCL IN DEXTROSE 360-4.14 MG/200ML-% IV SOLN
30.0000 mg/h | INTRAVENOUS | Status: DC
  Administered 2023-03-04 – 2023-03-05 (×4): 30 mg/h via INTRAVENOUS
  Filled 2023-03-03 (×4): qty 200

## 2023-03-03 MED ORDER — BOOST / RESOURCE BREEZE PO LIQD CUSTOM
1.0000 | Freq: Three times a day (TID) | ORAL | Status: DC
  Administered 2023-03-03 – 2023-03-08 (×8): 1 via ORAL

## 2023-03-03 MED ORDER — IPRATROPIUM-ALBUTEROL 0.5-2.5 (3) MG/3ML IN SOLN
3.0000 mL | Freq: Four times a day (QID) | RESPIRATORY_TRACT | Status: DC
  Administered 2023-03-03 – 2023-03-04 (×6): 3 mL via RESPIRATORY_TRACT
  Filled 2023-03-03 (×6): qty 3

## 2023-03-03 MED ORDER — AMIODARONE HCL IN DEXTROSE 360-4.14 MG/200ML-% IV SOLN
60.0000 mg/h | INTRAVENOUS | Status: AC
  Administered 2023-03-03: 60 mg/h via INTRAVENOUS
  Filled 2023-03-03 (×3): qty 200

## 2023-03-03 MED ORDER — AMIODARONE LOAD VIA INFUSION
150.0000 mg | Freq: Once | INTRAVENOUS | Status: AC
  Administered 2023-03-03: 150 mg via INTRAVENOUS
  Filled 2023-03-03: qty 83.34

## 2023-03-03 NOTE — Care Management Important Message (Signed)
Important Message  Patient Details  Name: Kelly [name] MRN: [id number] Date of Birth: 03-08-1946   Important Message Given:  Yes - Medicare IM     Sherilyn Banker 03/03/2023, 3:42 PM

## 2023-03-03 NOTE — Plan of Care (Signed)

## 2023-03-03 NOTE — Progress Notes (Addendum)
Patient flipped into a fib. Patient asymptomatic. Notified MD.

## 2023-03-03 NOTE — Consult Note (Signed)
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 mesenteric hematoma, grade 3 liver injury, acute T9 compression and hematoma of left middle scalene.   In ER was found to be in atrial fibrillation on admit with EKG showing nonspecific ST abnormality Eliquis was reversed in the ER due to injuries. Apparently she carries a hx of PAF on OSH records but the patient says that she was never told that she has afib.  Last EKG available in EPIC from 06/2021 showed NSR.  This afternoon developed increased HR and currently in afib at 150bpm.  She denied any CP, SOB, palpitations, diaphoresis or nausea prior to the accident.    Past Medical History:   Diagnosis Date   Acute gallstone pancreatitis s/p lap cholecystectomy 10/26/2015 10/24/2015   Choledocholithiasis 10/24/2015   Colon cancer [facility])    with surgery in Jan. 2022   COPD (chronic obstructive pulmonary disease) (HCC)     Past Surgical History:  Procedure Laterality Date   ABDOMINAL HYSTERECTOMY  06/2010   bladder tack     CHOLECYSTECTOMY N/A 10/26/2015   Procedure: LAPAROSCOPIC CHOLECYSTECTOMY WITH INTRAOPERATIVE CHOLANGIOGRAM;  Surgeon: Karie Soda, MD;  Location: [facility];  Service: General;  Laterality: N/A;   DILATION AND CURETTAGE OF UTERUS  06/2010   ERCP N/A 10/25/2015   Procedure: ENDOSCOPIC RETROGRADE CHOLANGIOPANCREATOGRAPHY (ERCP);  Surgeon: Rachael Fee, MD;  Location: [facility];  Service: Endoscopy;  Laterality: N/A;   HERNIA REPAIR     inguinal hernia repair - at 76 years old   TONSILLECTOMY       Home Medications:  Prior to Admission medications   Medication Sig Start Date End Date Taking? Authorizing Provider  cyanocobalamin (VITAMIN B12) 1000 MCG tablet Take 1,000 mcg by mouth 2 (two) times daily.   Yes [provider]  ELIQUIS 5 MG TABS tablet Take 5 mg by mouth in the morning and at bedtime.   Yes [provider]  levETIRAcetam (KEPPRA) 500 MG tablet TAKE 1 TABLET BY MOUTH TWICE A DAY 06/24/21  Yes [name], DO    Inpatient Medications: Scheduled Meds:  acetaminophen  1,000 mg Oral  DDIMER" in the last 168 hours.   Radiology/Studies:  DG CHEST PORT 1 VIEW  Result Date: 03/03/2023 CLINICAL DATA:  Shortness of breath rib fractures EXAM: PORTABLE CHEST 1 VIEW COMPARISON:  03/01/2023, CT 02/28/2023 FINDINGS: Emphysema. Small left-sided pleural effusion with increasing airspace disease at the bilateral left greater than right lung bases. Stable cardiomediastinal silhouette with aortic atherosclerosis. No pneumothorax is seen. IMPRESSION: Small left  effusion with increasing airspace disease at the left greater than right lung bases, atelectasis versus pneumonia. Electronically Signed   By: Jasmine Pang M.D.   On: 03/03/2023 16:36   ECHOCARDIOGRAM COMPLETE  Result Date: 03/02/2023    ECHOCARDIOGRAM REPORT   Patient Name:   Kelly [name] Date of Exam: 03/02/2023 Medical Rec #:  [id number]        Height:       69.0 in Accession #:    [id number]       Weight:       153.0 lb Date of Birth:  July 21, 1945        BSA:          1.844 m Patient Age:    77 years         BP:           96/54 mmHg Patient Gender: F                HR:           64 bpm. Exam Location:  Inpatient Procedure: 2D Echo, Color Doppler and Cardiac Doppler Indications:    I48.91* Unspecified atrial fibrillation  History:        Patient has prior history of Echocardiogram examinations, most                 recent 06/16/2021. COPD.  Sonographer:    Irving Burton Senior RDCS Referring Phys: [phone] GREGORY A METZGER IMPRESSIONS  1. Left ventricular ejection fraction, by estimation, is 60 to 65%. Left ventricular ejection fraction by PLAX is 58 %. The left ventricle has normal function. The left ventricle has no regional wall motion abnormalities. Left ventricular diastolic parameters were normal.  2. Right ventricular systolic function is normal. The right ventricular size is mildly enlarged. There is severely elevated pulmonary artery systolic pressure. The estimated right ventricular systolic pressure is 60.1 mmHg.  3. Right atrial size was mildly dilated.  4. The mitral valve is normal in structure. Trivial mitral valve regurgitation.  5. The aortic valve is tricuspid. There is mild calcification of the aortic valve. Aortic valve regurgitation is not visualized. Aortic valve sclerosis is present, with no evidence of aortic valve stenosis.  6. The inferior vena cava is dilated in size with >50% respiratory variability, suggesting right atrial pressure of 8 mmHg.  7. Cannot exclude a small PFO. FINDINGS   Left Ventricle: Left ventricular ejection fraction, by estimation, is 60 to 65%. Left ventricular ejection fraction by PLAX is 58 %. The left ventricle has normal function. The left ventricle has no regional wall motion abnormalities. The left ventricular internal cavity size was normal in size. There is no left ventricular hypertrophy. Left ventricular diastolic parameters were normal. Right Ventricle: The right ventricular size is mildly enlarged. No increase in right ventricular wall thickness. Right ventricular systolic function is normal. There is severely elevated pulmonary artery systolic pressure. The tricuspid regurgitant velocity is 3.61 m/s, and with an assumed right atrial pressure of 8 mmHg, the estimated right ventricular systolic pressure is 60.1 mmHg. Left Atrium: Left atrial size  DDIMER" in the last 168 hours.   Radiology/Studies:  DG CHEST PORT 1 VIEW  Result Date: 03/03/2023 CLINICAL DATA:  Shortness of breath rib fractures EXAM: PORTABLE CHEST 1 VIEW COMPARISON:  03/01/2023, CT 02/28/2023 FINDINGS: Emphysema. Small left-sided pleural effusion with increasing airspace disease at the bilateral left greater than right lung bases. Stable cardiomediastinal silhouette with aortic atherosclerosis. No pneumothorax is seen. IMPRESSION: Small left  effusion with increasing airspace disease at the left greater than right lung bases, atelectasis versus pneumonia. Electronically Signed   By: Jasmine Pang M.D.   On: 03/03/2023 16:36   ECHOCARDIOGRAM COMPLETE  Result Date: 03/02/2023    ECHOCARDIOGRAM REPORT   Patient Name:   Kelly [name] Date of Exam: 03/02/2023 Medical Rec #:  [id number]        Height:       69.0 in Accession #:    [id number]       Weight:       153.0 lb Date of Birth:  July 21, 1945        BSA:          1.844 m Patient Age:    77 years         BP:           96/54 mmHg Patient Gender: F                HR:           64 bpm. Exam Location:  Inpatient Procedure: 2D Echo, Color Doppler and Cardiac Doppler Indications:    I48.91* Unspecified atrial fibrillation  History:        Patient has prior history of Echocardiogram examinations, most                 recent 06/16/2021. COPD.  Sonographer:    Irving Burton Senior RDCS Referring Phys: [phone] GREGORY A METZGER IMPRESSIONS  1. Left ventricular ejection fraction, by estimation, is 60 to 65%. Left ventricular ejection fraction by PLAX is 58 %. The left ventricle has normal function. The left ventricle has no regional wall motion abnormalities. Left ventricular diastolic parameters were normal.  2. Right ventricular systolic function is normal. The right ventricular size is mildly enlarged. There is severely elevated pulmonary artery systolic pressure. The estimated right ventricular systolic pressure is 60.1 mmHg.  3. Right atrial size was mildly dilated.  4. The mitral valve is normal in structure. Trivial mitral valve regurgitation.  5. The aortic valve is tricuspid. There is mild calcification of the aortic valve. Aortic valve regurgitation is not visualized. Aortic valve sclerosis is present, with no evidence of aortic valve stenosis.  6. The inferior vena cava is dilated in size with >50% respiratory variability, suggesting right atrial pressure of 8 mmHg.  7. Cannot exclude a small PFO. FINDINGS   Left Ventricle: Left ventricular ejection fraction, by estimation, is 60 to 65%. Left ventricular ejection fraction by PLAX is 58 %. The left ventricle has normal function. The left ventricle has no regional wall motion abnormalities. The left ventricular internal cavity size was normal in size. There is no left ventricular hypertrophy. Left ventricular diastolic parameters were normal. Right Ventricle: The right ventricular size is mildly enlarged. No increase in right ventricular wall thickness. Right ventricular systolic function is normal. There is severely elevated pulmonary artery systolic pressure. The tricuspid regurgitant velocity is 3.61 m/s, and with an assumed right atrial pressure of 8 mmHg, the estimated right ventricular systolic pressure is 60.1 mmHg. Left Atrium: Left atrial size

## 2023-03-03 NOTE — Progress Notes (Signed)
PT Cancellation Note  Patient Details Name: Kelly [name] MRN: [id number] DOB: 1945-09-08   Cancelled Treatment:    Reason Eval/Treat Not Completed: Medical issues which prohibited therapy (A-fib with rate in 150's). Hold PT for now per cardiology.   Lyanne Co, PT  Acute Rehab Services Secure chat preferred Office [phone]    Elyse Hsu 03/03/2023, 4:40 PM

## 2023-03-03 NOTE — Progress Notes (Signed)
Patient's heart rate 120' to 140's.Patient asymptomatic. Notified MD. Waiting for cardiology to assess.
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
## 2023-03-03 NOTE — Progress Notes (Signed)
   03/03/23 1100  Assess: MEWS Score  Temp 98.5 F (36.9 C)  BP 129/82  MAP (mmHg) 87  Pulse Rate (!) 129  ECG Heart Rate (!) 125  Resp (!) 25  SpO2 (!) 88 %  Assess: MEWS Score  MEWS Temp 0  MEWS Systolic 0  MEWS Pulse 2  MEWS RR 1  MEWS LOC 0  MEWS Score 3  MEWS Score Color Yellow  Assess: if the MEWS score is Yellow or Red  Were vital signs accurate and taken at a resting state? Yes  Does the patient meet 2 or more of the SIRS criteria? No  MEWS guidelines implemented  Yes, yellow  Treat  MEWS Interventions Considered administering scheduled or prn medications/treatments as ordered  Take Vital Signs  Increase Vital Sign Frequency  Yellow: Q2hr x1, continue Q4hrs until patient remains green for 12hrs  Escalate  MEWS: Escalate Yellow: Discuss with charge nurse and consider notifying provider and/or RRT  Notify: Charge Nurse/RN  Name of Charge Nurse/RN Notified Jessica RN  Assess: SIRS CRITERIA  SIRS Temperature  0  SIRS Pulse 1  SIRS Respirations  1  SIRS WBC 0  SIRS Score Sum  2

## 2023-03-04 ENCOUNTER — Inpatient Hospital Stay (HOSPITAL_COMMUNITY): Payer: Medicare PPO

## 2023-03-04 ENCOUNTER — Encounter (HOSPITAL_COMMUNITY): Payer: Medicare PPO

## 2023-03-04 DIAGNOSIS — R55 Syncope and collapse: Secondary | ICD-10-CM | POA: Diagnosis not present

## 2023-03-04 DIAGNOSIS — T1490XA Injury, unspecified, initial encounter: Secondary | ICD-10-CM | POA: Diagnosis not present

## 2023-03-04 DIAGNOSIS — I4891 Unspecified atrial fibrillation: Secondary | ICD-10-CM | POA: Diagnosis not present

## 2023-03-04 DIAGNOSIS — R569 Unspecified convulsions: Secondary | ICD-10-CM

## 2023-03-04 DIAGNOSIS — G40909 Epilepsy, unspecified, not intractable, without status epilepticus: Secondary | ICD-10-CM | POA: Diagnosis not present

## 2023-03-04 LAB — BASIC METABOLIC PANEL
Anion gap: 10 (ref 5–15)
BUN: 12 mg/dL (ref 8–23)
CO2: 26 mmol/L (ref 22–32)
Calcium: 7.9 mg/dL — ABNORMAL LOW (ref 8.9–10.3)
Chloride: 102 mmol/L (ref 98–111)
Creatinine, Ser: 0.67 mg/dL (ref 0.44–1.00)
GFR, Estimated: >60 mL/min (ref >60)
Glucose, Bld: 121 mg/dL — ABNORMAL HIGH (ref 70–99)
Potassium: 3.5 mmol/L (ref 3.5–5.1)
Sodium: 138 mmol/L (ref 135–145)

## 2023-03-04 LAB — CBC
HCT: 33.2 % — ABNORMAL LOW (ref 36.0–46.0)
Hemoglobin: 10.8 g/dL — ABNORMAL LOW (ref 12.0–15.0)
MCH: 28.7 pg (ref 26.0–34.0)
MCHC: 32.5 g/dL (ref 30.0–36.0)
MCV: 88.3 fL (ref 80.0–100.0)
Platelets: 168 10*3/uL (ref 150–400)
RBC: 3.76 MIL/uL — ABNORMAL LOW (ref 3.87–5.11)
RDW: 12.6 % (ref 11.5–15.5)
WBC: 7.9 10*3/uL (ref 4.0–10.5)
nRBC: 0 % (ref 0.0–0.2)

## 2023-03-04 LAB — MAGNESIUM: Magnesium: 2 mg/dL (ref 1.7–2.4)

## 2023-03-04 LAB — TSH: TSH: 1.104 u[IU]/mL (ref 0.350–4.500)

## 2023-03-04 MED ORDER — POTASSIUM CHLORIDE CRYS ER 20 MEQ PO TBCR
40.0000 meq | EXTENDED_RELEASE_TABLET | Freq: Once | ORAL | Status: AC
  Administered 2023-03-04: 40 meq via ORAL
  Filled 2023-03-04: qty 2

## 2023-03-04 MED ORDER — ENOXAPARIN SODIUM 30 MG/0.3ML IJ SOSY
30.0000 mg | PREFILLED_SYRINGE | Freq: Two times a day (BID) | INTRAMUSCULAR | Status: DC
  Administered 2023-03-04 – 2023-03-09 (×11): 30 mg via SUBCUTANEOUS
  Filled 2023-03-04 (×11): qty 0.3

## 2023-03-04 MED ORDER — IOHEXOL 350 MG/ML SOLN
75.0000 mL | Freq: Once | INTRAVENOUS | Status: AC | PRN
  Administered 2023-03-04: 75 mL via INTRAVENOUS

## 2023-03-04 MED ORDER — IPRATROPIUM-ALBUTEROL 0.5-2.5 (3) MG/3ML IN SOLN
3.0000 mL | Freq: Three times a day (TID) | RESPIRATORY_TRACT | Status: DC
  Administered 2023-03-05: 3 mL via RESPIRATORY_TRACT
  Filled 2023-03-04: qty 3

## 2023-03-04 MED ORDER — GUAIFENESIN ER 600 MG PO TB12
600.0000 mg | ORAL_TABLET | Freq: Two times a day (BID) | ORAL | Status: DC
  Administered 2023-03-04 – 2023-03-11 (×15): 600 mg via ORAL
  Filled 2023-03-04 (×15): qty 1

## 2023-03-04 MED ORDER — LEVETIRACETAM 500 MG PO TABS
1000.0000 mg | ORAL_TABLET | Freq: Two times a day (BID) | ORAL | Status: DC
  Administered 2023-03-04 – 2023-03-11 (×14): 1000 mg via ORAL
  Filled 2023-03-04 (×14): qty 2

## 2023-03-04 NOTE — Progress Notes (Signed)
Patient O2 stas 97% on 1L of O2. Patient started to de sat to 67%. Changed pulse Ox and patient's O2 65%. Placed patient on 8L of O2 and sats 92%. Notified Trama RN and Dr. Derrell Lolling. Patient going down for stat CT. Order received.

## 2023-03-04 NOTE — Progress Notes (Signed)
Carotid artery duplex has been completed. Preliminary results can be found in CV Proc through chart review.   03/04/23 1:51 PM Olen Cordial RVT
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
## 2023-03-04 NOTE — Progress Notes (Signed)
Discussed with [place] radiology - no central PE noted on CT chest 02/28/23, however this was not a PE protocol. Given echo results, will check a CTA PE study to rule out PE.
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
## 2023-03-04 NOTE — Progress Notes (Signed)
EEG complete - results pending 
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
## 2023-03-04 NOTE — Plan of Care (Signed)
  Problem: Education: Goal: Knowledge of General Education information will improve Description: Including pain rating scale, medication(s)/side effects and non-pharmacologic comfort measures 03/04/2023 2034 by Yvone Neu, RN Outcome: Progressing 03/04/2023 2034 by Yvone Neu, RN Outcome: Progressing   Problem: Health Behavior/Discharge Planning: Goal: Ability to manage health-related needs will improve Outcome: Progressing   Problem: Clinical Measurements: Goal: Ability to maintain clinical measurements within normal limits will improve Outcome: Progressing Goal: Will remain free from infection Outcome: Progressing Goal: Diagnostic test results will improve Outcome: Progressing Goal: Respiratory complications will improve Outcome: Progressing Goal: Cardiovascular complication will be avoided Outcome: Progressing   Problem: Activity: Goal: Risk for activity intolerance will decrease Outcome: Progressing   Problem: Nutrition: Goal: Adequate nutrition will be maintained Outcome: Progressing   Problem: Coping: Goal: Level of anxiety will decrease Outcome: Progressing   Problem: Elimination: Goal: Will not experience complications related to bowel motility Outcome: Progressing Goal: Will not experience complications related to urinary retention Outcome: Progressing   Problem: Pain Managment: Goal: General experience of comfort will improve Outcome: Progressing   Problem: Safety: Goal: Ability to remain free from injury will improve Outcome: Progressing   Problem: Skin Integrity: Goal: Risk for impaired skin integrity will decrease Outcome: Progressing   Problem: Activity: Goal: Ability to perform activities at highest level will improve Outcome: Progressing Goal: Muscle strength will improve Outcome: Progressing   Problem: Bowel/Gastric: Goal: Ability to demonstrate the techniques of an individualized bowel program will improve Outcome:  Progressing   Problem: Education: Goal: Knowledge of disease or condition will improve Outcome: Progressing Goal: Knowledge of the prescribed therapeutic regimen will improve Outcome: Progressing   Problem: Coping: Goal: Ability to identify and develop effective coping behavior will improve Outcome: Progressing Goal: Ability to verbalize feelings will improve Outcome: Progressing   Problem: Self-Care: Goal: Ability to participate in self-care as condition permits will improve Outcome: Progressing   Problem: Skin Integrity: Goal: Risk for impaired skin integrity will decrease Outcome: Progressing   Problem: Urinary Elimination: Goal: Ability to achieve a regular elimination pattern will improve Outcome: Progressing   Problem: Education: Goal: Knowledge of disease or condition will improve Outcome: Progressing Goal: Understanding of medication regimen will improve Outcome: Progressing   Problem: Activity: Goal: Ability to tolerate increased activity will improve Outcome: Progressing   Problem: Cardiac: Goal: Ability to achieve and maintain adequate cardiopulmonary perfusion will improve Outcome: Progressing

## 2023-03-05 ENCOUNTER — Inpatient Hospital Stay (HOSPITAL_COMMUNITY): Payer: Medicare PPO

## 2023-03-05 DIAGNOSIS — I272 Pulmonary hypertension, unspecified: Secondary | ICD-10-CM | POA: Diagnosis not present

## 2023-03-05 DIAGNOSIS — I4891 Unspecified atrial fibrillation: Secondary | ICD-10-CM | POA: Diagnosis not present

## 2023-03-05 DIAGNOSIS — R55 Syncope and collapse: Secondary | ICD-10-CM | POA: Diagnosis not present

## 2023-03-05 DIAGNOSIS — I6521 Occlusion and stenosis of right carotid artery: Secondary | ICD-10-CM

## 2023-03-05 LAB — CBC
HCT: 33.8 % — ABNORMAL LOW (ref 36.0–46.0)
Hemoglobin: 10.7 g/dL — ABNORMAL LOW (ref 12.0–15.0)
MCH: 29.3 pg (ref 26.0–34.0)
MCHC: 31.7 g/dL (ref 30.0–36.0)
MCV: 92.6 fL (ref 80.0–100.0)
Platelets: 175 10*3/uL (ref 150–400)
RBC: 3.65 MIL/uL — ABNORMAL LOW (ref 3.87–5.11)
RDW: 12.6 % (ref 11.5–15.5)
WBC: 6.9 10*3/uL (ref 4.0–10.5)
nRBC: 0 % (ref 0.0–0.2)

## 2023-03-05 LAB — BASIC METABOLIC PANEL
Anion gap: 15 (ref 5–15)
BUN: 13 mg/dL (ref 8–23)
CO2: 26 mmol/L (ref 22–32)
Calcium: 8.1 mg/dL — ABNORMAL LOW (ref 8.9–10.3)
Chloride: 97 mmol/L — ABNORMAL LOW (ref 98–111)
Creatinine, Ser: 0.87 mg/dL (ref 0.44–1.00)
GFR, Estimated: >60 mL/min (ref >60)
Glucose, Bld: 107 mg/dL — ABNORMAL HIGH (ref 70–99)
Potassium: 3.8 mmol/L (ref 3.5–5.1)
Sodium: 138 mmol/L (ref 135–145)

## 2023-03-05 MED ORDER — IPRATROPIUM-ALBUTEROL 0.5-2.5 (3) MG/3ML IN SOLN
3.0000 mL | Freq: Two times a day (BID) | RESPIRATORY_TRACT | Status: DC
  Administered 2023-03-05 – 2023-03-10 (×10): 3 mL via RESPIRATORY_TRACT
  Filled 2023-03-05 (×10): qty 3

## 2023-03-05 MED ORDER — SIMETHICONE 80 MG PO CHEW
80.0000 mg | CHEWABLE_TABLET | Freq: Once | ORAL | Status: DC
  Filled 2023-03-05: qty 1

## 2023-03-05 MED ORDER — AMIODARONE HCL 200 MG PO TABS
400.0000 mg | ORAL_TABLET | Freq: Two times a day (BID) | ORAL | Status: DC
  Administered 2023-03-05 – 2023-03-06 (×3): 400 mg via ORAL
  Filled 2023-03-05 (×3): qty 2

## 2023-03-05 MED ORDER — SODIUM CHLORIDE 0.9 % IV SOLN: INTRAVENOUS | Status: DC

## 2023-03-05 NOTE — Progress Notes (Signed)
   03/05/23 1400  Spiritual Encounters  Type of Visit Initial  Care provided to: Patient  Referral source Nurse (RN/NT/LPN)  Reason for visit Routine spiritual support  OnCall Visit No  Spiritual Framework  Presenting Themes Meaning/purpose/sources of inspiration  Values/beliefs faith  Community/Connection Family  Interventions  Spiritual Care Interventions Made Compassionate presence;Reflective listening;Normalization of emotions;Meaning making   Ch responded to request for emotional and spiritual support. There was no family present at bedside. Pt is going through spiritual distress because she did not expect to be in the hospital for this long. She misses her home. She lives with her daughter and her grand kids. She feels stuck that makes her frustrated. Ch provided hospitality and asked guided questions to bring forth feelings. Ch remains available when needed.
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
## 2023-03-05 NOTE — Progress Notes (Signed)
Inpatient Rehab Admissions Coordinator Note:   Per therapy recommendations patient was screened for CIR candidacy by Stephania Fragmin, PT. At this time, pt appears to be a potential candidate for CIR. I will place an order for rehab consult for full assessment, per our protocol.  Please contact me any with questions.Estill Dooms, PT, DPT [phone] 03/05/23 1:49 PM
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
## 2023-03-05 NOTE — Plan of Care (Signed)
  Problem: Education: Goal: Knowledge of General Education information will improve Description: Including pain rating scale, medication(s)/side effects and non-pharmacologic comfort measures Outcome: Progressing   Problem: Health Behavior/Discharge Planning: Goal: Ability to manage health-related needs will improve Outcome: Progressing   Problem: Clinical Measurements: Goal: Ability to maintain clinical measurements within normal limits will improve Outcome: Progressing Goal: Will remain free from infection Outcome: Progressing Goal: Diagnostic test results will improve Outcome: Progressing Goal: Respiratory complications will improve Outcome: Progressing Goal: Cardiovascular complication will be avoided Outcome: Progressing   Problem: Activity: Goal: Risk for activity intolerance will decrease Outcome: Progressing   Problem: Nutrition: Goal: Adequate nutrition will be maintained Outcome: Progressing   Problem: Coping: Goal: Level of anxiety will decrease Outcome: Progressing   Problem: Elimination: Goal: Will not experience complications related to bowel motility Outcome: Progressing Goal: Will not experience complications related to urinary retention Outcome: Progressing   Problem: Pain Managment: Goal: General experience of comfort will improve Outcome: Progressing   Problem: Safety: Goal: Ability to remain free from injury will improve Outcome: Progressing   Problem: Skin Integrity: Goal: Risk for impaired skin integrity will decrease Outcome: Progressing   Problem: Activity: Goal: Ability to perform activities at highest level will improve Outcome: Progressing Goal: Muscle strength will improve Outcome: Progressing   Problem: Bowel/Gastric: Goal: Ability to demonstrate the techniques of an individualized bowel program will improve Outcome: Progressing   Problem: Education: Goal: Knowledge of disease or condition will improve Outcome:  Progressing Goal: Knowledge of the prescribed therapeutic regimen will improve Outcome: Progressing   Problem: Coping: Goal: Ability to identify and develop effective coping behavior will improve Outcome: Progressing Goal: Ability to verbalize feelings will improve Outcome: Progressing   Problem: Self-Care: Goal: Ability to participate in self-care as condition permits will improve Outcome: Progressing   Problem: Skin Integrity: Goal: Risk for impaired skin integrity will decrease Outcome: Progressing   Problem: Urinary Elimination: Goal: Ability to achieve a regular elimination pattern will improve Outcome: Progressing   Problem: Education: Goal: Knowledge of disease or condition will improve Outcome: Progressing Goal: Understanding of medication regimen will improve Outcome: Progressing   Problem: Activity: Goal: Ability to tolerate increased activity will improve Outcome: Progressing   Problem: Cardiac: Goal: Ability to achieve and maintain adequate cardiopulmonary perfusion will improve Outcome: Progressing
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
## 2023-03-06 ENCOUNTER — Inpatient Hospital Stay (HOSPITAL_COMMUNITY): Payer: Medicare PPO

## 2023-03-06 DIAGNOSIS — R55 Syncope and collapse: Secondary | ICD-10-CM | POA: Diagnosis not present

## 2023-03-06 DIAGNOSIS — I272 Pulmonary hypertension, unspecified: Secondary | ICD-10-CM | POA: Diagnosis not present

## 2023-03-06 DIAGNOSIS — I6521 Occlusion and stenosis of right carotid artery: Secondary | ICD-10-CM | POA: Diagnosis not present

## 2023-03-06 DIAGNOSIS — I4891 Unspecified atrial fibrillation: Secondary | ICD-10-CM | POA: Diagnosis not present

## 2023-03-06 LAB — ECHOCARDIOGRAM COMPLETE
AR max vel: 2.06 cm2
AV Peak grad: 8 mm[Hg]
Ao pk vel: 1.41 m/s
Area-P 1/2: 4.31 cm2
Height: 69 in
MV M vel: 4.23 m/s
MV Peak grad: 71.6 mm[Hg]
S' Lateral: 3 cm
Weight: 2448 [oz_av]

## 2023-03-06 LAB — BASIC METABOLIC PANEL
Anion gap: 12 (ref 5–15)
BUN: 15 mg/dL (ref 8–23)
CO2: 25 mmol/L (ref 22–32)
Calcium: 7.9 mg/dL — ABNORMAL LOW (ref 8.9–10.3)
Chloride: 104 mmol/L (ref 98–111)
Creatinine, Ser: 0.71 mg/dL (ref 0.44–1.00)
GFR, Estimated: >60 mL/min (ref >60)
Glucose, Bld: 94 mg/dL (ref 70–99)
Potassium: 3.6 mmol/L (ref 3.5–5.1)
Sodium: 141 mmol/L (ref 135–145)

## 2023-03-06 MED ORDER — AMIODARONE HCL 200 MG PO TABS
200.0000 mg | ORAL_TABLET | Freq: Two times a day (BID) | ORAL | Status: DC
  Administered 2023-03-06 – 2023-03-09 (×6): 200 mg via ORAL
  Filled 2023-03-06 (×6): qty 1

## 2023-03-06 MED ORDER — POTASSIUM CHLORIDE CRYS ER 20 MEQ PO TBCR
40.0000 meq | EXTENDED_RELEASE_TABLET | Freq: Once | ORAL | Status: AC
  Administered 2023-03-06: 40 meq via ORAL
  Filled 2023-03-06: qty 2
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
## 2023-03-06 NOTE — Plan of Care (Signed)
  Problem: Education: Goal: Knowledge of General Education information will improve Description: Including pain rating scale, medication(s)/side effects and non-pharmacologic comfort measures Outcome: Progressing   Problem: Health Behavior/Discharge Planning: Goal: Ability to manage health-related needs will improve Outcome: Progressing   Problem: Clinical Measurements: Goal: Ability to maintain clinical measurements within normal limits will improve Outcome: Progressing Goal: Will remain free from infection Outcome: Progressing Goal: Diagnostic test results will improve Outcome: Progressing Goal: Respiratory complications will improve Outcome: Progressing Goal: Cardiovascular complication will be avoided Outcome: Progressing   Problem: Activity: Goal: Risk for activity intolerance will decrease Outcome: Progressing   Problem: Nutrition: Goal: Adequate nutrition will be maintained Outcome: Progressing   Problem: Coping: Goal: Level of anxiety will decrease Outcome: Progressing   Problem: Elimination: Goal: Will not experience complications related to bowel motility Outcome: Progressing Goal: Will not experience complications related to urinary retention Outcome: Progressing   Problem: Pain Managment: Goal: General experience of comfort will improve Outcome: Progressing   Problem: Safety: Goal: Ability to remain free from injury will improve Outcome: Progressing   Problem: Skin Integrity: Goal: Risk for impaired skin integrity will decrease Outcome: Progressing   Problem: Activity: Goal: Ability to perform activities at highest level will improve Outcome: Progressing Goal: Muscle strength will improve Outcome: Progressing   Problem: Bowel/Gastric: Goal: Ability to demonstrate the techniques of an individualized bowel program will improve Outcome: Progressing   Problem: Education: Goal: Knowledge of disease or condition will improve Outcome:  Progressing Goal: Knowledge of the prescribed therapeutic regimen will improve Outcome: Progressing   Problem: Self-Care: Goal: Ability to participate in self-care as condition permits will improve Outcome: Progressing   Problem: Skin Integrity: Goal: Risk for impaired skin integrity will decrease Outcome: Progressing   Problem: Urinary Elimination: Goal: Ability to achieve a regular elimination pattern will improve Outcome: Progressing   Problem: Education: Goal: Knowledge of disease or condition will improve Outcome: Progressing Goal: Understanding of medication regimen will improve Outcome: Progressing   Problem: Activity: Goal: Ability to tolerate increased activity will improve Outcome: Progressing   Problem: Cardiac: Goal: Ability to achieve and maintain adequate cardiopulmonary perfusion will improve Outcome: Progressing

## 2023-03-06 NOTE — Progress Notes (Addendum)
  Inpatient Rehabilitation Admissions Coordinator   Met with patient  at bedside for rehab assessment. We discussed goals and expectations of a possible CIR admit. She lives with her daughter and daughter works. Patient is hopeful that she would not need 24/7 caregiver supports at home. Cir bed not readily available to pursue at this time. I recommended that she discuss with her daughter what caregiver supports she can arrange and that we see how she does with mobility over the weekend. Other rehab venues may need to be considered.. Please call me with any questions.   Ottie Glazier, RN, MSN Rehab Admissions Coordinator [phone]  Notified by PT that recommendations to be changed to [facility]. We will sign off at this time.  Ottie Glazier, RN, MSN Rehab Admissions Coordinator [phone] 03/06/2023 1:47 PM

## 2023-03-06 NOTE — Progress Notes (Signed)
Echocardiogram 2D Echocardiogram has been performed.  Lucendia Herrlich 03/06/2023, 10:21 AM

## 2023-03-06 NOTE — Progress Notes (Signed)
Attempted Echocardiogram, Patient is not in the bed.
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
## 2023-03-07 DIAGNOSIS — I480 Paroxysmal atrial fibrillation: Secondary | ICD-10-CM

## 2023-03-07 LAB — CBC
HCT: 32.7 % — ABNORMAL LOW (ref 36.0–46.0)
Hemoglobin: 9.9 g/dL — ABNORMAL LOW (ref 12.0–15.0)
MCH: 28.5 pg (ref 26.0–34.0)
MCHC: 30.3 g/dL (ref 30.0–36.0)
MCV: 94.2 fL (ref 80.0–100.0)
Platelets: 227 10*3/uL (ref 150–400)
RBC: 3.47 MIL/uL — ABNORMAL LOW (ref 3.87–5.11)
RDW: 12.7 % (ref 11.5–15.5)
WBC: 6 10*3/uL (ref 4.0–10.5)
nRBC: 0 % (ref 0.0–0.2)

## 2023-03-07 MED ORDER — OXYCODONE HCL 5 MG PO TABS
5.0000 mg | ORAL_TABLET | ORAL | Status: DC | PRN
  Administered 2023-03-07: 5 mg via ORAL
  Administered 2023-03-07 – 2023-03-11 (×14): 10 mg via ORAL
  Filled 2023-03-07 (×2): qty 2
  Filled 2023-03-07: qty 1
  Filled 2023-03-07 (×12): qty 2

## 2023-03-07 MED ORDER — KETOROLAC TROMETHAMINE 15 MG/ML IJ SOLN
15.0000 mg | Freq: Four times a day (QID) | INTRAMUSCULAR | Status: DC
  Administered 2023-03-07 – 2023-03-11 (×18): 15 mg via INTRAVENOUS
  Filled 2023-03-07 (×18): qty 1

## 2023-03-07 MED ORDER — METHOCARBAMOL 500 MG PO TABS
1000.0000 mg | ORAL_TABLET | Freq: Three times a day (TID) | ORAL | Status: DC
  Administered 2023-03-07 – 2023-03-09 (×7): 1000 mg via ORAL
  Filled 2023-03-07 (×7): qty 2

## 2023-03-07 NOTE — Plan of Care (Signed)
  Problem: Education: Goal: Knowledge of General Education information will improve Description: Including pain rating scale, medication(s)/side effects and non-pharmacologic comfort measures Outcome: Progressing   Problem: Health Behavior/Discharge Planning: Goal: Ability to manage health-related needs will improve Outcome: Progressing   Problem: Clinical Measurements: Goal: Ability to maintain clinical measurements within normal limits will improve Outcome: Progressing Goal: Will remain free from infection Outcome: Progressing Goal: Diagnostic test results will improve Outcome: Progressing Goal: Respiratory complications will improve Outcome: Progressing Goal: Cardiovascular complication will be avoided Outcome: Progressing   Problem: Nutrition: Goal: Adequate nutrition will be maintained Outcome: Progressing   Problem: Coping: Goal: Level of anxiety will decrease Outcome: Progressing   Problem: Pain Managment: Goal: General experience of comfort will improve Outcome: Progressing   Problem: Activity: Goal: Ability to perform activities at highest level will improve Outcome: Progressing Goal: Muscle strength will improve Outcome: Progressing   Problem: Bowel/Gastric: Goal: Ability to demonstrate the techniques of an individualized bowel program will improve Outcome: Progressing

## 2023-03-07 NOTE — Plan of Care (Signed)
  Problem: Education: Goal: Knowledge of General Education information will improve Description: Including pain rating scale, medication(s)/side effects and non-pharmacologic comfort measures Outcome: Progressing   Problem: Health Behavior/Discharge Planning: Goal: Ability to manage health-related needs will improve Outcome: Progressing   Problem: Clinical Measurements: Goal: Ability to maintain clinical measurements within normal limits will improve Outcome: Progressing Goal: Will remain free from infection Outcome: Progressing Goal: Diagnostic test results will improve Outcome: Progressing Goal: Respiratory complications will improve Outcome: Progressing Goal: Cardiovascular complication will be avoided Outcome: Progressing   Problem: Activity: Goal: Risk for activity intolerance will decrease Outcome: Progressing   Problem: Nutrition: Goal: Adequate nutrition will be maintained Outcome: Progressing   Problem: Coping: Goal: Level of anxiety will decrease Outcome: Progressing   Problem: Elimination: Goal: Will not experience complications related to bowel motility Outcome: Progressing Goal: Will not experience complications related to urinary retention Outcome: Progressing   Problem: Pain Managment: Goal: General experience of comfort will improve Outcome: Progressing   Problem: Safety: Goal: Ability to remain free from injury will improve Outcome: Progressing   Problem: Skin Integrity: Goal: Risk for impaired skin integrity will decrease Outcome: Progressing   Problem: Activity: Goal: Ability to perform activities at highest level will improve Outcome: Progressing Goal: Muscle strength will improve Outcome: Progressing   Problem: Bowel/Gastric: Goal: Ability to demonstrate the techniques of an individualized bowel program will improve Outcome: Progressing   Problem: Education: Goal: Knowledge of disease or condition will improve Outcome:  Progressing Goal: Knowledge of the prescribed therapeutic regimen will improve Outcome: Progressing   Problem: Coping: Goal: Ability to identify and develop effective coping behavior will improve Outcome: Progressing Goal: Ability to verbalize feelings will improve Outcome: Progressing   Problem: Self-Care: Goal: Ability to participate in self-care as condition permits will improve Outcome: Progressing   Problem: Skin Integrity: Goal: Risk for impaired skin integrity will decrease Outcome: Progressing   Problem: Urinary Elimination: Goal: Ability to achieve a regular elimination pattern will improve Outcome: Progressing   Problem: Education: Goal: Knowledge of disease or condition will improve Outcome: Progressing Goal: Understanding of medication regimen will improve Outcome: Progressing   Problem: Activity: Goal: Ability to tolerate increased activity will improve Outcome: Progressing   Problem: Cardiac: Goal: Ability to achieve and maintain adequate cardiopulmonary perfusion will improve Outcome: Progressing
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
## 2023-03-08 MED ORDER — MELATONIN 3 MG PO TABS: 3.0000 mg | ORAL_TABLET | Freq: Every evening | ORAL | Status: DC | PRN

## 2023-03-08 NOTE — Progress Notes (Signed)
Chart reviewed, no clinical status change.  No need for clinical visit today we will follow peripherally.
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
## 2023-03-08 NOTE — Plan of Care (Signed)
  Problem: Education: Goal: Knowledge of General Education information will improve Description: Including pain rating scale, medication(s)/side effects and non-pharmacologic comfort measures Outcome: Progressing   Problem: Health Behavior/Discharge Planning: Goal: Ability to manage health-related needs will improve Outcome: Progressing   Problem: Clinical Measurements: Goal: Ability to maintain clinical measurements within normal limits will improve Outcome: Progressing Goal: Will remain free from infection Outcome: Progressing Goal: Diagnostic test results will improve Outcome: Progressing Goal: Respiratory complications will improve Outcome: Progressing Goal: Cardiovascular complication will be avoided Outcome: Progressing   Problem: Activity: Goal: Risk for activity intolerance will decrease Outcome: Progressing   Problem: Nutrition: Goal: Adequate nutrition will be maintained Outcome: Progressing   Problem: Coping: Goal: Level of anxiety will decrease Outcome: Progressing   Problem: Elimination: Goal: Will not experience complications related to bowel motility Outcome: Progressing Goal: Will not experience complications related to urinary retention Outcome: Progressing   Problem: Pain Managment: Goal: General experience of comfort will improve Outcome: Progressing   Problem: Safety: Goal: Ability to remain free from injury will improve Outcome: Progressing   Problem: Skin Integrity: Goal: Risk for impaired skin integrity will decrease Outcome: Progressing   Problem: Education: Goal: Knowledge of disease or condition will improve Outcome: Progressing Goal: Knowledge of the prescribed therapeutic regimen will improve Outcome: Progressing   Problem: Skin Integrity: Goal: Risk for impaired skin integrity will decrease Outcome: Progressing   Problem: Self-Care: Goal: Ability to participate in self-care as condition permits will improve Outcome:  Progressing   Problem: Urinary Elimination: Goal: Ability to achieve a regular elimination pattern will improve Outcome: Progressing   Problem: Education: Goal: Knowledge of disease or condition will improve Outcome: Progressing Goal: Understanding of medication regimen will improve Outcome: Progressing   Problem: Activity: Goal: Ability to tolerate increased activity will improve Outcome: Progressing   Problem: Cardiac: Goal: Ability to achieve and maintain adequate cardiopulmonary perfusion will improve Outcome: Progressing

## 2023-03-08 NOTE — Progress Notes (Signed)
Dr. Bedelia Person was text paged that  pt has 9/10 sharp aching pain left arm. Dilaudid 1mg  IV given at 0410 no relief. Pt wants stronger pain med.

## 2023-03-09 DIAGNOSIS — R55 Syncope and collapse: Secondary | ICD-10-CM | POA: Diagnosis not present

## 2023-03-09 DIAGNOSIS — I272 Pulmonary hypertension, unspecified: Secondary | ICD-10-CM | POA: Diagnosis not present

## 2023-03-09 DIAGNOSIS — I4891 Unspecified atrial fibrillation: Secondary | ICD-10-CM | POA: Diagnosis not present

## 2023-03-09 LAB — HEMOGLOBIN AND HEMATOCRIT, BLOOD
HCT: 34.6 % — ABNORMAL LOW (ref 36.0–46.0)
Hemoglobin: 10.6 g/dL — ABNORMAL LOW (ref 12.0–15.0)

## 2023-03-09 MED ORDER — APIXABAN 5 MG PO TABS
5.0000 mg | ORAL_TABLET | Freq: Two times a day (BID) | ORAL | Status: DC
  Administered 2023-03-09 – 2023-03-11 (×4): 5 mg via ORAL
  Filled 2023-03-09 (×4): qty 1

## 2023-03-09 MED ORDER — AMIODARONE HCL 200 MG PO TABS
200.0000 mg | ORAL_TABLET | Freq: Every day | ORAL | Status: DC
  Administered 2023-03-11: 200 mg via ORAL
  Filled 2023-03-09: qty 1

## 2023-03-09 MED ORDER — PSYLLIUM 95 % PO PACK
1.0000 | PACK | Freq: Every day | ORAL | Status: DC
  Filled 2023-03-09: qty 1

## 2023-03-09 MED ORDER — LIDOCAINE 5 % EX PTCH
1.0000 | MEDICATED_PATCH | CUTANEOUS | Status: DC
  Administered 2023-03-09 – 2023-03-11 (×2): 1 via TRANSDERMAL
  Filled 2023-03-09 (×3): qty 1

## 2023-03-09 MED ORDER — PSYLLIUM 95 % PO PACK
1.0000 | PACK | Freq: Every day | ORAL | Status: DC
  Administered 2023-03-09 – 2023-03-11 (×2): 1 via ORAL
  Filled 2023-03-09 (×3): qty 1

## 2023-03-09 MED ORDER — AMIODARONE HCL 200 MG PO TABS
200.0000 mg | ORAL_TABLET | Freq: Two times a day (BID) | ORAL | Status: AC
  Administered 2023-03-09 – 2023-03-10 (×3): 200 mg via ORAL
  Filled 2023-03-09 (×3): qty 1

## 2023-03-09 MED ORDER — ATORVASTATIN CALCIUM 40 MG PO TABS
40.0000 mg | ORAL_TABLET | Freq: Every day | ORAL | Status: DC
  Administered 2023-03-09 – 2023-03-11 (×3): 40 mg via ORAL
  Filled 2023-03-09 (×3): qty 1

## 2023-03-09 MED ORDER — METHOCARBAMOL 500 MG PO TABS
1000.0000 mg | ORAL_TABLET | Freq: Four times a day (QID) | ORAL | Status: DC | PRN
  Administered 2023-03-09: 1000 mg via ORAL
  Filled 2023-03-09: qty 2
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
## 2023-03-09 NOTE — Plan of Care (Signed)
Patient getting out bed able to feed self and provide ADL care patient management is a concern still patient also requiring 0xygen which she didn't wear at home prior plan is to go home tomorrow with 02 and a walker Problem: Education: Goal: Knowledge of General Education information will improve Description: Including pain rating scale, medication(s)/side effects and non-pharmacologic comfort measures Outcome: Progressing   Problem: Health Behavior/Discharge Planning: Goal: Ability to manage health-related needs will improve Outcome: Progressing   Problem: Clinical Measurements: Goal: Ability to maintain clinical measurements within normal limits will improve Outcome: Progressing Goal: Will remain free from infection Outcome: Progressing Goal: Diagnostic test results will improve Outcome: Progressing Goal: Respiratory complications will improve Outcome: Progressing Goal: Cardiovascular complication will be avoided Outcome: Progressing   Problem: Activity: Goal: Risk for activity intolerance will decrease Outcome: Progressing   Problem: Nutrition: Goal: Adequate nutrition will be maintained Outcome: Progressing   Problem: Coping: Goal: Level of anxiety will decrease Outcome: Progressing   Problem: Elimination: Goal: Will not experience complications related to bowel motility Outcome: Progressing Goal: Will not experience complications related to urinary retention Outcome: Progressing   Problem: Pain Managment: Goal: General experience of comfort will improve Outcome: Not Progressing   Problem: Safety: Goal: Ability to remain free from injury will improve Outcome: Progressing   Problem: Skin Integrity: Goal: Risk for impaired skin integrity will decrease Outcome: Progressing   Problem: Activity: Goal: Ability to perform activities at highest level will improve Outcome: Progressing Goal: Muscle strength will improve Outcome: Progressing   Problem:  Bowel/Gastric: Goal: Ability to demonstrate the techniques of an individualized bowel program will improve Outcome: Progressing   Problem: Education: Goal: Knowledge of disease or condition will improve Outcome: Progressing Goal: Knowledge of the prescribed therapeutic regimen will improve Outcome: Progressing   Problem: Coping: Goal: Ability to identify and develop effective coping behavior will improve Outcome: Progressing Goal: Ability to verbalize feelings will improve Outcome: Progressing   Problem: Self-Care: Goal: Ability to participate in self-care as condition permits will improve Outcome: Progressing   Problem: Skin Integrity: Goal: Risk for impaired skin integrity will decrease Outcome: Progressing   Problem: Urinary Elimination: Goal: Ability to achieve a regular elimination pattern will improve Outcome: Progressing   Problem: Education: Goal: Knowledge of disease or condition will improve Outcome: Progressing Goal: Understanding of medication regimen will improve Outcome: Progressing   Problem: Activity: Goal: Ability to tolerate increased activity will improve Outcome: Progressing   Problem: Cardiac: Goal: Ability to achieve and maintain adequate cardiopulmonary perfusion will improve Outcome: Progressing

## 2023-03-09 NOTE — Progress Notes (Addendum)
SATURATION QUALIFICATIONS: (This note is used to comply with regulatory documentation for home oxygen)  Patient Saturations on Room Air at Rest = 81%  Patient Saturations on Room Air while Ambulating = N/A, pt on 2L at rest to maintain >88% at rest  Patient Saturations on 3 Liters of oxygen while Ambulating = 90%  Please briefly explain why patient needs home oxygen: Pt needs 2L O2 to maintain SpO2 >88% at rest and 3L O2 to maintain SpO2 >88% with activity.   Vickki Muff, PT, DPT   Acute Rehabilitation Department Office [phone] Secure Chat Communication Preferred

## 2023-03-09 NOTE — Care Management Important Message (Signed)
Important Message  Patient Details  Name: Kelly [name] MRN: [id number] Date of Birth: 04-11-1946   Important Message Given:  Yes - Medicare IM     Sherilyn Banker 03/09/2023, 1:36 PM

## 2023-03-09 NOTE — Plan of Care (Signed)
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
## 2023-03-10 LAB — CBC WITH DIFFERENTIAL/PLATELET
Abs Immature Granulocytes: 0.08 10*3/uL — ABNORMAL HIGH (ref 0.00–0.07)
Basophils Absolute: 0 10*3/uL (ref 0.0–0.1)
Basophils Relative: 0 %
Eosinophils Absolute: 0.2 10*3/uL (ref 0.0–0.5)
Eosinophils Relative: 2 %
HCT: 31.6 % — ABNORMAL LOW (ref 36.0–46.0)
Hemoglobin: 10.2 g/dL — ABNORMAL LOW (ref 12.0–15.0)
Immature Granulocytes: 1 %
Lymphocytes Relative: 11 %
Lymphs Abs: 1 10*3/uL (ref 0.7–4.0)
MCH: 29.9 pg (ref 26.0–34.0)
MCHC: 32.3 g/dL (ref 30.0–36.0)
MCV: 92.7 fL (ref 80.0–100.0)
Monocytes Absolute: 0.5 10*3/uL (ref 0.1–1.0)
Monocytes Relative: 6 %
Neutro Abs: 7.1 10*3/uL (ref 1.7–7.7)
Neutrophils Relative %: 80 %
Platelets: 272 10*3/uL (ref 150–400)
RBC: 3.41 MIL/uL — ABNORMAL LOW (ref 3.87–5.11)
RDW: 13 % (ref 11.5–15.5)
WBC: 8.8 10*3/uL (ref 4.0–10.5)
nRBC: 0 % (ref 0.0–0.2)

## 2023-03-10 LAB — BASIC METABOLIC PANEL
Anion gap: 8 (ref 5–15)
BUN: 18 mg/dL (ref 8–23)
CO2: 28 mmol/L (ref 22–32)
Calcium: 8.1 mg/dL — ABNORMAL LOW (ref 8.9–10.3)
Chloride: 104 mmol/L (ref 98–111)
Creatinine, Ser: 0.77 mg/dL (ref 0.44–1.00)
GFR, Estimated: >60 mL/min (ref >60)
Glucose, Bld: 102 mg/dL — ABNORMAL HIGH (ref 70–99)
Potassium: 4 mmol/L (ref 3.5–5.1)
Sodium: 140 mmol/L (ref 135–145)

## 2023-03-10 MED ORDER — IPRATROPIUM-ALBUTEROL 0.5-2.5 (3) MG/3ML IN SOLN: 3.0000 mL | RESPIRATORY_TRACT | Status: DC | PRN

## 2023-03-10 NOTE — Discharge Instructions (Signed)
-   Seizure precautions to include no driving until seizure free for 6 months, no swimming unsupervised, to take showers instead of baths and no operating of dangerous or heavy equipment.

## 2023-03-10 NOTE — Progress Notes (Signed)
Pt had another episode of bright red blood this writer noted after assisted pt up from the toilet. Trauma nurse notified by other Rn who assisted with pt care.
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
## 2023-03-10 NOTE — Plan of Care (Signed)

## 2023-03-10 NOTE — Progress Notes (Signed)
Pt was having bleeding from rectum, pt has Hemorrhoids at baseline, but bleeding appears to be more significant tonight to patient and RN who has assisted in her care.  RN assisted pt up from toilet and toilet bowl was full of bright red blood and spots on the floor trailing from the bed to the bathroom.  RN checked patients last hemoglobin, noticed a CBC was not drawn today.  Called and spoke with Irving Burton, Trauma RN, and notified her of situation and received a verbal order for CBC.  CBC order was placed by RN.

## 2023-03-11 ENCOUNTER — Other Ambulatory Visit (HOSPITAL_COMMUNITY): Payer: Self-pay

## 2023-03-11 LAB — CBC
HCT: 31.1 % — ABNORMAL LOW (ref 36.0–46.0)
Hemoglobin: 9.8 g/dL — ABNORMAL LOW (ref 12.0–15.0)
MCH: 29.5 pg (ref 26.0–34.0)
MCHC: 31.5 g/dL (ref 30.0–36.0)
MCV: 93.7 fL (ref 80.0–100.0)
Platelets: 293 10*3/uL (ref 150–400)
RBC: 3.32 MIL/uL — ABNORMAL LOW (ref 3.87–5.11)
RDW: 13.3 % (ref 11.5–15.5)
WBC: 6.9 10*3/uL (ref 4.0–10.5)
nRBC: 0 % (ref 0.0–0.2)

## 2023-03-11 MED ORDER — PSYLLIUM 95 % PO PACK: 1.0000 | PACK | Freq: Every day | ORAL | Status: DC

## 2023-03-11 MED ORDER — OXYCODONE HCL 10 MG PO TABS
10.0000 mg | ORAL_TABLET | ORAL | 0 refills | Status: AC | PRN
  Filled 2023-03-11: qty 25, 5d supply, fill #0

## 2023-03-11 MED ORDER — ACETAMINOPHEN 500 MG PO TABS
1000.0000 mg | ORAL_TABLET | Freq: Four times a day (QID) | ORAL | 0 refills | Status: DC
  Filled 2023-03-11: qty 30, 4d supply, fill #0

## 2023-03-11 MED ORDER — AMIODARONE HCL 200 MG PO TABS
200.0000 mg | ORAL_TABLET | Freq: Every day | ORAL | 0 refills | Status: DC
Start: 2023-03-12 — End: 2023-04-15
  Filled 2023-03-11: qty 40, 40d supply, fill #0

## 2023-03-11 MED ORDER — DOCUSATE SODIUM 100 MG PO CAPS: 100.0000 mg | ORAL_CAPSULE | Freq: Two times a day (BID) | ORAL | Status: AC

## 2023-03-11 MED ORDER — METHOCARBAMOL 500 MG PO TABS
1000.0000 mg | ORAL_TABLET | Freq: Four times a day (QID) | ORAL | 0 refills | Status: DC | PRN
  Filled 2023-03-11: qty 100, 13d supply, fill #0

## 2023-03-11 MED ORDER — IBUPROFEN 800 MG PO TABS: 400.0000 mg | ORAL_TABLET | Freq: Three times a day (TID) | ORAL | Status: DC | PRN

## 2023-03-11 MED ORDER — POLYETHYLENE GLYCOL 3350 17 G PO PACK: 17.0000 g | PACK | Freq: Every day | ORAL | Status: DC | PRN

## 2023-03-11 MED ORDER — LEVETIRACETAM 1000 MG PO TABS
1000.0000 mg | ORAL_TABLET | Freq: Two times a day (BID) | ORAL | 0 refills | Status: AC
  Filled 2023-03-11: qty 60, 30d supply, fill #0

## 2023-03-11 MED ORDER — ATORVASTATIN CALCIUM 40 MG PO TABS
40.0000 mg | ORAL_TABLET | Freq: Every day | ORAL | 0 refills | Status: DC
  Filled 2023-03-11: qty 30, 30d supply, fill #0

## 2023-03-11 NOTE — TOC Transition Note (Signed)
Transition of Care [facility]) - CM/SW Discharge Note   Patient Details  Name: Kelly [name] MRN: [id number] Date of Birth: 28-Feb-1946  Transition of Care [facility]) CM/SW Contact:  Glennon Mac, RN Phone Number: 03/11/2023, 11:37am  Clinical Narrative:    Patient medically stable for discharge home today with family to provide needed assistance. HH services to follow at home, as previously arranged.  Patient has received portable oxygen and rollator walker.     Final next level of care: Home w Home Health Services Barriers to Discharge: Barriers Resolved   Patient Goals and CMS Choice CMS Medicare.gov Compare Post Acute Care list provided to:: Patient Choice offered to / list presented to : Patient                        Discharge Plan and Services Additional resources added to the After Visit Summary for     Discharge Planning Services: CM Consult Post Acute Care Choice: Home Health          DME Arranged: Walker rolling with seat, Oxygen DME Agency: [employer] Date DME Agency Contacted: 03/09/23 Time DME Agency Contacted: 1705 Representative spoke with at DME Agency: Mickeal Needy HH Arranged: PT, OT [facility] Agency: [facility] Date [facility] Agency Contacted: 03/09/23 Time HH Agency Contacted: 1705 Representative spoke with at [facility] Agency: Lorenza Chick  Social Determinants of Health (SDOH) Interventions SDOH Screenings   Food Insecurity: No Food Insecurity (03/04/2023)  Housing: Low Risk  (03/04/2023)  Transportation Needs: No Transportation Needs (03/04/2023)  Utilities: Not At Risk (03/04/2023)  Financial Resource Strain: Patient Declined (01/11/2023)   Received from [facility]  Physical Activity: Unknown (01/11/2023)   Received from [facility]  Social Connections: Patient Declined (12/19/2022)   Received from [facility]  Stress: No Stress Concern Present (01/19/2023)   Received from [facility]  Tobacco Use: Medium Risk (02/28/2023)  Health Literacy: Patient  Declined (12/19/2022)   Received from [facility]     Readmission Risk Interventions     No data to display         Quintella Baton, RN, BSN  Trauma/Neuro ICU Case Manager [phone]
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
## 2023-04-14 ENCOUNTER — Encounter: Payer: Self-pay | Admitting: Physician Assistant

## 2023-04-14 ENCOUNTER — Other Ambulatory Visit: Payer: Self-pay

## 2023-04-14 ENCOUNTER — Ambulatory Visit: Payer: Medicare PPO | Attending: Physician Assistant | Admitting: Physician Assistant

## 2023-04-14 VITALS — BP 124/80 | HR 57 | Ht 69.0 in | Wt 154.0 lb

## 2023-04-14 DIAGNOSIS — I272 Pulmonary hypertension, unspecified: Secondary | ICD-10-CM | POA: Diagnosis not present

## 2023-04-14 DIAGNOSIS — Z79899 Other long term (current) drug therapy: Secondary | ICD-10-CM

## 2023-04-14 DIAGNOSIS — R55 Syncope and collapse: Secondary | ICD-10-CM

## 2023-04-14 DIAGNOSIS — I480 Paroxysmal atrial fibrillation: Secondary | ICD-10-CM | POA: Diagnosis not present

## 2023-04-14 DIAGNOSIS — I6529 Occlusion and stenosis of unspecified carotid artery: Secondary | ICD-10-CM

## 2023-04-14 MED ORDER — ATORVASTATIN CALCIUM 40 MG PO TABS: 40.0000 mg | ORAL_TABLET | Freq: Every day | ORAL | 3 refills | Status: AC

## 2023-04-14 NOTE — Patient Instructions (Addendum)
Medication Instructions:  Your physician recommends that you continue on your current medications as directed. Please refer to the Current Medication list given to you today.  *If you need a refill on your cardiac medications before your next appointment, please call your pharmacy*  Lab Work: In 3 months (1-2 weeks prior to appointment with Dr. Mayford Knife): fasting Lipid panel If you have labs (blood work) drawn today and your tests are completely normal, you will receive your results only by: MyChart Message (if you have MyChart) OR A paper copy in the mail If you have any lab test that is abnormal or we need to change your treatment, we will call you to review the results.  Testing/Procedures: None ordered today.  Follow-Up: At [facility], you and your health needs are our priority.  As part of our continuing mission to provide you with exceptional heart care, we have created designated Provider Care Teams.  These Care Teams include your primary Cardiologist (physician) and Advanced Practice Providers (APPs -  Physician Assistants and Nurse Practitioners) who all work together to provide you with the care you need, when you need it.  We recommend signing up for the patient portal called "MyChart".  Sign up information is provided on this After Visit Summary.  MyChart is used to connect with patients for Virtual Visits (Telemedicine).  Patients are able to view lab/test results, encounter notes, upcoming appointments, etc.  Non-urgent messages can be sent to your provider as well.   To learn more about what you can do with MyChart, go to [email or web address].    Your next appointment:   3 month(s)  The format for your next appointment:   In Person  Provider:   Armanda Magic, MD

## 2023-04-15 ENCOUNTER — Telehealth: Payer: Self-pay | Admitting: Physician Assistant

## 2023-04-15 ENCOUNTER — Other Ambulatory Visit: Payer: Self-pay

## 2023-04-15 MED ORDER — AMIODARONE HCL 200 MG PO TABS: 200.0000 mg | ORAL_TABLET | Freq: Every day | ORAL | 0 refills | Status: DC

## 2023-04-15 NOTE — Telephone Encounter (Signed)
*  STAT* If patient is at the pharmacy, call can be transferred to refill team.   1. Which medications need to be refilled? (please list name of each medication and dose if known) amiodarone (PACERONE) 200 MG tablet    2. Would you like to learn more about the convenience, safety, & potential cost savings by using the [facility]?   3. Are you open to using the [facility] (Type [facility].  ).   4. Which pharmacy/location (including street and city if local pharmacy) is medication to be sent to? [id number] - [place], [place] - [address]    5. Do they need a 30 day or 90 day supply? 90 day

## 2023-04-15 NOTE — Telephone Encounter (Signed)
RX already sent to requested Pharmacy

## 2023-07-07 ENCOUNTER — Other Ambulatory Visit: Payer: Self-pay | Admitting: Cardiology

## 2023-07-09 ENCOUNTER — Telehealth: Payer: Self-pay | Admitting: Cardiology

## 2023-07-09 NOTE — Telephone Encounter (Signed)
 Spoke with the patient who thought that she was scheduled for a heart cath on 3/17. I advised that she just has an appointment with Dr. Shlomo on 3/17. Advised that per last visit they were waiting until she was more stable and that she needed to be evaluated by Dr. Shlomo before we can get it scheduled. If proceeded they will schedule at that appointment and she will need to hold her Eliquis  for two days prior to the cath. Patient verbalized understanding.

## 2023-07-09 NOTE — Telephone Encounter (Signed)
 Pt c/o medication issue:  1. Name of Medication:     ELIQUIS  5 MG TABS tablet   2. How are you currently taking this medication (dosage and times per day)?   As prescribed  3. Are you having a reaction (difficulty breathing--STAT)?   4. What is your medication issue?   Patient wants to know how many days prior to her catherization she will need to hold her Eliquis .

## 2023-07-14 ENCOUNTER — Other Ambulatory Visit: Payer: Self-pay | Admitting: Urology

## 2023-07-14 DIAGNOSIS — D49512 Neoplasm of unspecified behavior of left kidney: Secondary | ICD-10-CM

## 2023-07-31 ENCOUNTER — Encounter: Payer: Self-pay | Admitting: Cardiology

## 2023-07-31 NOTE — Telephone Encounter (Signed)
Error

## 2023-08-04 LAB — LIPID PANEL
Chol/HDL Ratio: 2.4 ratio (ref 0.0–4.4)
Cholesterol, Total: 128 mg/dL (ref 100–199)
HDL: 53 mg/dL (ref >39)
LDL Chol Calc (NIH): 53 mg/dL (ref 0–99)
Triglycerides: 124 mg/dL (ref 0–149)
VLDL Cholesterol Cal: 22 mg/dL (ref 5–40)

## 2023-08-17 ENCOUNTER — Ambulatory Visit: Payer: Medicare PPO | Attending: Cardiology | Admitting: Cardiology

## 2023-08-17 ENCOUNTER — Encounter: Payer: Self-pay | Admitting: Cardiology

## 2023-08-17 VITALS — BP 132/76 | HR 111 | Ht 69.0 in | Wt 157.0 lb

## 2023-08-17 DIAGNOSIS — I272 Pulmonary hypertension, unspecified: Secondary | ICD-10-CM | POA: Diagnosis not present

## 2023-08-17 DIAGNOSIS — I480 Paroxysmal atrial fibrillation: Secondary | ICD-10-CM | POA: Diagnosis not present

## 2023-08-17 DIAGNOSIS — R55 Syncope and collapse: Secondary | ICD-10-CM

## 2023-08-17 DIAGNOSIS — I6529 Occlusion and stenosis of unspecified carotid artery: Secondary | ICD-10-CM | POA: Diagnosis not present

## 2023-08-17 LAB — ALT: ALT: 12 IU/L (ref 0–32)

## 2023-08-17 LAB — TSH: TSH: 0.672 u[IU]/mL (ref 0.450–4.500)
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
## 2023-08-17 NOTE — Patient Instructions (Signed)
 Medication Instructions:  Your physician recommends that you continue on your current medications as directed. Please refer to the Current Medication list given to you today.  *If you need a refill on your cardiac medications before your next appointment, please call your pharmacy*  Lab Work: TODAY: TSH and ALT  Testing/Procedures: Echocardiogram Your physician has requested that you have an echocardiogram. Echocardiography is a painless test that uses sound waves to create images of your heart. It provides your doctor with information about the size and shape of your heart and how well your heart's chambers and valves are working. This procedure takes approximately one hour. There are no restrictions for this procedure. Please do NOT wear cologne, perfume, aftershave, or lotions (deodorant is allowed). Please arrive 15 minutes prior to your appointment time.  Please note: We ask at that you not bring children with you during ultrasound (echo/ vascular) testing. Due to room size and safety concerns, children are not allowed in the ultrasound rooms during exams. Our front office staff cannot provide observation of children in our lobby area while testing is being conducted. An adult accompanying a patient to their appointment will only be allowed in the ultrasound room at the discretion of the ultrasound technician under special circumstances. We apologize for any inconvenience.  Follow-Up: At [facility], you and your health needs are our priority.  As part of our continuing mission to provide you with exceptional heart care, we have created designated Provider Care Teams.  These Care Teams include your primary Cardiologist (physician) and Advanced Practice Providers (APPs -  Physician Assistants and Nurse Practitioners) who all work together to provide you with the care you need, when you need it.  Your next appointment:   1 year  Provider:   Armanda Magic, MD

## 2023-08-17 NOTE — Addendum Note (Signed)
 Addended by: Frutoso Schatz on: 08/17/2023 10:03 AM   Modules accepted: Orders

## 2023-08-18 ENCOUNTER — Encounter (HOSPITAL_BASED_OUTPATIENT_CLINIC_OR_DEPARTMENT_OTHER): Payer: Self-pay

## 2023-08-19 ENCOUNTER — Encounter: Payer: Self-pay | Admitting: Urology

## 2023-08-24 ENCOUNTER — Other Ambulatory Visit: Payer: Self-pay

## 2023-08-24 DIAGNOSIS — I6523 Occlusion and stenosis of bilateral carotid arteries: Secondary | ICD-10-CM
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
## 2023-09-04 ENCOUNTER — Encounter: Payer: Self-pay | Admitting: Vascular Surgery

## 2023-09-04 ENCOUNTER — Ambulatory Visit (HOSPITAL_COMMUNITY)
Admission: RE | Admit: 2023-09-04 | Discharge: 2023-09-04 | Disposition: A | Discharge status: Home / Self Care | Payer: Medicare PPO | Source: Ambulatory Visit | Attending: Vascular Surgery | Admitting: Vascular Surgery

## 2023-09-04 ENCOUNTER — Ambulatory Visit: Payer: Medicare PPO | Admitting: Vascular Surgery

## 2023-09-04 VITALS — BP 129/79 | HR 56 | Temp 97.8°F | Ht 69.0 in | Wt 155.0 lb

## 2023-09-04 DIAGNOSIS — I6521 Occlusion and stenosis of right carotid artery: Secondary | ICD-10-CM | POA: Diagnosis not present

## 2023-09-04 DIAGNOSIS — I6523 Occlusion and stenosis of bilateral carotid arteries: Secondary | ICD-10-CM | POA: Insufficient documentation

## 2023-09-07 ENCOUNTER — Ambulatory Visit
Admission: RE | Admit: 2023-09-07 | Discharge: 2023-09-07 | Disposition: A | Discharge status: Home / Self Care | Payer: Medicare PPO | Source: Ambulatory Visit | Attending: Urology

## 2023-09-07 DIAGNOSIS — D49512 Neoplasm of unspecified behavior of left kidney: Secondary | ICD-10-CM

## 2023-09-07 MED ORDER — GADOPICLENOL 0.5 MMOL/ML IV SOLN
7.5000 mL | Freq: Once | INTRAVENOUS | Status: AC | PRN
  Administered 2023-09-07: 7.5 mL via INTRAVENOUS

## 2023-09-08 ENCOUNTER — Ambulatory Visit (HOSPITAL_COMMUNITY): Attending: Cardiology

## 2023-09-08 ENCOUNTER — Encounter: Payer: Self-pay | Admitting: Cardiology

## 2023-09-08 DIAGNOSIS — R55 Syncope and collapse: Secondary | ICD-10-CM | POA: Diagnosis present

## 2023-09-08 DIAGNOSIS — I272 Pulmonary hypertension, unspecified: Secondary | ICD-10-CM | POA: Diagnosis not present

## 2023-09-08 DIAGNOSIS — I480 Paroxysmal atrial fibrillation: Secondary | ICD-10-CM

## 2023-09-08 DIAGNOSIS — I6529 Occlusion and stenosis of unspecified carotid artery: Secondary | ICD-10-CM | POA: Insufficient documentation

## 2023-09-08 DIAGNOSIS — Q2112 Patent foramen ovale: Secondary | ICD-10-CM | POA: Insufficient documentation

## 2023-09-08 LAB — ECHOCARDIOGRAM COMPLETE
Area-P 1/2: 3.77 cm2
S' Lateral: 3.1 cm

## 2023-09-10 ENCOUNTER — Encounter: Payer: Self-pay | Admitting: Cardiology

## 2023-11-23 ENCOUNTER — Emergency Department (HOSPITAL_COMMUNITY)

## 2023-11-23 ENCOUNTER — Other Ambulatory Visit: Payer: Self-pay

## 2023-11-23 ENCOUNTER — Emergency Department (HOSPITAL_COMMUNITY)
Admission: EM | Admit: 2023-11-23 | Discharge: 2023-11-23 | Disposition: A | Discharge status: Home / Self Care | Attending: Emergency Medicine | Admitting: Emergency Medicine

## 2023-11-23 ENCOUNTER — Encounter (HOSPITAL_COMMUNITY): Payer: Self-pay

## 2023-11-23 DIAGNOSIS — J449 Chronic obstructive pulmonary disease, unspecified: Secondary | ICD-10-CM | POA: Diagnosis not present

## 2023-11-23 DIAGNOSIS — S60812A Abrasion of left wrist, initial encounter: Secondary | ICD-10-CM | POA: Insufficient documentation

## 2023-11-23 DIAGNOSIS — Z7901 Long term (current) use of anticoagulants: Secondary | ICD-10-CM | POA: Diagnosis not present

## 2023-11-23 DIAGNOSIS — S8011XA Contusion of right lower leg, initial encounter: Secondary | ICD-10-CM | POA: Diagnosis not present

## 2023-11-23 DIAGNOSIS — N2889 Other specified disorders of kidney and ureter: Secondary | ICD-10-CM | POA: Insufficient documentation

## 2023-11-23 DIAGNOSIS — Z85038 Personal history of other malignant neoplasm of large intestine: Secondary | ICD-10-CM | POA: Insufficient documentation

## 2023-11-23 DIAGNOSIS — S8010XA Contusion of unspecified lower leg, initial encounter: Secondary | ICD-10-CM

## 2023-11-23 DIAGNOSIS — Y92410 Unspecified street and highway as the place of occurrence of the external cause: Secondary | ICD-10-CM | POA: Diagnosis not present

## 2023-11-23 DIAGNOSIS — Z23 Encounter for immunization: Secondary | ICD-10-CM | POA: Insufficient documentation

## 2023-11-23 DIAGNOSIS — S61012A Laceration without foreign body of left thumb without damage to nail, initial encounter: Secondary | ICD-10-CM | POA: Insufficient documentation

## 2023-11-23 DIAGNOSIS — R55 Syncope and collapse: Secondary | ICD-10-CM | POA: Diagnosis not present

## 2023-11-23 DIAGNOSIS — S8012XA Contusion of left lower leg, initial encounter: Secondary | ICD-10-CM | POA: Insufficient documentation

## 2023-11-23 DIAGNOSIS — S41111A Laceration without foreign body of right upper arm, initial encounter: Secondary | ICD-10-CM | POA: Diagnosis not present

## 2023-11-23 DIAGNOSIS — Z79899 Other long term (current) drug therapy: Secondary | ICD-10-CM | POA: Diagnosis not present

## 2023-11-23 DIAGNOSIS — T148XXA Other injury of unspecified body region, initial encounter: Secondary | ICD-10-CM

## 2023-11-23 LAB — COMPREHENSIVE METABOLIC PANEL WITH GFR
ALT: 13 U/L (ref 0–44)
AST: 19 U/L (ref 15–41)
Albumin: 3 g/dL — ABNORMAL LOW (ref 3.5–5.0)
Alkaline Phosphatase: 48 U/L (ref 38–126)
Anion gap: 11 (ref 5–15)
BUN: 18 mg/dL (ref 8–23)
CO2: 22 mmol/L (ref 22–32)
Calcium: 8.6 mg/dL — ABNORMAL LOW (ref 8.9–10.3)
Chloride: 108 mmol/L (ref 98–111)
Creatinine, Ser: 1.05 mg/dL — ABNORMAL HIGH (ref 0.44–1.00)
GFR, Estimated: 55 mL/min — ABNORMAL LOW (ref >60)
Glucose, Bld: 107 mg/dL — ABNORMAL HIGH (ref 70–99)
Potassium: 4.1 mmol/L (ref 3.5–5.1)
Sodium: 141 mmol/L (ref 135–145)
Total Bilirubin: 0.6 mg/dL (ref 0.0–1.2)
Total Protein: 5.6 g/dL — ABNORMAL LOW (ref 6.5–8.1)

## 2023-11-23 LAB — SAMPLE TO BLOOD BANK

## 2023-11-23 LAB — URINALYSIS, ROUTINE W REFLEX MICROSCOPIC
Bilirubin Urine: NEGATIVE
Glucose, UA: NEGATIVE mg/dL
Ketones, ur: NEGATIVE mg/dL
Leukocytes,Ua: NEGATIVE
Nitrite: NEGATIVE
Protein, ur: NEGATIVE mg/dL
Specific Gravity, Urine: 1.015 (ref 1.005–1.030)
pH: 6 (ref 5.0–8.0)

## 2023-11-23 LAB — CBC
HCT: 40.3 % (ref 36.0–46.0)
Hemoglobin: 13.3 g/dL (ref 12.0–15.0)
MCH: 30 pg (ref 26.0–34.0)
MCHC: 33 g/dL (ref 30.0–36.0)
MCV: 91 fL (ref 80.0–100.0)
Platelets: 233 10*3/uL (ref 150–400)
RBC: 4.43 MIL/uL (ref 3.87–5.11)
RDW: 12 % (ref 11.5–15.5)
WBC: 10 10*3/uL (ref 4.0–10.5)
nRBC: 0 % (ref 0.0–0.2)

## 2023-11-23 LAB — I-STAT CHEM 8, ED
BUN: 25 mg/dL — ABNORMAL HIGH (ref 8–23)
Calcium, Ion: 1.1 mmol/L — ABNORMAL LOW (ref 1.15–1.40)
Chloride: 110 mmol/L (ref 98–111)
Creatinine, Ser: 1 mg/dL (ref 0.44–1.00)
Glucose, Bld: 112 mg/dL — ABNORMAL HIGH (ref 70–99)
HCT: 37 % (ref 36.0–46.0)
Hemoglobin: 12.6 g/dL (ref 12.0–15.0)
Potassium: 4.3 mmol/L (ref 3.5–5.1)
Sodium: 141 mmol/L (ref 135–145)
TCO2: 21 mmol/L — ABNORMAL LOW (ref 22–32)

## 2023-11-23 LAB — PROTIME-INR
INR: 1.4 — ABNORMAL HIGH (ref 0.8–1.2)
Prothrombin Time: 17 s — ABNORMAL HIGH (ref 11.4–15.2)

## 2023-11-23 LAB — I-STAT CG4 LACTIC ACID, ED: Lactic Acid, Venous: 0.9 mmol/L (ref 0.5–1.9)

## 2023-11-23 LAB — ETHANOL: Alcohol, Ethyl (B): <15 mg/dL (ref <15)

## 2023-11-23 MED ORDER — FENTANYL CITRATE PF 50 MCG/ML IJ SOSY
50.0000 ug | PREFILLED_SYRINGE | Freq: Once | INTRAMUSCULAR | Status: AC
  Administered 2023-11-23: 50 ug via INTRAVENOUS
  Filled 2023-11-23: qty 1

## 2023-11-23 MED ORDER — TETANUS-DIPHTH-ACELL PERTUSSIS 5-2.5-18.5 LF-MCG/0.5 IM SUSY
0.5000 mL | PREFILLED_SYRINGE | Freq: Once | INTRAMUSCULAR | Status: AC
  Administered 2023-11-23: 0.5 mL via INTRAMUSCULAR

## 2023-11-23 MED ORDER — IOHEXOL 350 MG/ML SOLN
75.0000 mL | Freq: Once | INTRAVENOUS | Status: AC | PRN
  Administered 2023-11-23: 75 mL via INTRAVENOUS
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
## 2023-11-23 NOTE — Discharge Instructions (Addendum)
 Do not drive until cleared by your neurologist.  Make an appointment to follow-up with your neurologist.  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  Please contact your oncologist regarding this.  Keep your legs elevated.  Apply ice to the area to help with the swelling.  Keep your wounds clean and dry.  Return to the emergency room for any worsening symptoms.
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
## 2023-11-23 NOTE — ED Notes (Signed)
 CCMD called and notified
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
## 2023-11-23 NOTE — ED Notes (Signed)
 Patient verbalizes understanding of discharge instructions. Opportunity for questioning and answers were provided. Pt discharged from ED.
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
## 2023-11-23 NOTE — ED Notes (Addendum)
 Kelly [name]
